# Patient Record
Sex: Male | Born: 1995 | Race: White | Hispanic: No | Marital: Single
Health system: Midwestern US, Community
[De-identification: ages and names within clinical notes are randomized; demographics above are authoritative.]

## PROBLEM LIST (undated history)

## (undated) ENCOUNTER — Emergency Department (HOSPITAL_COMMUNITY): Payer: Self-pay | Source: Home / Self Care

## (undated) DIAGNOSIS — Z87442 Personal history of urinary calculi: Secondary | ICD-10-CM

## (undated) DIAGNOSIS — F1290 Cannabis use, unspecified, uncomplicated: Secondary | ICD-10-CM

## (undated) DIAGNOSIS — M419 Scoliosis, unspecified: Secondary | ICD-10-CM

## (undated) DIAGNOSIS — N342 Other urethritis: Secondary | ICD-10-CM

## (undated) DIAGNOSIS — Q059 Spina bifida, unspecified: Secondary | ICD-10-CM

## (undated) HISTORY — PX: BLADDER SURGERY: SHX569

## (undated) HISTORY — PX: BACK SURGERY: SHX140

---
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
## 2013-10-15 ENCOUNTER — Emergency Department (HOSPITAL_COMMUNITY)
Admission: EM | Admit: 2013-10-15 | Discharge: 2013-10-15 | Disposition: A | Discharge status: Home / Self Care | Payer: 59 | Attending: Emergency Medicine | Admitting: Emergency Medicine

## 2013-10-15 ENCOUNTER — Encounter (HOSPITAL_COMMUNITY): Payer: Self-pay | Admitting: Emergency Medicine

## 2013-10-15 DIAGNOSIS — Z91040 Latex allergy status: Secondary | ICD-10-CM | POA: Insufficient documentation

## 2013-10-15 DIAGNOSIS — Z792 Long term (current) use of antibiotics: Secondary | ICD-10-CM | POA: Insufficient documentation

## 2013-10-15 DIAGNOSIS — Z8739 Personal history of other diseases of the musculoskeletal system and connective tissue: Secondary | ICD-10-CM | POA: Insufficient documentation

## 2013-10-15 DIAGNOSIS — M549 Dorsalgia, unspecified: Secondary | ICD-10-CM | POA: Insufficient documentation

## 2013-10-15 DIAGNOSIS — R1084 Generalized abdominal pain: Secondary | ICD-10-CM

## 2013-10-15 DIAGNOSIS — Q059 Spina bifida, unspecified: Secondary | ICD-10-CM | POA: Insufficient documentation

## 2013-10-15 HISTORY — DX: Scoliosis, unspecified: M41.9

## 2013-10-15 LAB — CBC WITH DIFFERENTIAL/PLATELET
Basophils Absolute: 0.1 10*3/uL (ref 0.0–0.1)
Basophils Relative: 1 % (ref 0–1)
EOS ABS: 0.2 10*3/uL (ref 0.0–0.7)
Eosinophils Relative: 3 % (ref 0–5)
HCT: 45.1 % (ref 39.0–52.0)
HEMOGLOBIN: 15.4 g/dL (ref 13.0–17.0)
LYMPHS ABS: 2.1 10*3/uL (ref 0.7–4.0)
LYMPHS PCT: 22 % (ref 12–46)
MCH: 31.4 pg (ref 26.0–34.0)
MCHC: 34.1 g/dL (ref 30.0–36.0)
MCV: 92 fL (ref 78.0–100.0)
MONOS PCT: 9 % (ref 3–12)
Monocytes Absolute: 0.8 10*3/uL (ref 0.1–1.0)
NEUTROS PCT: 65 % (ref 43–77)
Neutro Abs: 6.2 10*3/uL (ref 1.7–7.7)
Platelets: 217 10*3/uL (ref 150–400)
RBC: 4.9 MIL/uL (ref 4.22–5.81)
RDW: 13.1 % (ref 11.5–15.5)
WBC: 9.4 10*3/uL (ref 4.0–10.5)

## 2013-10-15 LAB — COMPREHENSIVE METABOLIC PANEL
ALT: 11 U/L (ref 0–53)
AST: 14 U/L (ref 0–37)
Albumin: 4.4 g/dL (ref 3.5–5.2)
Alkaline Phosphatase: 78 U/L (ref 39–117)
Anion gap: 12 (ref 5–15)
BUN: 13 mg/dL (ref 6–23)
CALCIUM: 9.7 mg/dL (ref 8.4–10.5)
CO2: 26 mEq/L (ref 19–32)
Chloride: 104 mEq/L (ref 96–112)
Creatinine, Ser: 1.16 mg/dL (ref 0.50–1.35)
GFR calc non Af Amer: >90 mL/min (ref >90)
Glucose, Bld: 91 mg/dL (ref 70–99)
Potassium: 4.6 mEq/L (ref 3.7–5.3)
SODIUM: 142 meq/L (ref 137–147)
TOTAL PROTEIN: 7.4 g/dL (ref 6.0–8.3)
Total Bilirubin: 0.9 mg/dL (ref 0.3–1.2)

## 2013-10-15 LAB — LIPASE, BLOOD: Lipase: 20 U/L (ref 11–59)

## 2013-10-15 NOTE — ED Provider Notes (Signed)
CSN: [id number]     Arrival date & time 10/15/13  1256 History   First MD Initiated Contact with Patient 10/15/13 1504     Chief Complaint  Patient presents with  . Abdominal Pain  . Back Pain     (Consider location/radiation/quality/duration/timing/severity/associated sxs/prior Treatment) HPI Comments: Patient here complaining of ongoing abdominal cramping is worse in the morning. Patient states that he has been eating and increased greasy meal. This may be the cause. No fever or chills. No vomiting or diarrhea. Symptoms have been for a week he associates this with his new job. Denies any urinary symptoms. Does not take any medication for this. Has no symptoms currently. Feels at his baseline  Patient is a 18 y.o. male presenting with abdominal pain and back pain. The history is provided by the patient.  Abdominal Pain Back Pain Associated symptoms: abdominal pain     Past Medical History  Diagnosis Date  . Spina bifida   . Scoliosis    History reviewed. No pertinent past surgical history. No family history on file. History  Substance Use Topics  . Smoking status: Never Smoker   . Smokeless tobacco: Not on file  . Alcohol Use: No    Review of Systems  Gastrointestinal: Positive for abdominal pain.  Musculoskeletal: Positive for back pain.  All other systems reviewed and are negative.     Allergies  Latex  Home Medications   Prior to Admission medications   Medication Sig Start Date End Date Taking? Authorizing Provider  amoxicillin-clavulanate (AUGMENTIN) 500-125 MG per tablet Take 1 tablet by mouth daily.    Historical Provider, MD   BP 117/71  Pulse 75  Temp(Src) 98 F (36.7 C) (Oral)  Resp 20  SpO2 98% Physical Exam  Nursing note and vitals reviewed. Constitutional: He is oriented to person, place, and time. He appears well-developed and well-nourished.  Non-toxic appearance. No distress.  HENT:  Head: Normocephalic and atraumatic.  Eyes: Conjunctivae,  EOM and lids are normal. Pupils are equal, round, and reactive to light.  Neck: Normal range of motion. Neck supple. No tracheal deviation present. No mass present.  Cardiovascular: Normal rate, regular rhythm and normal heart sounds.  Exam reveals no gallop.   No murmur heard. Pulmonary/Chest: Effort normal and breath sounds normal. No stridor. No respiratory distress. He has no decreased breath sounds. He has no wheezes. He has no rhonchi. He has no rales.  Abdominal: Soft. Normal appearance and bowel sounds are normal. He exhibits no distension. There is no tenderness. There is no rebound and no CVA tenderness.  Musculoskeletal: Normal range of motion. He exhibits no edema and no tenderness.  Neurological: He is alert and oriented to person, place, and time. He has normal strength. No cranial nerve deficit or sensory deficit. GCS eye subscore is 4. GCS verbal subscore is 5. GCS motor subscore is 6.  Skin: Skin is warm and dry. No abrasion and no rash noted.  Psychiatric: He has a normal mood and affect. His speech is normal and behavior is normal.    ED Course  Procedures (including critical care time) Labs Review Labs Reviewed  COMPREHENSIVE METABOLIC PANEL  CBC WITH DIFFERENTIAL  LIPASE, BLOOD  URINALYSIS, ROUTINE W REFLEX MICROSCOPIC    Imaging Review No results found.   EKG Interpretation None      MDM   Final diagnoses:  None    Patient with likely GERD in stable for discharge    Toy Baker, MD 10/15/13 1524

## 2013-10-15 NOTE — Discharge Instructions (Signed)

## 2013-10-15 NOTE — ED Notes (Signed)
Pt c/o back pain x 1 month and abd pain x 2 days. Pt c/o nausea but no vomiting.

## 2013-11-08 ENCOUNTER — Encounter (HOSPITAL_COMMUNITY): Payer: Self-pay | Admitting: Emergency Medicine

## 2013-11-08 ENCOUNTER — Telehealth (HOSPITAL_BASED_OUTPATIENT_CLINIC_OR_DEPARTMENT_OTHER): Payer: Self-pay | Admitting: Emergency Medicine

## 2013-11-08 ENCOUNTER — Emergency Department (HOSPITAL_COMMUNITY)
Admission: EM | Admit: 2013-11-08 | Discharge: 2013-11-08 | Disposition: A | Discharge status: Home / Self Care | Payer: 59 | Attending: Emergency Medicine | Admitting: Emergency Medicine

## 2013-11-08 DIAGNOSIS — N390 Urinary tract infection, site not specified: Secondary | ICD-10-CM | POA: Insufficient documentation

## 2013-11-08 DIAGNOSIS — Z91040 Latex allergy status: Secondary | ICD-10-CM | POA: Insufficient documentation

## 2013-11-08 DIAGNOSIS — Z8739 Personal history of other diseases of the musculoskeletal system and connective tissue: Secondary | ICD-10-CM | POA: Insufficient documentation

## 2013-11-08 DIAGNOSIS — R300 Dysuria: Secondary | ICD-10-CM | POA: Insufficient documentation

## 2013-11-08 DIAGNOSIS — Q059 Spina bifida, unspecified: Secondary | ICD-10-CM

## 2013-11-08 DIAGNOSIS — T83511A Infection and inflammatory reaction due to indwelling urethral catheter, initial encounter: Secondary | ICD-10-CM

## 2013-11-08 LAB — URINALYSIS, ROUTINE W REFLEX MICROSCOPIC
Bilirubin Urine: NEGATIVE
GLUCOSE, UA: NEGATIVE mg/dL
Hgb urine dipstick: NEGATIVE
KETONES UR: NEGATIVE mg/dL
Nitrite: POSITIVE — AB
PH: 6.5 (ref 5.0–8.0)
Protein, ur: NEGATIVE mg/dL
SPECIFIC GRAVITY, URINE: 1.023 (ref 1.005–1.030)
Urobilinogen, UA: 1 mg/dL (ref 0.0–1.0)

## 2013-11-08 LAB — URINE MICROSCOPIC-ADD ON

## 2013-11-08 MED ORDER — CEPHALEXIN 500 MG PO CAPS: 500.0000 mg | ORAL_CAPSULE | Freq: Three times a day (TID) | ORAL | Status: DC

## 2013-11-08 MED ORDER — CATHETERS KIT: PACK | Status: DC

## 2013-11-08 NOTE — ED Provider Notes (Signed)
Medical screening examination/treatment/procedure(s) were performed by non-physician practitioner and as supervising physician I was immediately available for consultation/collaboration.   EKG Interpretation None      Devoria Albe, MD, Armando Gang   Ward Givens, MD 11/08/13 2259

## 2013-11-08 NOTE — Discharge Instructions (Signed)
Catheter-Associated Urinary Tract Infection FAQs °WHAT IS "CATHETER-ASSOCIATED" URINARY TRACT INFECTION? °A urinary tract infection (also called "UTI") is an infection in the urinary system, which includes the bladder (which stores the urine) and the kidneys (which filter the blood to make urine). Germs (for example, bacteria or yeasts) do not normally live in these areas; but if germs are introduced, an infection can occur. If you have a urinary catheter, germs can travel along the catheter and cause an infection in your bladder or your kidney; in that case it is called a catheter-associated urinary tract infection (or "CA-UTI").  °WHAT IS A URINARY CATHETER? °A urinary catheter is a thin tube placed in the bladder to drain urine. Urine drains through the tube into a bag that collects the urine. A urinary  °catheter may be used: °· If you are not able to urinate on your own. °· To measure the amount of urine that you make, for example, during intensive care. °· During and after some types of surgery. °· During some tests of the kidneys and bladder . °People with urinary catheters have a much higher chance of getting a urinary tract infection than people who don't have a catheter. °HOW DO I GET A CATHETER-ASSOCIATED URINARY TRACT INFECTION (CA-UTI)? °If germs enter the urinary tract, they may cause an infection. Many of the germs that cause a catheter-associated urinary tract infection are common germs found in your intestines that do not usually cause an infection there. Germs can enter the urinary tract when the catheter is being put in or while the catheter remains in the bladder.  °WHAT ARE THE SYMPTOMS OF A URINARY TRACT INFECTION?  °Some of the common symptoms of a urinary tract infection are: °· Burning or pain in the lower abdomen (that is, below the stomach). °· Fever. °· Bloody urine may be a sign of infection, but is also caused by other problems . °· Burning during urination or an increase in the  frequency of urination after the catheter is removed. °Sometimes people with catheter-associated urinary tract infections do not have these symptoms of infection. °CAN CATHETER-ASSOCIATED URINARY TRACT INFECTIONS BE TREATED? °Yes, most catheter-associated urinary tract infections can be treated with antibiotics and removal or change of the catheter. Your doctor will determine which antibiotic is best for you.  °WHAT ARE SOME OF THE THINGS THAT HOSPITALS ARE DOING TO PREVENT CATHETER-ASSOCIATED URINARY TRACT INFECTIONS? °To prevent urinary tract infections, doctors and nurses take the following actions.  °Catheter insertion °· Catheters are put in only when necessary and they are removed as soon as possible. °· Only properly trained persons insert catheters using sterile ("clean") technique. °· The skin in the area where the catheter will be inserted is cleaned before inserting the catheter. °· Other methods to drain the urine are sometimes used, such as: °¨ External catheters in men (these look like condoms and are placed over the penis rather than into the penis) °¨ Putting a temporary catheter in to drain the urine and removing it right away. This is called intermittent urethral catheterization. °Catheter care °· Healthcare providers clean their hands by washing them with soap and water or using an alcohol-based hand rub before and after touching your catheter. °¨ If you do not see your providers clean their hands, please ask them to do so. °· Avoid disconnecting the catheter and drain tube. This helps to prevent germs from getting into the catheter tube. °· The catheter is secured to the leg to prevent pulling on the   catheter.  Avoid twisting or kinking the catheter.  Keep the bag lower than the bladder to prevent urine from backflowing to the bladder.  Empty the bag regularly. The drainage spout should not touch anything while emptying the bag. WHAT CAN I DO TO HELP PREVENT CATHETER-ASSOCIATED URINARY  TRACT INFECTIONS IF I HAVE A CATHETER?  Always clean your hands before and after doing catheter care.  Always keep your urine bag below the level of your bladder.  Do not tug or pull on the tubing.  Do not twist or kink the catheter tubing.  Ask your healthcare provider each day if you still need the catheter. WHAT DO I NEED TO DO WHEN I GO HOME FROM THE HOSPITAL?  If you will be going home with a catheter, your doctor or nurse should explain everything you need to know about taking care of the catheter. Make sure you understand how to care for it before you leave the hospital.  If you develop any of the symptoms of a urinary tract infection, such as burning or pain in the lower abdomen, fever, or an increase in the frequency of urination, contact your doctor or nurse immediately.  Before you go home, make sure you know who to contact if you have questions or problems after you get home. If you have questions, please ask your doctor or nurse. Developed and co-sponsored by [employer] for [employer] of [place] [phone]); Infectious Diseases Society of America (IDSA); The [facility] Association; Association for Professionals in Infection Control and Epidemiology (APIC); Center for Disease Control (CDC); and The Joint Commission Document Released: 10/28/2011 Document Reviewed: 10/28/2011 [facility] Patient Information 2015 [place], [place]. This information is not intended to replace advice given to you by your health care provider. Make sure you discuss any questions you have with your health care provider.   Emergency Department Resource Guide 1) Find a Doctor and Pay Out of Pocket Although you won't have to find out who is covered by your insurance plan, it is a good idea to ask around and get recommendations. You will then need to call the office and see if the doctor you have chosen will accept you as a new patient and what types of options they offer for patients who are  self-pay. Some doctors offer discounts or will set up payment plans for their patients who do not have insurance, but you will need to ask so you aren't surprised when you get to your appointment.  2) Contact Your Local Health Department Not all health departments have doctors that can see patients for sick visits, but many do, so it is worth a call to see if yours does. If you don't know where your local health department is, you can check in your phone book. The CDC also has a tool to help you locate your state's health department, and many state websites also have listings of all of their local health departments.  3) Find a Walk-in Clinic If your illness is not likely to be very severe or complicated, you may want to try a walk in clinic. These are popping up all over the country in pharmacies, drugstores, and shopping centers. They're usually staffed by nurse practitioners or physician assistants that have been trained to treat common illnesses and complaints. They're usually fairly quick and inexpensive. However, if you have serious medical issues or chronic medical problems, these are probably not your best option.  No Primary Care Doctor: - Call Health Connect at  [phone] -  they can help you locate a primary care doctor that  accepts your insurance, provides certain services, etc. - Physician Referral Service- [phone]  Chronic Pain Problems: Organization         Address  Phone   Notes  [facility]  [phone] Patients need to be referred by their primary care doctor.   Medication Assistance: Organization         Address  Phone   Notes  [facility] Medication [facility] [address] [place] [place], [place] [address] [phone] --Must be a resident of [facility] -- Must have NO insurance coverage whatsoever (no Medicaid/ Medicare, etc.) -- The pt. MUST have a primary care doctor that directs their care regularly and follows them in  the community   [facility]  [phone]   [employer]  [phone]    Agencies that provide inexpensive medical care: Organization         Address  Phone   Notes  [facility]  [phone]   [facility]    [phone]   [facility] [address] [place], [place] [address] [phone]   [facility] [address], [place] [phone]   [facility]anned Parenthood    [phone]   [facility]    [phone]   [facility] and [facility]  [address], [place] Phone:  [phone], Fax:  [phone] Hours of Operation:  9 am - 6 pm, M-F.  Also accepts Medicaid/Medicare and self-pay.  [facility]  [address], [place] Phone: [phone], Fax: [phone]. Hours of Operation:  8:30 am - 5:30 pm, M-F.  Also accepts Medicaid and self-pay.  [facility] [place] [address] [place] Phone: [phone]   [facility] [address] Natasha Bence [place], [place] [phone], Ext. [phone] Mondays & Thursdays: 7-9 AM.  First 15 patients are seen on a first come, first serve basis.    Medicaid-accepting [facility] Providers:  Organization         Address  Phone   Notes  [facility] [address], [place] [phone] Also accepts self-pay patients.  [facility] [address] Laurell Josephs [place], [place]  [phone]   [facility] [address], [place] [phone]   [facility] [address] [place] [phone]   Renaye Rakers [address], [place]   [phone] Only accepts [place] Access [place] patients after they have their name applied to their card.   Self-Pay (no insurance) in [facility]:  Organization         Address  Phone   Notes  Sickle Cell Patients,  [facility] [address] [place], [place] [phone]   [facility] [address] [place], [place] [phone]   [facility] [place]  [address], [place] [phone]   [facility]/Dr. Osei-Bonsu  [address], [place] or [address], [place] [phone] Phone number for both [place] and [place] locations is the same.  [facility] [address] [place] [phone]   [facility] [address] [place] or [place]  [facility] [address] [phone]   [facility] [address] [place], [place] [phone], phone; [phone], fax Sees patients 1st and 3rd Saturday of every month.  Must not qualify for public or private insurance (i.e. Medicaid, Medicare, [place] Health Choice, Veterans' Benefits)  Household income should be no more than 200% of the poverty level The clinic cannot treat you if you are pregnant or think you are pregnant  Sexually transmitted diseases are not treated at the clinic.    Dental Care: Organization         Address  Phone  Notes  [facility] [address] [place], [place] [phone] Accepts children up to age 43 who are enrolled in [place] or [employer] Health Choice; pregnant women with a Medicaid card; and children who have applied for Medicaid or [place] Health Choice, but were declined, whose parents can pay a reduced fee at time of service.  [facility]  [address], [place] [phone] Accepts children up to age 31 who are enrolled in [place] or [place] Health Choice; pregnant women with a Medicaid card; and children who have applied for Medicaid or  Health Choice, but were declined, whose parents can pay a reduced fee at time of service.  [facility]   [address] [place], [place] [phone] Patients are seen by appointment only. Walk-ins are not accepted. [facility] will see patients 40 years of age and older. Monday - Tuesday (8am-5pm) Most Wednesdays (8:30-5pm) $30 per visit, cash only  [facility] Adult Dental Access PRO[facility]  [address], [facility] [phone] Patients are seen by appointment only. Walk-ins are not accepted. [facility] will see patients 69 years of age and older. One Wednesday Evening (Monthly: Volunteer Based).  $30 per visit, cash only  [employer]  [phone] for adults; Children under age 76, call Graduate Pediatric Dentistry at [phone]. Children aged 49-14, please call [phone] to request a pediatric application.  Dental services are provided in all areas of dental care including fillings, crowns and bridges, complete and partial dentures, implants, gum treatment, root canals, and extractions. Preventive care is also provided. Treatment is provided to both adults and children. Patients are selected via a lottery and there is often a waiting list.   [facility] [address], [place]  [phone] [email or web address]   [facility] [address] [place], [place] [phone], Ext. [phone] Second and Fourth Thursday of each month, opens at 6:30 AM; Clinic ends at 9 AM.  Patients are seen on a first-come first-served basis, and a limited number are seen during each clinic.   [facility]  [address] Ether Griffins [place], [place] [phone]   Eligibility Requirements You must have lived in [place], [place], or [place] counties for at least the last three months.   You cannot be eligible for state or federal sponsored [place], including [employer], [place], or [employer].   You generally cannot be eligible for healthcare insurance through your employer.    How to apply: Eligibility screenings are  held every Tuesday and Wednesday afternoon from 1:00 pm until 4:00 pm. You do not need an appointment for the interview!  [facility] [address] [place], [place] [phone]   [facility] Health Department  [phone]   [facility]  Health Department  [phone]   [facility] Health Department  [phone]    Behavioral Health Resources in the Community: Intensive Outpatient Programs Organization         Address  Phone  Notes  [facility] Services [address], [place], [place] [phone]   [facility] Outpatient [address], [place], [place] [phone]   [facility] [address] [place], [place]  [phone]   [facility] Mental Health [address],  [place], [place] [phone] or [phone]   Substance Abuse Resources Organization         Address  Phone  Notes  [facility] Drug Services  [phone]   [facility]  [phone]   [facility]  [phone]   Floydene Flock  [phone]   Residential & Outpatient Substance Abuse Program  [phone]   Psychological Services Organization         Address  Phone  Notes  [facility] Behavioral Health  [phone]   [facility] Services  [phone]   [facility] Mental Health [address], [place] [phone] or [phone]    Mobile Crisis Teams Organization         Address  Phone  Notes  [facility], Mobile Crisis Care Unit  [phone]   [facility]  [address] [place], [place] [phone]   Doristine Locks [address] [place] [phone]    Self-Help/Support Groups Organization         Address  Phone             Notes  [facility] [place] - variety of support groups  [phone]- I7437963 Call for more information  Narcotics Anonymous (NA), [facility] [address], [employer] [place]  2 meetings at this location     Statistician         Address  Phone  Notes  [facility] [address],    [place]  [phone]   [facility]  [address], [place] [address], [place], [place] [phone]   [facility] Treatment Facility [address] [place], [place] [phone] Admissions: 8am-3pm M-F  [facility] [address]    [place], [place] [phone]   [facility] [address] [place], [place], [place] [phone]   [facility] [address],  [place], [place] [phone]   [facility] - Intensive Outpatient [address] Laurell Josephs [address], [place], [place] [phone]   [facility] ([facility]iction [facility] [address] [place].,  [place], [place] [phone] or [phone]   [facility] ([facility]) [address] [place], [place] [phone] Accepts Medicaid  [facility] [place] [address]  [place] [phone] Substance Abuse/Addiction Treatment   [facility] Organization         Address  Phone  Notes  [facility]  [phone]   Angie Fava, PhD [address] Ervin Knack [place], [place]   [phone] or [phone]   [facility] Behavioral   [address] [place], [place] [phone]   [facility] [address] [place], [place] [phone] Insurance/Medicaid/sponsorship through [employer] and Families [address]  [place], [place] [phone] [place], [place] [phone]    Dr. Adele Schilder  [phone]   [facility] Dept. 1) [address], [place] 2) [place] 3)  [place] [address], [place] [phone]  [phone]   [place] [phone] or [phone] (After  Hours)

## 2013-11-08 NOTE — ED Notes (Signed)
Pt states that he self caths and he lost his last catheter.  Needs size 12 F catheter.

## 2013-11-08 NOTE — ED Provider Notes (Signed)
CSN: [id number]     Arrival date & time 11/08/13  1001 History   First MD Initiated Contact with Patient 11/08/13 1021     Chief Complaint  Patient presents with  . Needs a Cath    . Dysuria   HPI  Patient is a 18 y.o. Male with Spina Bifida who presents to the ED with need for catheterization kit.  Per the patient he has been self cathing for as long as he can remember due to his spina bifida.  Patient states that he ran out of catheters and did not have any more and that is why he had to come here.  Patient states that he has been reusing catheters for the past couple weeks because he was out.  Patient thinks that he has a UTI because he has a very foul odor to his urine and he states that it is cloudy and much thicker than normal.  Patient states that he gets UTIs often.    Past Medical History  Diagnosis Date  . Spina bifida   . Scoliosis    No past surgical history on file. No family history on file. History  Substance Use Topics  . Smoking status: Never Smoker   . Smokeless tobacco: Not on file  . Alcohol Use: No    Review of Systems  Constitutional: Negative for fever, chills and fatigue.  Gastrointestinal: Negative for nausea, vomiting, abdominal pain and diarrhea.  Genitourinary: Negative for urgency, frequency, hematuria, flank pain and difficulty urinating.  Skin: Negative for rash and wound.  All other systems reviewed and are negative.   Allergies  Latex  Home Medications   Prior to Admission medications   Medication Sig Start Date End Date Taking? Authorizing Provider  Catheters KIT Provide 21 self catheterization kits for PRN catheterization 11/08/13   Myrick Mcnairy A Forcucci, PA-C  cephALEXin (KEFLEX) 500 MG capsule Take 1 capsule (500 mg total) by mouth 3 (three) times daily. 11/08/13   Kostantinos Tallman A Forcucci, PA-C   BP 110/54  Pulse 60  Temp(Src) 98 F (36.7 C) (Oral)  Resp 14  SpO2 98% Physical Exam  Nursing note and vitals reviewed. Constitutional: He is  oriented to person, place, and time. He appears well-developed and well-nourished. No distress.  HENT:  Head: Normocephalic and atraumatic.  Mouth/Throat: Oropharynx is clear and moist. No oropharyngeal exudate.  Eyes: Conjunctivae and EOM are normal. Pupils are equal, round, and reactive to light. No scleral icterus.  Neck: Normal range of motion. Neck supple. No JVD present. No thyromegaly present.  Cardiovascular: Normal rate, regular rhythm, normal heart sounds and intact distal pulses.  Exam reveals no gallop and no friction rub.   No murmur heard. Pulmonary/Chest: Effort normal and breath sounds normal. No respiratory distress. He has no wheezes. He has no rales. He exhibits no tenderness.  Abdominal: Soft. Bowel sounds are normal. He exhibits no distension and no mass. There is no tenderness. There is no rebound and no guarding.  Lymphadenopathy:    He has no cervical adenopathy.  Neurological: He is alert and oriented to person, place, and time.  Skin: Skin is warm and dry. He is not diaphoretic.  Psychiatric: He has a normal mood and affect. His behavior is normal. Judgment and thought content normal.    ED Course  Procedures (including critical care time) Labs Review Labs Reviewed  URINALYSIS, ROUTINE W REFLEX MICROSCOPIC - Abnormal; Notable for the following:    APPearance CLOUDY (*)    Nitrite POSITIVE (*)  Leukocytes, UA MODERATE (*)    All other components within normal limits  URINE MICROSCOPIC-ADD ON - Abnormal; Notable for the following:    Bacteria, UA MANY (*)    All other components within normal limits  URINE CULTURE    Imaging Review No results found.   EKG Interpretation None      MDM   Final diagnoses:  Spina bifida  Urinary tract infection associated with catheterization of urinary tract, initial encounter   Patient is a 18 y.o. Male who presents to the ED with need for catheterization kits.  Physical exam unremarkable.  UA shows UTI.  Have  sent urine for culture.  Will place patient on Keflex 500 TID x 7 days.  Patient was also given a prescription for self catheterization kits.  Patient [facility] be [facility] to [facility] community health and wellness.  Patient to return to the ED for Pyelonephritis symptoms.  Patient states understanding and agreement at this time. Patient is stable for discharge.        Cherylann Parr, PA-C 11/08/13 1811

## 2013-11-10 LAB — URINE CULTURE
Colony Count: >=100000
Special Requests: NORMAL

## 2013-11-12 ENCOUNTER — Telehealth (HOSPITAL_BASED_OUTPATIENT_CLINIC_OR_DEPARTMENT_OTHER): Payer: Self-pay

## 2013-11-12 NOTE — Telephone Encounter (Signed)
Post ED Visit - Positive Culture Follow-up  Culture report reviewed by antimicrobial stewardship pharmacist:  Wes Dulaney, Pharm.D., BCPS  Celedonio Miyamoto, Pharm.D., BCPS  Georgina Pillion, [address].D., BCPS  [place], [address].D., BCPS, AAHIVP  Estella Husk, Pharm.D., BCPS, AAHIVP  Carly Sabat, Pharm.D.  Enzo Bi, [address].D.  Positive Urine culture, >/= 100,000 colonies -> Klebsiella Pneumoniae Treated with Cephalexin, organism sensitive to the same and no further patient follow-up is required at this time.  Arvid Right 11/12/2013, 1:19 AM

## 2014-11-29 ENCOUNTER — Encounter (HOSPITAL_COMMUNITY): Payer: Self-pay | Admitting: Emergency Medicine

## 2014-11-29 ENCOUNTER — Emergency Department (HOSPITAL_COMMUNITY)
Admission: EM | Admit: 2014-11-29 | Discharge: 2014-11-29 | Disposition: A | Discharge status: Home / Self Care | Payer: 59 | Attending: Emergency Medicine | Admitting: Emergency Medicine

## 2014-11-29 DIAGNOSIS — M419 Scoliosis, unspecified: Secondary | ICD-10-CM | POA: Insufficient documentation

## 2014-11-29 DIAGNOSIS — Q059 Spina bifida, unspecified: Secondary | ICD-10-CM | POA: Insufficient documentation

## 2014-11-29 DIAGNOSIS — N10 Acute tubulo-interstitial nephritis: Secondary | ICD-10-CM | POA: Insufficient documentation

## 2014-11-29 DIAGNOSIS — Z91040 Latex allergy status: Secondary | ICD-10-CM | POA: Insufficient documentation

## 2014-11-29 DIAGNOSIS — Z880 Allergy status to penicillin: Secondary | ICD-10-CM | POA: Insufficient documentation

## 2014-11-29 DIAGNOSIS — N12 Tubulo-interstitial nephritis, not specified as acute or chronic: Secondary | ICD-10-CM

## 2014-11-29 DIAGNOSIS — Z792 Long term (current) use of antibiotics: Secondary | ICD-10-CM | POA: Insufficient documentation

## 2014-11-29 LAB — CBC WITH DIFFERENTIAL/PLATELET
BASOS ABS: 0.1 10*3/uL (ref 0.0–0.1)
BASOS PCT: 1 %
Eosinophils Absolute: 0.1 10*3/uL (ref 0.0–0.7)
Eosinophils Relative: 1 %
HEMATOCRIT: 45.6 % (ref 39.0–52.0)
HEMOGLOBIN: 15.7 g/dL (ref 13.0–17.0)
LYMPHS PCT: 22 %
Lymphs Abs: 2.2 10*3/uL (ref 0.7–4.0)
MCH: 31.5 pg (ref 26.0–34.0)
MCHC: 34.4 g/dL (ref 30.0–36.0)
MCV: 91.6 fL (ref 78.0–100.0)
Monocytes Absolute: 0.8 10*3/uL (ref 0.1–1.0)
Monocytes Relative: 8 %
NEUTROS ABS: 6.9 10*3/uL (ref 1.7–7.7)
Neutrophils Relative %: 68 %
Platelets: 229 10*3/uL (ref 150–400)
RBC: 4.98 MIL/uL (ref 4.22–5.81)
RDW: 13.2 % (ref 11.5–15.5)
WBC: 10.1 10*3/uL (ref 4.0–10.5)

## 2014-11-29 LAB — I-STAT CHEM 8, ED
BUN: 18 mg/dL (ref 6–20)
CHLORIDE: 101 mmol/L (ref 101–111)
CREATININE: 1.1 mg/dL (ref 0.61–1.24)
Calcium, Ion: 1.19 mmol/L (ref 1.12–1.23)
GLUCOSE: 74 mg/dL (ref 65–99)
HEMATOCRIT: 48 % (ref 39.0–52.0)
HEMOGLOBIN: 16.3 g/dL (ref 13.0–17.0)
POTASSIUM: 3.4 mmol/L — AB (ref 3.5–5.1)
Sodium: 143 mmol/L (ref 135–145)
TCO2: 28 mmol/L (ref 0–100)

## 2014-11-29 LAB — URINE MICROSCOPIC-ADD ON

## 2014-11-29 LAB — URINALYSIS, ROUTINE W REFLEX MICROSCOPIC
BILIRUBIN URINE: NEGATIVE
Glucose, UA: NEGATIVE mg/dL
Hgb urine dipstick: NEGATIVE
KETONES UR: NEGATIVE mg/dL
NITRITE: POSITIVE — AB
Protein, ur: NEGATIVE mg/dL
Specific Gravity, Urine: 1.029 (ref 1.005–1.030)
UROBILINOGEN UA: 1 mg/dL (ref 0.0–1.0)
pH: 6.5 (ref 5.0–8.0)

## 2014-11-29 MED ORDER — ONDANSETRON 8 MG PO TBDP: 8.0000 mg | ORAL_TABLET | Freq: Three times a day (TID) | ORAL | Status: DC | PRN

## 2014-11-29 MED ORDER — TRAMADOL HCL 50 MG PO TABS: 50.0000 mg | ORAL_TABLET | Freq: Four times a day (QID) | ORAL | Status: DC | PRN

## 2014-11-29 MED ORDER — STERILE WATER FOR INJECTION IJ SOLN
INTRAMUSCULAR | Status: AC
  Administered 2014-11-29: 1 mL
  Filled 2014-11-29: qty 10

## 2014-11-29 MED ORDER — CEFTRIAXONE SODIUM 1 G IJ SOLR
1.0000 g | Freq: Once | INTRAMUSCULAR | Status: AC
  Administered 2014-11-29: 1 g via INTRAMUSCULAR
  Filled 2014-11-29: qty 10

## 2014-11-29 MED ORDER — CEPHALEXIN 500 MG PO CAPS: 500.0000 mg | ORAL_CAPSULE | Freq: Four times a day (QID) | ORAL | Status: DC

## 2014-11-29 NOTE — ED Provider Notes (Signed)
CSN: [id number]     Arrival date & time 11/29/14  1828 History   First MD Initiated Contact with Patient 11/29/14 1920     Chief Complaint  Patient presents with  . Flank Pain     (Consider location/radiation/quality/duration/timing/severity/associated sxs/prior Treatment) HPI Riley Simmons is a 19 y.o. male with history of spina bifida, presents to emergency department complaining of dysuria, urinary frequency, left flank pain. Patient states symptoms started several days ago. 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 Reports he has not had a UTI in about a year. He states this feels similar to his usual urinary tract infection. He denies any fever or chills. Reports some nausea, denies any vomiting. Denies any abdominal pain. Did not try any medications prior to coming in.  Past Medical History  Diagnosis Date  . Spina bifida ([place])   . Scoliosis    History reviewed. No pertinent past surgical history. No family history on file. Social History  Substance Use Topics  . Smoking status: Never Smoker   . Smokeless tobacco: None  . Alcohol Use: No    Review of Systems  Constitutional: Negative for fever and chills.  Respiratory: Negative for cough, chest tightness and shortness of breath.   Cardiovascular: Negative for chest pain, palpitations and leg swelling.  Gastrointestinal: Positive for nausea. Negative for vomiting, abdominal pain, diarrhea and abdominal distention.  Genitourinary: Positive for dysuria, urgency and flank pain. Negative for hematuria, discharge and penile pain.  Musculoskeletal: Negative for myalgias, arthralgias, neck pain and neck stiffness.  Skin: Negative for rash.  Allergic/Immunologic: Negative for immunocompromised state.  Neurological: Negative for dizziness, weakness, light-headedness, numbness and headaches.  All other systems reviewed and are negative.     Allergies   Amoxicillin and Latex  Home Medications   Prior to Admission medications   Medication Sig Start Date End Date Taking? Authorizing Provider  Catheters KIT Provide 21 self catheterization kits for PRN catheterization 11/08/13  Yes Courtney Forcucci, PA-C  ibuprofen (ADVIL,MOTRIN) 200 MG tablet Take 400-600 mg by mouth daily as needed for headache or moderate pain.   Yes Historical Provider, MD  cephALEXin (KEFLEX) 500 MG capsule Take 1 capsule (500 mg total) by mouth 4 (four) times daily. 11/29/14   Latisia Hilaire, PA-C  ondansetron (ZOFRAN ODT) 8 MG disintegrating tablet Take 1 tablet (8 mg total) by mouth every 8 (eight) hours as needed for nausea or vomiting. 11/29/14   Wilmer Berryhill, PA-C  traMADol (ULTRAM) 50 MG tablet Take 1 tablet (50 mg total) by mouth every 6 (six) hours as needed. 11/29/14   Yvonne Petite, PA-C   BP 136/80 mmHg  Pulse 81  Temp(Src) 98.1 F (36.7 C) (Oral)  SpO2 100% Physical Exam  Constitutional: He appears well-developed and well-nourished. No distress.  HENT:  Head: Normocephalic and atraumatic.  Eyes: Conjunctivae are normal.  Neck: Neck supple.  Cardiovascular: Normal rate, regular rhythm and normal heart sounds.   Pulmonary/Chest: Effort normal. No respiratory distress. He has no wheezes. He has no rales.  Abdominal: Soft. Bowel sounds are normal. He exhibits no distension. There is no tenderness. There is no rebound.  Left CVA tenderness  Musculoskeletal: He exhibits no edema.  Neurological: He is alert.  Skin: Skin is warm and dry.  Nursing note and vitals reviewed.   ED Course  Procedures (including critical care time) Labs Review Labs Reviewed  URINALYSIS, ROUTINE W REFLEX MICROSCOPIC (NOT  AT [facility]) - Abnormal; Notable for the following:    APPearance CLOUDY (*)    Nitrite POSITIVE (*)    Leukocytes, UA SMALL (*)    All other components within normal limits  URINE MICROSCOPIC-ADD ON - Abnormal; Notable for the following:     Bacteria, UA MANY (*)    All other components within normal limits  I-STAT CHEM 8, ED - Abnormal; Notable for the following:    Potassium 3.4 (*)    All other components within normal limits  URINE CULTURE  CBC WITH DIFFERENTIAL/PLATELET    Imaging Review No results found. I have personally reviewed and evaluated these images and lab results as part of my medical decision-making.   EKG Interpretation None      MDM   Final diagnoses:  Pyelonephritis    Patient with left flank pain, dysuria, urinary urgency. He does in and out. Last infection a year ago. History spina bifida. Urine showing infection. Culture sent. Will discharge home with Keflex, Zofran, tramadol. Follow up with primary care doctor. Patient is afebrile, nontoxic appearing or otherwise. Labs unremarkable. Instructed to return if worsening symptoms.  Filed Vitals:   11/29/14 1835 11/29/14 2028  BP: 136/80 132/72  Pulse: 81 66  Temp: 98.1 F (36.7 C) 97.8 F (36.6 C)  TempSrc: Oral   Resp:  18  SpO2: 100% 96%       Jeannett Senior, PA-C 11/29/14 2033  Charlesetta Shanks, MD 11/29/14 2348

## 2014-11-29 NOTE — ED Notes (Signed)
Notified EDP,Pheiffer,MD., pt. i-stat Chem 8 results potassium 3.4 and RN, Harriett Sineerrance made aware.

## 2014-11-29 NOTE — Discharge Instructions (Signed)
Take Keflex as prescribed until all gone for infection. Tylenol for pain. Tramadol for severe pain. Zofran for nausea as needed. Follow up with primary care doctor or return if worsening.    Pyelonephritis, Adult Pyelonephritis is a kidney infection. The kidneys are the organs that filter a person's blood and move waste out of the bloodstream and into the urine. Urine passes from the kidneys, through the ureters, and into the bladder. There are two main types of pyelonephritis:  Infections that come on quickly without any warning (acute pyelonephritis).  Infections that last for a long period of time (chronic pyelonephritis). In most cases, the infection clears up with treatment and does not cause further problems. More severe infections or chronic infections can sometimes spread to the bloodstream or lead to other problems with the kidneys. CAUSES This condition is usually caused by:  Bacteria traveling from the bladder to the kidney through infected urine. The urine in the bladder can become infected with bacteria from:  Bladder infection (cystitis).  Inflammation of the prostate gland (prostatitis).  Sexual intercourse, in females.  Bacteria traveling from the bloodstream to the kidney. RISK FACTORS This condition is more likely to develop in:  Pregnant women.  Older people.  People who have diabetes.  People who have kidney stones or bladder stones.  People who have other abnormalities of the kidney or ureter.  People who have a catheter placed in the bladder.  People who have cancer.  People who are sexually active.  Women who use spermicides.  People who have had a prior urinary tract infection. SYMPTOMS Symptoms of this condition include:  Frequent urination.  Strong or persistent urge to urinate.  Burning or stinging when urinating.  Abdominal pain.  Back pain.  Pain in the side or flank area.  Fever.  Chills.  Blood in the urine, or dark  urine.  Nausea.  Vomiting. DIAGNOSIS This condition may be diagnosed based on:  Medical history and physical exam.  Urine tests.  Blood tests. You may also have imaging tests of the kidneys, such as an ultrasound or CT scan. TREATMENT Treatment for this condition may depend on the severity of the infection.  If the infection is mild and is found early, you may be treated with antibiotic medicines taken by mouth. You will need to drink fluids to remain hydrated.  If the infection is more severe, you may need to stay in the hospital and receive antibiotics given directly into a vein through an IV tube. You may also need to receive fluids through an IV tube if you are not able to remain hydrated. After your hospital stay, you may need to take oral antibiotics for a period of time. Other treatments may be required, depending on the cause of the infection. HOME CARE INSTRUCTIONS Medicines  Take over-the-counter and prescription medicines only as told by your health care provider.  If you were prescribed an antibiotic medicine, take it as told by your health care provider. Do not stop taking the antibiotic even if you start to feel better. General Instructions  Drink enough fluid to keep your urine clear or pale yellow.  Avoid caffeine, tea, and carbonated beverages. They tend to irritate the bladder.  Urinate often. Avoid holding in urine for long periods of time.  Urinate before and after sex.  After a bowel movement, women should cleanse from front to back. Use each tissue only once.  Keep all follow-up visits as told by your health care provider. This is  important. SEEK MEDICAL CARE IF:  Your symptoms do not get better after 2 days of treatment.  Your symptoms get worse.  You have a fever. SEEK IMMEDIATE MEDICAL CARE IF:  You are unable to take your antibiotics or fluids.  You have shaking chills.  You vomit.  You have severe flank or back pain.  You have  extreme weakness or fainting.   This information is not intended to replace advice given to you by your health care provider. Make sure you discuss any questions you have with your health care provider.   Document Released: 02/02/2005 Document Revised: 10/24/2014 Document Reviewed: 05/28/2014 Elsevier Interactive Patient Education [employer]

## 2014-11-29 NOTE — ED Notes (Signed)
Pt c/o left flank pain x1 day with urinary frequency, hesitation. Denies dysuria or hematuria. Rates pain 8/10.

## 2014-12-02 LAB — URINE CULTURE

## 2014-12-03 ENCOUNTER — Telehealth (HOSPITAL_BASED_OUTPATIENT_CLINIC_OR_DEPARTMENT_OTHER): Payer: Self-pay | Admitting: Emergency Medicine

## 2014-12-03 NOTE — Telephone Encounter (Signed)
Post ED Visit - Positive Culture Follow-up  Culture report reviewed by antimicrobial stewardship pharmacist:  []  Celedonio MiyamotoJeremy Frens, Pharm.D., BCPS []  Georgina PillionElizabeth Martin, [address] BCPS []  BentonMinh Pham, [address]Ph[address]., BCPS, AAHIVP []  Estella HuskMichelle Turner, Pharm.D., BCPS, AAHIVP []  Almaristy Reyes, [address] []  Tennis Mustassie Stewart, [place]. Lisette GrinderAlyson Leonard PharmD  Positive urine culture E. coli Treated with cephalexin, organism sensitive to the same and no further patient follow-up is required at this time.  Berle MullMiller, Zineb Glade 12/03/2014, 9:19 AM

## 2014-12-12 ENCOUNTER — Telehealth (HOSPITAL_BASED_OUTPATIENT_CLINIC_OR_DEPARTMENT_OTHER): Payer: Self-pay | Admitting: Emergency Medicine

## 2015-01-26 ENCOUNTER — Encounter (HOSPITAL_COMMUNITY): Payer: Self-pay | Admitting: *Deleted

## 2015-01-26 ENCOUNTER — Emergency Department (HOSPITAL_COMMUNITY)
Admission: EM | Admit: 2015-01-26 | Discharge: 2015-01-26 | Disposition: A | Discharge status: Home / Self Care | Payer: 59 | Attending: Emergency Medicine | Admitting: Emergency Medicine

## 2015-01-26 DIAGNOSIS — Q059 Spina bifida, unspecified: Secondary | ICD-10-CM | POA: Insufficient documentation

## 2015-01-26 DIAGNOSIS — N390 Urinary tract infection, site not specified: Secondary | ICD-10-CM | POA: Insufficient documentation

## 2015-01-26 DIAGNOSIS — Z91040 Latex allergy status: Secondary | ICD-10-CM | POA: Insufficient documentation

## 2015-01-26 DIAGNOSIS — Z880 Allergy status to penicillin: Secondary | ICD-10-CM | POA: Insufficient documentation

## 2015-01-26 DIAGNOSIS — Z792 Long term (current) use of antibiotics: Secondary | ICD-10-CM | POA: Insufficient documentation

## 2015-01-26 DIAGNOSIS — M419 Scoliosis, unspecified: Secondary | ICD-10-CM | POA: Insufficient documentation

## 2015-01-26 LAB — URINE MICROSCOPIC-ADD ON

## 2015-01-26 LAB — URINALYSIS, ROUTINE W REFLEX MICROSCOPIC
Bilirubin Urine: NEGATIVE
Glucose, UA: NEGATIVE mg/dL
Hgb urine dipstick: NEGATIVE
Ketones, ur: NEGATIVE mg/dL
NITRITE: NEGATIVE
Protein, ur: NEGATIVE mg/dL
SPECIFIC GRAVITY, URINE: 1.025 (ref 1.005–1.030)
pH: 8 (ref 5.0–8.0)

## 2015-01-26 MED ORDER — CEPHALEXIN 250 MG PO CAPS: 250.0000 mg | ORAL_CAPSULE | Freq: Four times a day (QID) | ORAL | Status: DC

## 2015-01-26 MED ORDER — OXYBUTYNIN CHLORIDE ER 10 MG PO TB24: 10.0000 mg | ORAL_TABLET | Freq: Every day | ORAL | Status: DC

## 2015-01-26 NOTE — Discharge Instructions (Signed)
Catheter-Associated Urinary Tract Infection FAQs  What is "catheter-associated urinary tract infection"?  A urinary tract infection (also called "UTI") is an infection in the urinary system, which includes the bladder (which stores the urine) and the kidneys (which filter the blood to make urine). Germs (for example, bacteria or yeasts) do not normally live in these areas; but if germs are introduced, an infection can occur.  If you have a urinary catheter, germs can travel along the catheter and cause an infection in your bladder or your kidney; in that case it is called a catheter-associated urinary tract infection (or "CA-UTI").   What is a urinary catheter?  A urinary catheter is a thin tube placed in the bladder to drain urine. Urine drains through the tube into a bag that collects the urine. A urinary catheter may be used:  · If you are not able to urinate on your own  · To measure the amount of urine that you make, for example, during intensive care  · During and after some types of surgery  · During some tests of the kidneys and bladder  People with urinary catheters have a much higher chance of getting a urinary tract infection than people who don't have a catheter.  How do I get a catheter-associated urinary tract infection (CA-UTI)?  If germs enter the urinary tract, they may cause an infection. Many of the germs that cause a catheter-associated urinary tract infection are common germs found in your intestines that do not usually cause an infection there. Germs can enter the urinary tract when the catheter is being put in or while the catheter remains in the bladder.   What are the symptoms of a urinary tract infection?  Some of the common symptoms of a urinary tract infection are:  · Burning or pain in the lower abdomen (that is, below the stomach)  · Fever  · Bloody urine may be a sign of infection, but is also caused by other problems  · Burning during urination or an increase in the frequency of  urination after the catheter is removed.  Sometimes people with catheter-associated urinary tract infections do not have these symptoms of infection.  Can catheter-associated urinary tract infections be treated?  Yes, most catheter-associated urinary tract infections can be treated with antibiotics and removal or change of the catheter. Your doctor will determine which antibiotic is best for you.   What are some of the things that hospitals are doing to prevent catheter-associated urinary tract infections?  To prevent urinary tract infections, doctors and nurses take the following actions.   Catheter insertion  · External catheters in men (these look like condoms and are placed over the penis rather than into the penis)  · Putting a temporary catheter in to drain the urine and removing it right away. This is called intermittent urethral catheterization.  Catheter care  What can I do to help prevent catheter-associated urinary tract infections if I have a catheter?  · Always clean your hands before and after doing catheter care.  · Always keep your urine bag below the level of your bladder.  · Do not tug or pull on the tubing.  · Do not twist or kink the catheter tubing.  · Ask your healthcare provider each day if you still need the catheter.  What do I need to do when I go home from the hospital?  · If you will be going home with a catheter, your doctor or nurse should explain everything   you need to know about taking care of the catheter. Make sure you understand how to care for it before you leave the hospital.  · If you develop any of the symptoms of a urinary tract infection, such as burning or pain in the lower abdomen, fever, or an increase in the frequency of urination, contact your doctor or nurse immediately.  · Before you go home, make sure you know who to contact if you have questions or problems after you get home.  If you have questions, please ask your doctor or nurse.  Developed and co-sponsored by The  Society for Healthcare Epidemiology of America (SHEA); Infectious Diseases Society of America (IDSA); American Hospital Association; Association for Professionals in Infection Control and Epidemiology (APIC); Centers for Disease Control and Prevention (CDC); and The Joint Commission.     This information is not intended to replace advice given to you by your health care provider. Make sure you discuss any questions you have with your health care provider.     Document Released: 10/28/2011 Document Revised: 06/19/2014 Document Reviewed: 04/18/2014  Elsevier Interactive Patient Education ©2016 Elsevier Inc.

## 2015-01-26 NOTE — ED Notes (Signed)
Pt c/o urinary frequency and urgency and lower back aching; pt states that he had a UTI approx a month ago and that this feels similiar

## 2015-01-26 NOTE — ED Provider Notes (Addendum)
CSN: [id number]     Arrival date & time 01/26/15  1905 History   First MD Initiated Contact with Patient 01/26/15 1932     Chief Complaint  Patient presents with  . Urinary Tract Infection     (Consider location/radiation/quality/duration/timing/severity/associated sxs/prior Treatment) HPI Comments: Asian is a 19 year old male who has to self cath due to spina bifida. He is reusing his catheters because he does not have insurance right now and they are too expensive. He had a UTI approximately 2 months ago and at that time was treated with Keflex which relieved his symptoms. Now having similar symptoms  Patient is a 19 y.o. male presenting with urinary tract infection. The history is provided by the patient.  Urinary Tract Infection This is a new problem. The current episode started 2 days ago. The problem occurs constantly. The problem has been gradually worsening. Associated symptoms comments: Cloudy urine.  Left sided back pain, nausea.  No fever, vomiting or abd pain.. Nothing aggravates the symptoms. Nothing relieves the symptoms. He has tried nothing for the symptoms. The treatment provided no relief.    Past Medical History  Diagnosis Date  . Spina bifida ([place])   . Scoliosis    History reviewed. No pertinent past surgical history. No family history on file. Social History  Substance Use Topics  . Smoking status: Never Smoker   . Smokeless tobacco: None  . Alcohol Use: No    Review of Systems  All other systems reviewed and are negative.     Allergies  Amoxicillin and Latex  Home Medications   Prior to Admission medications   Medication Sig Start Date End Date Taking? Authorizing Provider  Catheters KIT Provide 21 self catheterization kits for PRN catheterization 11/08/13   Courtney Forcucci, PA-C  cephALEXin (KEFLEX) 500 MG capsule Take 1 capsule (500 mg total) by mouth 4 (four) times daily. 11/29/14   Tatyana Kirichenko, PA-C  ibuprofen (ADVIL,MOTRIN) 200 MG  tablet Take 400-600 mg by mouth daily as needed for headache or moderate pain.    Historical Provider, MD  ondansetron (ZOFRAN ODT) 8 MG disintegrating tablet Take 1 tablet (8 mg total) by mouth every 8 (eight) hours as needed for nausea or vomiting. 11/29/14   Tatyana Kirichenko, PA-C  traMADol (ULTRAM) 50 MG tablet Take 1 tablet (50 mg total) by mouth every 6 (six) hours as needed. 11/29/14   Tatyana Kirichenko, PA-C   BP 133/86 mmHg  Pulse 93  Temp(Src) 98 F (36.7 C) (Oral)  Resp 19  SpO2 98% Physical Exam  Constitutional: He is oriented to person, place, and time. He appears well-developed and well-nourished. No distress.  HENT:  Head: Normocephalic and atraumatic.  Mouth/Throat: Oropharynx is clear and moist.  Eyes: Conjunctivae and EOM are normal. Pupils are equal, round, and reactive to light.  Neck: Normal range of motion. Neck supple.  Cardiovascular: Normal rate, regular rhythm and intact distal pulses.   No murmur heard. Pulmonary/Chest: Effort normal and breath sounds normal. No respiratory distress. He has no wheezes. He has no rales.  Abdominal: Soft. He exhibits no distension. There is no tenderness. There is CVA tenderness. There is no rebound and no guarding.  Minimal left CVA tenderness  Musculoskeletal: Normal range of motion. He exhibits no edema or tenderness.  Neurological: He is alert and oriented to person, place, and time.  Skin: Skin is warm and dry. No rash noted. No erythema.  Psychiatric: He has a normal mood and affect. His behavior is normal.  Nursing  note and vitals reviewed.   ED Course  Procedures (including critical care time) Labs Review Labs Reviewed  URINALYSIS, ROUTINE W REFLEX MICROSCOPIC (NOT AT [facility]) - Abnormal; Notable for the following:    Leukocytes, UA SMALL (*)    All other components within normal limits  URINE MICROSCOPIC-ADD ON - Abnormal; Notable for the following:    Squamous Epithelial / LPF 0-5 (*)    Bacteria, UA RARE (*)     All other components within normal limits  URINE CULTURE    Imaging Review No results found. I have personally reviewed and evaluated these images and lab results as part of my medical decision-making.   EKG Interpretation None      MDM   Final diagnoses:  UTI (lower urinary tract infection)    Patient is a 19 year old male with a history of spina bifida self caths and states for the last 2 days he started to notice cloudy urine, back pain, nausea and muscle cramps concerning for UTI. He was diagnosed with UTI in October of this year which was sensitive to Macrobid, Cipro and cephalosporins. Patient is most likely getting the infection because he is wheezing catheters because he does not have insurance and cannot afford them.  He is well appearing here with normal vital signs. Low suspicion for pyelonephritis at this time and no prior symptoms or history of kidney stone. Will treat patient with Keflex and repeat urine culture done. He was also given ditropan.    Blanchie Dessert, MD 01/26/15 2033  Blanchie Dessert, MD 01/26/15 2035

## 2015-01-29 LAB — URINE CULTURE

## 2015-02-03 ENCOUNTER — Encounter (HOSPITAL_COMMUNITY): Payer: Self-pay

## 2015-02-03 ENCOUNTER — Emergency Department (HOSPITAL_COMMUNITY)
Admission: EM | Admit: 2015-02-03 | Discharge: 2015-02-03 | Disposition: A | Discharge status: Home / Self Care | Payer: 59 | Attending: Emergency Medicine | Admitting: Emergency Medicine

## 2015-02-03 DIAGNOSIS — Z792 Long term (current) use of antibiotics: Secondary | ICD-10-CM | POA: Insufficient documentation

## 2015-02-03 DIAGNOSIS — Z79899 Other long term (current) drug therapy: Secondary | ICD-10-CM | POA: Insufficient documentation

## 2015-02-03 DIAGNOSIS — Q059 Spina bifida, unspecified: Secondary | ICD-10-CM | POA: Insufficient documentation

## 2015-02-03 DIAGNOSIS — R112 Nausea with vomiting, unspecified: Secondary | ICD-10-CM | POA: Insufficient documentation

## 2015-02-03 DIAGNOSIS — Z880 Allergy status to penicillin: Secondary | ICD-10-CM | POA: Insufficient documentation

## 2015-02-03 DIAGNOSIS — R197 Diarrhea, unspecified: Secondary | ICD-10-CM

## 2015-02-03 DIAGNOSIS — M419 Scoliosis, unspecified: Secondary | ICD-10-CM | POA: Insufficient documentation

## 2015-02-03 DIAGNOSIS — Z91040 Latex allergy status: Secondary | ICD-10-CM | POA: Insufficient documentation

## 2015-02-03 LAB — URINALYSIS, ROUTINE W REFLEX MICROSCOPIC
Bilirubin Urine: NEGATIVE
GLUCOSE, UA: NEGATIVE mg/dL
Hgb urine dipstick: NEGATIVE
Ketones, ur: NEGATIVE mg/dL
Nitrite: NEGATIVE
PROTEIN: NEGATIVE mg/dL
SPECIFIC GRAVITY, URINE: 1.026 (ref 1.005–1.030)
pH: 5.5 (ref 5.0–8.0)

## 2015-02-03 LAB — URINE MICROSCOPIC-ADD ON

## 2015-02-03 MED ORDER — ONDANSETRON 4 MG PO TBDP: 4.0000 mg | ORAL_TABLET | Freq: Three times a day (TID) | ORAL | Status: DC | PRN

## 2015-02-03 MED ORDER — DICYCLOMINE HCL 20 MG PO TABS: 20.0000 mg | ORAL_TABLET | Freq: Two times a day (BID) | ORAL | Status: DC

## 2015-02-03 MED ORDER — SODIUM CHLORIDE 0.9 % IV BOLUS (SEPSIS): 1000.0000 mL | Freq: Once | INTRAVENOUS | Status: DC

## 2015-02-03 MED ORDER — ONDANSETRON 4 MG PO TBDP: 4.0000 mg | ORAL_TABLET | Freq: Once | ORAL | Status: DC | PRN

## 2015-02-03 MED ORDER — DICYCLOMINE HCL 10 MG PO CAPS: 20.0000 mg | ORAL_CAPSULE | Freq: Once | ORAL | Status: DC

## 2015-02-03 NOTE — ED Provider Notes (Signed)
CSN: [id number]     Arrival date & time 02/03/15  2159 History   First MD Initiated Contact with Patient 02/03/15 2252     Chief Complaint  Patient presents with  . Nausea  . Emesis     (Consider location/radiation/quality/duration/timing/severity/associated sxs/prior Treatment) HPI   Riley Simmons is a 19 y.o. male, with a history of spina bifida, presenting to the ED with nausea, vomiting, and diarrhea since 9 am this morning. Pt can not give a number of episodes of vomiting or diarrhea. Pt denies fever/chills, abdominal pain, chest pain, shortness of breath, urinary complaints, dizziness, or any other complaints.   Past Medical History  Diagnosis Date  . Spina bifida ([place])   . Scoliosis    History reviewed. No pertinent past surgical history. History reviewed. No pertinent family history. Social History  Substance Use Topics  . Smoking status: Never Smoker   . Smokeless tobacco: None  . Alcohol Use: No    Review of Systems  Constitutional: Negative for fever, chills, diaphoresis and unexpected weight change.  Respiratory: Negative for cough, chest tightness and shortness of breath.   Cardiovascular: Negative for chest pain.  Gastrointestinal: Positive for nausea, vomiting and diarrhea. Negative for abdominal pain, constipation and blood in stool.  Genitourinary: Negative for dysuria and flank pain.  Musculoskeletal: Negative for back pain.  Skin: Negative for color change and pallor.  Neurological: Negative for dizziness, syncope, weakness and light-headedness.  All other systems reviewed and are negative.     Allergies  Amoxicillin and Latex  Home Medications   Prior to Admission medications   Medication Sig Start Date End Date Taking? Authorizing Provider  Catheters KIT Provide 21 self catheterization kits for PRN catheterization 11/08/13   Riley Forcucci, PA-C  cephALEXin (KEFLEX) 250 MG capsule Take 1 capsule (250 mg total) by mouth 4 (four) times  daily. 01/26/15   Riley Dessert, MD  dicyclomine (BENTYL) 20 MG tablet Take 1 tablet (20 mg total) by mouth 2 (two) times daily. 02/03/15   Riley C Joy, PA-C  ibuprofen (ADVIL,MOTRIN) 200 MG tablet Take 400-600 mg by mouth daily as needed for headache or moderate pain.    Historical Provider, MD  ondansetron (ZOFRAN ODT) 4 MG disintegrating tablet Take 1 tablet (4 mg total) by mouth every 8 (eight) hours as needed for nausea or vomiting. 02/03/15   Riley C Joy, PA-C  ondansetron (ZOFRAN ODT) 8 MG disintegrating tablet Take 1 tablet (8 mg total) by mouth every 8 (eight) hours as needed for nausea or vomiting. 11/29/14   Riley Kirichenko, PA-C  oxybutynin (DITROPAN XL) 10 MG 24 hr tablet Take 1 tablet (10 mg total) by mouth at bedtime. 01/26/15   Riley Dessert, MD  traMADol (ULTRAM) 50 MG tablet Take 1 tablet (50 mg total) by mouth every 6 (six) hours as needed. 11/29/14   Riley Kirichenko, PA-C   BP 138/88 mmHg  Pulse 73  Temp(Src) 98 F (36.7 C) (Oral)  Resp 18  SpO2 100% Physical Exam  Constitutional: He appears well-developed and well-nourished. No distress.  HENT:  Head: Normocephalic and atraumatic.  Eyes: Conjunctivae are normal. Pupils are equal, round, and reactive to light.  Cardiovascular: Normal rate, regular rhythm and normal heart sounds.   Pulmonary/Chest: Effort normal and breath sounds normal. No respiratory distress.  Abdominal: Soft. Normal appearance and bowel sounds are normal. There is no tenderness. There is no CVA tenderness.  Musculoskeletal: He exhibits no edema or tenderness.  Neurological: He is alert.  Skin:  Skin is warm and dry. He is not diaphoretic.  Nursing note and vitals reviewed.   ED Course  Procedures (including critical care time) Labs Review Labs Reviewed  LIPASE, BLOOD  COMPREHENSIVE METABOLIC PANEL  CBC  URINALYSIS, ROUTINE W REFLEX MICROSCOPIC (NOT AT [facility])    Imaging Review No results found. I have personally reviewed and  evaluated these images and lab results as part of my medical decision-making.   EKG Interpretation None      MDM   Final diagnoses:  Diarrhea, unspecified type  Non-intractable vomiting with nausea, vomiting of unspecified type    Riley Simmons presents with complaint of nausea, vomiting, and diarrhea since this morning.  Patient is nontoxic appearing, afebrile, not tachycardic, normotensive, has a benign abdominal exam, and is maintaining a SpO2 of 100%. Patient's presentation is consistent with a viral illness. Pt was offered antinausea medication, but pt declined stating, "I am primarily here for treatment of my diarrhea." Patient was told that he would receive an IV to help with rehydration, but he declined this as well stating, "I'm fine just drinking water." Patient was advised that there is not much else that we can do for him. Which she replied, "I didn't want to come here. My boss made me and said that I would be fired if I didn't comment to the ED." Patient be discharged. Patient given instructions for home care and return precautions. Patient voiced understanding of these instructions, accepts the plan, and is comfortable with discharge.  Filed Vitals:   02/03/15 2207 02/03/15 2244 02/03/15 2245  BP: 125/89 138/88   Pulse: 94  73  Temp: 98 F (36.7 C)    TempSrc: Oral    Resp: 18    SpO2: 100%  100%     Riley Bender, PA-C 02/03/15 2317  Riley Greek, MD 02/04/15 [phone]

## 2015-02-03 NOTE — ED Notes (Signed)
Bed: AV40WA12 Expected date:  Expected time:  Means of arrival:  Comments: EKG

## 2015-02-03 NOTE — ED Notes (Signed)
Pt states he has been having vomiting and diarrhea all day, pt is unable to report how many times of either. Pt states he does not want any medicine for the nausea, he refused zofran. Pt just wants medicine for diarrhea. Pt's vital signs are stable at this time

## 2015-02-03 NOTE — ED Notes (Signed)
Pt refusing all medication. Pt states he wants a doctors note to get him out of work. Pt was given a note to excuse him until tomorrow

## 2015-02-03 NOTE — ED Notes (Signed)
Pt made aware of the need for urine.

## 2015-02-03 NOTE — Discharge Instructions (Signed)
You have been seen today for nausea, vomiting, diarrhea. Your lab tests showed no abnormalities. Follow up with PCP as needed. Return to ED should symptoms worsen. Plenty of fluids and get plenty of rest. Use Tylenol for pain or fever. Bentyl for abdominal discomfort. Zofran for nausea. Imodium may offer some relief from diarrhea.   Emergency Department Resource Guide 1) Find a Doctor and Pay Out of Pocket Although you won't have to find out who is covered by your insurance plan, it is a good idea to ask around and get recommendations. You will then need to call the office and see if the doctor you have chosen will accept you as a new patient and what types of options they offer for patients who are self-pay. Some doctors offer discounts or will set up payment plans for their patients who do not have insurance, but you will need to ask so you aren't surprised when you get to your appointment.  2) Contact Your Local Health Department Not all health departments have doctors that can see patients for sick visits, but many do, so it is worth a call to see if yours does. If you don't know where your local health department is, you can check in your phone book. The CDC also has a tool to help you locate your state's health department, and many state websites also have listings of all of their local health departments.  3) Find a Walk-in Clinic If your illness is not likely to be very severe or complicated, you may want to try a walk in clinic. These are popping up all over the country in pharmacies, drugstores, and shopping centers. They're usually staffed by nurse practitioners or physician assistants that have been trained to treat common illnesses and complaints. They're usually fairly quick and inexpensive. However, if you have serious medical issues or chronic medical problems, these are probably not your best option.  No Primary Care Doctor: - Call Health Connect at  [phone] - they can help you locate  a primary care doctor that  accepts your insurance, provides certain services, etc. - Physician Referral Service- [phone]  Chronic Pain Problems: Organization         Address  Phone   Notes  [facility]  [phone]) [phone] Patients need to be referred by their primary care doctor.   Medication Assistance: Organization         Address  Phone   Notes  [facility] Medication [facility] [address] [place], [place] [address] [phone] --Must be a resident of [facility] [place] -- Must have NO insurance coverage whatsoever (no Medicaid/ Medicare, etc.) -- The pt. MUST have a primary care doctor that directs their care regularly and follows them in the community   MedAssist  [phone]) [phone]   [employer]  [phone]    Agencies that provide inexpensive medical care: Organization         Address  Phone   Notes  [facility]  [phone]   [facility]    [phone]   [facility] [address] [place], [place] [address] [phone]   [facility] [address], [place] [phone]   Planned Parenthood    [phone]   [facility]    [phone]) [phone]   [facility]  [address], [place] Phone:  [phone], Fax:  [phone]  Hours of Operation:  9 am - 6 pm, M-F.  Also accepts Medicaid/Medicare and self-pay.  [facility] for [place], [address], [place] Phone: [phone], Fax: [phone]. Hours of Operation:  8:30 am - 5:30 pm, M-F.  Also accepts Medicaid and self-pay.  [facility] [place] [address] [place] Phone: [phone]   [place], [place], [place] [phone], Ext. [phone] Mondays & Thursdays: 7-9 AM.  First 15 patients are seen on a first come, first serve basis.    [place]  Providers:  Organization         Address  Phone   Notes  [facility] [address], [place] [phone] Also accepts self-pay patients.  [facility] [address], [place]  [phone]   [place], [address], [place] [phone]   [facility] [address] [place] [phone]   Lucianne Lei [address], [place]   [phone] Only accepts [place] Access [place] patients after they have their name applied to their card.   Self-Pay (no insurance) in [facility]:  Organization         Address  Phone   Notes  Sickle Cell Patients, [facility] [place] [phone]   [facility] [place] [phone]   [facility] [place]  [place], [place], [place] [phone]   [facility]/Dr. Osei-Bonsu  [address] [place] or [address], [place] [phone] Phone number for both [place] and [place] locations is the same.  [facility] [address] [place] [phone]   [facility] [address] [place] or [address] [phone]   [facility] [address], [place] [phone], phone; [phone], fax Sees patients 1st and 3rd Saturday of every month.  Must not qualify for public or private insurance (i.e. Medicaid, Medicare, [place] Health Choice, Veterans' Benefits)  Household income should be no more than 200% of the poverty level The clinic cannot treat you if you are pregnant or think you are pregnant  Sexually transmitted diseases are not treated at the clinic.    Dental Care: Organization         Address  Phone  Notes  [facility] [place] [phone] Accepts children up to age 74 who are enrolled in [place] or [place]; pregnant women with a Medicaid card; and children who have applied for Medicaid or [place] Health Choice, but were declined, whose parents can pay a reduced fee at time of service.  [facility]  [address], [place] [phone] Accepts children up to age 32 who are enrolled in [place] or [place]; pregnant women with a Medicaid card; and children who have applied for Medicaid or [place] Health Choice, but were declined, whose parents can pay a reduced fee at time of service.  [facility] Access PRO[facility]  [place] [phone] Patients are seen by appointment only. Walk-ins are not accepted. [place] will  see patients 45 years of age and older. Monday - Tuesday (8am-5pm) Most Wednesdays (8:30-5pm) $30 per visit, cash only  [facility] Adult Dental Access PROGRAM  [address], [facility] [phone] Patients are seen by appointment only. Walk-ins are not accepted. [place] will see patients 100 years of age and older. One Wednesday Evening (Monthly: Volunteer Based).  $30 per visit, cash only  [place]  [phone] for adults; Children under age 39, call Graduate Pediatric Dentistry at [phone]. Children aged 31-14, please call [phone] to request a pediatric application.  Dental services are provided in all areas of dental care including fillings, crowns and bridges, complete and partial dentures, implants, gum treatment, root canals, and extractions. Preventive care is also provided. Treatment is provided to both adults and children. Patients are selected via a lottery and there is often a waiting list.   [facility] [address] [place]  [phone] [email or web address]   [facility]  [address] [place], [place] [phone], Ext. [phone] Second and Fourth Thursday of each month, opens at 6:30 AM; Clinic ends at 9 AM.  Patients are seen on a first-come first-served basis, and a limited number are seen during each clinic.   [facility]  [address] Hillard Danker [place], [place] [phone]   Eligibility Requirements You must have lived in [place], [place], or [place] counties for at least the last three months.   You cannot be eligible for state or federal sponsored [employer], including [employer], [place], or [employer].   You generally cannot be eligible for healthcare insurance through your employer.    How to apply: Eligibility screenings are held every Tuesday and Wednesday afternoon from 1:00 pm until 4:00 pm. You do not need an appointment for the interview!  [facility] [address] [place], [place]   [place]  [place] Department  [place]  [phone]    Behavioral Health Resources in the Community: Intensive Outpatient Programs Organization         Address  Phone  Notes  [place]. [address] [place], [place] [phone]   [facility] [address], [place], [place]   ADS: Alcohol & Drug S[facility]cs [address], [place], [place]   [place] [address]  [place], [place] or [phone]   Substance Abuse Resources Organization         Address  Phone  Notes  Alcohol and Drug Services  [phone]   [place]  [phone]   [place]   Chinita Pester  [phone]   Residential & Outpatient Substance Abuse Program  [phone]   Psychological Services Organization         Address  Phone  Notes  [facility] [place]  [place]  [phone]   [place] [address] [place] or [phone]    Mobile Crisis Teams Organization         Address  Phone  Notes  [facility], Mobile Crisis Care Unit  [phone]   [facility] Psychotherapeutic Services  [address] [place], [place]   [facility] [address] [place] [phone]    Self-Help/Support Groups Organization  Address  Phone             Notes  [place]. of [place] - variety of support groups  [place] Call for more information  Narcotics Anonymous (NA), [facility]aring [facility] [address], [employer] [place]  2 meetings at this location   Special educational needs teacher         Address  Phone  Notes  ASAP Residential Treatment [place],    [place]  [phone]   [facility]  [address], [place] [address]9924, [place], [place]   [place], [place] [phone] Admissions: 8am-3pm M-F  Incentives Substance [place] [address],    [place], [place] [phone]   [facility] [address] [place], [place], [place]   [facility] [address],  [place], [place]   [facility]ight Programs - Intensive Outpatient [address] Kristeen [address], [place], [place]   [facility] ([place]  [place], [place] [phone] or [phone]   [facility]idential Treatment Services (RTS) [address] [place], [place] Accepts Medicaid  [facility] [place] [address]  [place] [phone] Substance Abuse/Addiction Treatment   [facility] Organization         Address  Phone  Notes  [facility]  [phone]   Domenic Schwab, PhD [address] Arlis Porta [place], [place]   [address] or [phone]    [place], [place] [phone]   [facility] [address], [place], [place] [phone] Insurance/Medicaid/sponsorship through [facility] and Families [address], [address]                                    [place], [place] [phone] [place] [address], [place] [phone]    Dr. Adele Schilder  [phone]   [facility] Dept. 1) [address], [place] 2) [place] 3)  [place] [address], [place] [phone]  [phone]   [place] [phone] or [phone] (After Hours)

## 2015-02-03 NOTE — ED Notes (Signed)
PT complaining of N/V/D and generalized body aches beginning this morning. Also complaining of abdominal pain. Denies fever.

## 2015-02-13 ENCOUNTER — Encounter (HOSPITAL_COMMUNITY): Payer: Self-pay | Admitting: Emergency Medicine

## 2015-02-13 ENCOUNTER — Emergency Department (HOSPITAL_COMMUNITY)
Admission: EM | Admit: 2015-02-13 | Discharge: 2015-02-14 | Disposition: A | Discharge status: Home / Self Care | Payer: 59 | Attending: Emergency Medicine | Admitting: Emergency Medicine

## 2015-02-13 DIAGNOSIS — Z760 Encounter for issue of repeat prescription: Secondary | ICD-10-CM | POA: Insufficient documentation

## 2015-02-13 DIAGNOSIS — M419 Scoliosis, unspecified: Secondary | ICD-10-CM | POA: Insufficient documentation

## 2015-02-13 DIAGNOSIS — Z880 Allergy status to penicillin: Secondary | ICD-10-CM | POA: Insufficient documentation

## 2015-02-13 DIAGNOSIS — Z91040 Latex allergy status: Secondary | ICD-10-CM | POA: Insufficient documentation

## 2015-02-13 DIAGNOSIS — N342 Other urethritis: Secondary | ICD-10-CM | POA: Insufficient documentation

## 2015-02-13 DIAGNOSIS — Z79899 Other long term (current) drug therapy: Secondary | ICD-10-CM | POA: Insufficient documentation

## 2015-02-13 DIAGNOSIS — Z792 Long term (current) use of antibiotics: Secondary | ICD-10-CM | POA: Insufficient documentation

## 2015-02-13 NOTE — ED Notes (Signed)
Pt states he was seen on 12-18 with signs of UTI and was given a prescription for antibiotics  Pt states he lost the prescription and needs a new one  Pt states his symptoms returned earlier today  Pt is c/o urinary frequency

## 2015-02-14 LAB — URINALYSIS, ROUTINE W REFLEX MICROSCOPIC
Glucose, UA: NEGATIVE mg/dL
Hgb urine dipstick: NEGATIVE
KETONES UR: 40 mg/dL — AB
NITRITE: POSITIVE — AB
PH: 6 (ref 5.0–8.0)
PROTEIN: 30 mg/dL — AB
Specific Gravity, Urine: 1.03 (ref 1.005–1.030)

## 2015-02-14 LAB — URINE MICROSCOPIC-ADD ON
RBC / HPF: NONE SEEN RBC/hpf (ref 0–5)
SQUAMOUS EPITHELIAL / LPF: NONE SEEN

## 2015-02-14 MED ORDER — CIPROFLOXACIN HCL 500 MG PO TABS: 500.0000 mg | ORAL_TABLET | Freq: Two times a day (BID) | ORAL | Status: DC

## 2015-02-14 MED ORDER — CEFTRIAXONE SODIUM 1 G IJ SOLR
1.0000 g | Freq: Once | INTRAMUSCULAR | Status: DC
  Filled 2015-02-14: qty 10

## 2015-02-14 MED ORDER — STERILE WATER FOR INJECTION IJ SOLN
INTRAMUSCULAR | Status: AC
  Filled 2015-02-14: qty 10

## 2015-02-14 NOTE — ED Notes (Signed)
Patient requested to urinate. 

## 2015-02-14 NOTE — ED Provider Notes (Signed)
CSN: [id number]     Arrival date & time 02/13/15  2243 History   First MD Initiated Contact with Patient 02/14/15 0028     Chief Complaint  Patient presents with  . Medication Refill     (Consider location/radiation/quality/duration/timing/severity/associated sxs/prior Treatment) HPI   Blood pressure 136/85, pulse 93, temperature 98.3 F (36.8 C), temperature source Oral, resp. rate 16, SpO2 97 %.  Riley Simmons is a 19 y.o. male complaining of urinary frequency, dysuria onset several days ago. Patient has history of spina bifida and self caths. States that he has been reusing his catheterization device because he is uninsured. Patient states that he took Keflex, approximately 3 days of the course  Past Medical History  Diagnosis Date  . Spina bifida ([place])   . Scoliosis    History reviewed. No pertinent past surgical history. History reviewed. No pertinent family history. Social History  Substance Use Topics  . Smoking status: Never Smoker   . Smokeless tobacco: None  . Alcohol Use: No    Review of Systems  10 systems reviewed and found to be negative, except as noted in the HPI.   Allergies  Amoxicillin and Latex  Home Medications   Prior to Admission medications   Medication Sig Start Date End Date Taking? Authorizing Provider  Catheters KIT Provide 21 self catheterization kits for PRN catheterization 11/08/13   Courtney Forcucci, PA-C  cephALEXin (KEFLEX) 250 MG capsule Take 1 capsule (250 mg total) by mouth 4 (four) times daily. 01/26/15   Blanchie Dessert, MD  dicyclomine (BENTYL) 20 MG tablet Take 1 tablet (20 mg total) by mouth 2 (two) times daily. 02/03/15   Shawn C Joy, PA-C  ibuprofen (ADVIL,MOTRIN) 200 MG tablet Take 400-600 mg by mouth daily as needed for headache or moderate pain.    Historical Provider, MD  ondansetron (ZOFRAN ODT) 4 MG disintegrating tablet Take 1 tablet (4 mg total) by mouth every 8 (eight) hours as needed for nausea or vomiting.  02/03/15   Shawn C Joy, PA-C  ondansetron (ZOFRAN ODT) 8 MG disintegrating tablet Take 1 tablet (8 mg total) by mouth every 8 (eight) hours as needed for nausea or vomiting. 11/29/14   Tatyana Kirichenko, PA-C  oxybutynin (DITROPAN XL) 10 MG 24 hr tablet Take 1 tablet (10 mg total) by mouth at bedtime. 01/26/15   Blanchie Dessert, MD  traMADol (ULTRAM) 50 MG tablet Take 1 tablet (50 mg total) by mouth every 6 (six) hours as needed. 11/29/14   Tatyana Kirichenko, PA-C   BP 136/85 mmHg  Pulse 93  Temp(Src) 98.3 F (36.8 C) (Oral)  Resp 16  SpO2 97% Physical Exam  Constitutional: He is oriented to person, place, and time. He appears well-developed and well-nourished. No distress.  HENT:  Head: Normocephalic.  Eyes: Conjunctivae and EOM are normal.  Cardiovascular: Normal rate.   Pulmonary/Chest: Effort normal and breath sounds normal. No stridor. No respiratory distress. He has no wheezes. He has no rales. He exhibits no tenderness.  Abdominal: Soft. Bowel sounds are normal. He exhibits no distension and no mass. There is no tenderness. There is no rebound and no guarding.  Genitourinary:  No CVA TTP  Musculoskeletal: Normal range of motion. He exhibits no edema.  Neurological: He is alert and oriented to person, place, and time.  Psychiatric: He has a normal mood and affect.  Nursing note and vitals reviewed.   ED Course  Procedures (including critical care time) Labs Review Labs Reviewed  URINE CULTURE  URINALYSIS,  ROUTINE W REFLEX MICROSCOPIC (NOT AT [facility])  GC/CHLAMYDIA PROBE AMP ([place]) NOT AT [facility]    Imaging Review No results found. I have personally reviewed and evaluated these images and lab results as part of my medical decision-making.   EKG Interpretation None      MDM   Final diagnoses:  Urethritis    Filed Vitals:   02/13/15 2255  BP: 136/85  Pulse: 93  Temp: 98.3 F (36.8 C)  TempSrc: Oral  Resp: 16  SpO2: 97%    Medications  cefTRIAXone  (ROCEPHIN) injection 1 g (not administered)    Riley Simmons is 19 y.o. male presenting with urinary frequency and dysuria, patient self catheterizes. He incompletely took a course of Keflex for UTI several weeks ago. Chart review shows prior culture and sensitivities with Escherichia coli sensitive to Rocephin and gentamicin. Patient will be given Rocephin in the ED and fosfomycin. Culture pending.  Pt given 2x 12 french caths. Pt declines these saying they are "funny" Pt declines Rocephin. States that he cannot stay in the ED for result of UA.  GC/chlamydia added to UA.  Evaluation does not show pathology that would require ongoing emergent intervention or inpatient treatment. Pt is hemodynamically stable and mentating appropriately. Discussed findings and plan with patient/guardian, who agrees with care plan. All questions answered. Return precautions discussed and outpatient follow up given.   New Prescriptions   CIPROFLOXACIN (CIPRO) 500 MG TABLET    Take 1 tablet (500 mg total) by mouth every 12 (twelve) hours.         Nicole Pisciotta, PA-C 02/14/15 0222  April Palumbo, MD 02/14/15 0222 

## 2015-02-14 NOTE — ED Notes (Signed)
Primary RN ask charge RN (Sheilla Maris) to speak with patient. Pt upset he left work [employer] for UTI s/s he was seen for recently but lost his prescriptions. Pt upset provider will not give him a work note excusing him for leaving work, pt did refuse medication for s/s. Spoke to patient and family at length and also advised him time and date is present on d/c instructions and can be showed to employer as proof he was seen in the ED. Pt did confirm he did not feel this was an emergency tonight, he states he has had this condition for his whole life and just needed medication.

## 2015-02-14 NOTE — ED Notes (Signed)
Dr. Truitt MerleNotified patient wanted a work note.

## 2015-02-14 NOTE — Discharge Instructions (Signed)
Do not hesitate to return to the emergency room for any new, worsening or concerning symptoms.  Please obtain primary care using resource guide below. Let them know that you were seen in the emergency room and that they will need to obtain records for further outpatient management.    Urethritis, Adult Urethritis is an inflammation of the tube through which urine exits your bladder (urethra).  CAUSES Urethritis is often caused by an infection in your urethra. The infection can be viral, like herpes. The infection can also be bacterial, like gonorrhea. RISK FACTORS Risk factors of urethritis include:  Having sex without using a condom.  Having multiple sexual partners.  Having poor hygiene. SIGNS AND SYMPTOMS Symptoms of urethritis are less noticeable in women than in men. These symptoms include:  Burning feeling when you urinate (dysuria).  Discharge from your urethra.  Blood in your urine (hematuria).  Urinating more than usual. DIAGNOSIS  To confirm a diagnosis of urethritis, your health care provider will do the following:  Ask about your sexual history.  Perform a physical exam.  Have you provide a sample of your urine for lab testing.  Use a cotton swab to gently collect a sample from your urethra for lab testing. TREATMENT  It is important to treat urethritis. Depending on the cause, untreated urethritis may lead to serious genital infections and possibly infertility. Urethritis caused by a bacterial infection is treated with antibiotic medicine. All sexual partners must be treated.  HOME CARE INSTRUCTIONS  Do not have sex until the test results are known and treatment is completed, even if your symptoms go away before you finish treatment.  If you were prescribed an antibiotic, finish it all even if you start to feel better. SEEK MEDICAL CARE IF:   Your symptoms are not improved in 3 days.  Your symptoms are getting worse.  You develop abdominal pain or  pelvic pain (in women).  You develop joint pain.  You have a fever. SEEK IMMEDIATE MEDICAL CARE IF:   You have severe pain in the belly, back, or side.  You have repeated vomiting. MAKE SURE YOU:  Understand these instructions.  Will watch your condition.  Will get help right away if you are not doing well or get worse.   This information is not intended to replace advice given to you by your health care provider. Make sure you discuss any questions you have with your health care provider.   Document Released: 07/29/2000 Document Revised: 06/19/2014 Document Reviewed: 10/03/2012 Elsevier Interactive Patient Education 2016 [employer]  Emergency Department Resource Guide 1) Find a Doctor and Pay Out of Pocket Although you won't have to find out who is covered by your insurance plan, it is a good idea to ask around and get recommendations. You will then need to call the office and see if the doctor you have chosen will accept you as a new patient and what types of options they offer for patients who are self-pay. Some doctors offer discounts or will set up payment plans for their patients who do not have insurance, but you will need to ask so you aren't surprised when you get to your appointment.  2) Contact Your Local Health Department Not all health departments have doctors that can see patients for sick visits, but many do, so it is worth a call to see if yours does. If you don't know where your local health department is, you can check in your phone book. The CDC also has  a tool to help you locate your state's health department, and many state websites also have listings of all of their local health departments.  3) Find a Walk-in Clinic If your illness is not likely to be very severe or complicated, you may want to try a walk in clinic. These are popping up all over the country in pharmacies, drugstores, and shopping centers. They're usually staffed by nurse practitioners or  physician assistants that have been trained to treat common illnesses and complaints. They're usually fairly quick and inexpensive. However, if you have serious medical issues or chronic medical problems, these are probably not your best option.  No Primary Care Doctor: - Call Health Connect at  [phone] - they can help you locate a primary care doctor that  accepts your insurance, provides certain services, etc. - Physician Referral Service- [phone]  Chronic Pain Problems: Organization         Address  Phone   Notes  [facility]  [phone] Patients need to be referred by their primary care doctor.   Medication Assistance: Organization         Address  Phone   Notes  [facility] Medication [facility] [address] [place], [place] [address] [phone] --Must be a resident of [facility] -- Must have NO insurance coverage whatsoever (no Medicaid/ Medicare, etc.) -- The pt. MUST have a primary care doctor that directs their care regularly and follows them in the community   MedAssist  [phone]   [employer]  [phone]    Agencies that provide inexpensive medical care: Organization         Address  Phone   Notes  [facility]  [phone]   [facility]    [phone]   [facility] [address] [place], [place] [address] [phone]   [facility] [address] [place], [place] [phone]   Planned Parenthood    [phone]   [facility]    [phone]   [facility]  [address], [place] Phone:  [phone], Fax:  [phone] Hours of Operation:  9 am - 6 pm, M-F.  Also accepts Medicaid/Medicare and self-pay.  [facility]  [address], [place] Phone: [phone], Fax: [phone]. Hours of Operation:  8:30 am - 5:30 pm, M-F.   Also accepts Medicaid and self-pay.  [facility] [place] [address], [place] Phone: [phone]   [facility] [address] Natasha Bence [place], [place] [phone], Ext. [phone] Mondays & Thursdays: 7-9 AM.  First 15 patients are seen on a first come, first serve basis.    Medicaid-accepting [facility] Providers:  Organization         Address  Phone   Notes  [facility] [address], [place] [phone] Also accepts self-pay patients.  [facility] [address] Laurell Josephs [place], [place]  [phone]   [facility] [address], [place] [phone]   [facility] [address], [place] [phone]   Renaye Rakers [address], [place]   [phone] Only accepts [place] Access [place] patients after they have their name applied to their card.   Self-Pay (no insurance) in [place]  County:  Organization         Address  Phone   Notes  Sickle Cell Patients, [facility] [address] [place], [place] [phone]   [facility] [address] [place], [place] [phone]   [facility] [place]  [address],  [phone]   [facility]/Dr. Osei-Bonsu  [address] [place] or [address], [place] [phone] Phone number for both [place] and [place] locations is the same.  Urgent Medical and [facility] [address] [place] [phone]   [facility] [address], [place] or [address] [phone]   [facility] [address] [place] [phone], phone; [phone], fax Sees patients 1st and 3rd Saturday of every month.  Must not qualify for public or private insurance (i.e. Medicaid, Medicare, [place] Health Choice, Veterans'  Benefits)  Household income should be no more than 200% of the poverty level The clinic cannot treat you if you are pregnant or think you are pregnant  Sexually transmitted diseases are not treated at the clinic.    Dental Care: Organization         Address  Phone  Notes  [facility] of [facility] [address] [place], [place] [phone] Accepts children up to age 20 who are enrolled in [place] or [place] Health Choice; pregnant women with a Medicaid card; and children who have applied for Medicaid or [place] Health Choice, but were declined, whose parents can pay a reduced fee at time of service.  [facility] Department of [facility]  [address], [place] [phone] Accepts children up to age 45 who are enrolled in [place] or [place] Health Choice; pregnant women with a Medicaid card; and children who have applied for Medicaid or [place] Health Choice, but were declined, whose parents can pay a reduced fee at time of service.  [facility]  [address] [place], [place] [phone] Patients are seen by appointment only. Walk-ins are not accepted. [facility] will see patients 6 years of age and older. Monday - Tuesday (8am-5pm) Most Wednesdays (8:30-5pm) $30 per visit, cash only  [facility] Adult [facility] Access PRO[facility]  [address], [facility] [phone] Patients are seen by appointment only. Walk-ins are not accepted. [facility] will see patients 19 years of age and older. One Wednesday Evening (Monthly: Volunteer Based).  $30 per visit, cash only  [employer]  [phone] for adults; Children under age 25, call Graduate Pediatric Dentistry at [phone]. Children aged 31-14, please call [phone] to request a pediatric application.  Dental services are provided in all areas of dental care including fillings, crowns and bridges, complete and partial  dentures, implants, gum treatment, root canals, and extractions. Preventive care is also provided. Treatment is provided to both adults and children. Patients are selected via a lottery and there is often a waiting list.   [facility] [address], [place]  [phone] [email or web address]   [facility] [address] [place], [place] [phone], Ext. [phone] Second and Fourth Thursday of each month, opens at 6:30 AM; Clinic ends at 9 AM.  Patients are seen on a first-come first-served basis, and a limited number are seen during each clinic.  [facility]  [address] Hillard Danker [place], [place] [phone]   Eligibility Requirements You must have lived in [place], [place], or [place] counties for at least the last three months.   You cannot be eligible for state or federal sponsored [employer], including [employer], [place], or [employer].   You generally cannot be eligible for healthcare insurance through your employer.    How to apply: Eligibility screenings are held every Tuesday and Wednesday afternoon from 1:00 pm until 4:00 pm. You do not need an appointment for the interview!  [facility] [address] [place], [place]   [place]  [place] Department  [place]  [phone]    Behavioral Health Resources in the Community: Intensive Outpatient Programs Organization         Address  Phone  Notes  [place] [address], [place], [place] [phone]   [facility]patient [address] [place], [place]   ADS: [facility] Drug [facility] [address], [place], [place]   [place] [address]  [place], [place] or [phone]   Substance Abuse Resources Organization          Address  Phone  Notes  Al[facility] and Drug Services  [phone]   [place]  [phone]   [facility]   Chinita Pester  [phone]   Residential & Outpatient Substance Abuse Program  [phone]   Psychological Services Organization         Address  Phone  Notes  [facility] [place]  [place]  [phone]   [place] [address] [place] or [phone]    Mobile Crisis Teams Organization         Address  Phone  Notes  [facility], Mobile Crisis Care Unit  [phone]   [facility]  [address] [place], [place]   [place] [address], [place] [phone]    Self-Help/Support Groups Organization         Address  Phone             Notes  [place]. of [place] - variety of support groups  [place] Call for more information  Narcotics Anonymous (NA), [facility]aring [facility] [address], [employer] [place]  2 meetings at this location   Special educational needs teacher         Address  Phone  Notes  [facility] Residential Treatment [place],    [place]  [phone])[phone]   [facility]  [address], [place] [address], [place], [place]   [place], [place] [phone] Admissions: 8am-3pm M-F  Incentives Substance [place] [address]    [place], [place] [phone]   [facility] [address] Jadene Pierini [place], [place]   [facility] [address],  [place], [place]   [facility] Programs - Intensive Outpatient [address] Kristeen [address], [place], [place]   [facility] ([place].,  [place], [place] or [phone]   [facility] (RTS) [address] [place], [place] Accepts Medicaid  [facility] [place] [address]  [place] [phone])[phone] Substance Abuse/Addiction  [place] Resources Organization         Address  Phone  Notes  [facility]  [phone]   Domenic Schwab, PhD [address] Arlis Porta [place], [place]   [phone] or [phone]   [place], [place] [phone]   [address], [place], [place] [phone] Insurance/Medicaid/sponsorship through [facility] and Families [address], [address] [place]                                    [place], [place] [phone] [place] [address] [place], [place] [phone]    Dr. Adele Schilder  [phone]   [facility] Dept. 1) [address], [place] 2) [place] 3)  [place] [address], [place] [phone]  [phone]   [facility] [phone] or [phone] (After Hours)

## 2015-02-16 LAB — URINE CULTURE

## 2015-02-18 ENCOUNTER — Telehealth (HOSPITAL_COMMUNITY): Payer: Self-pay

## 2015-02-18 ENCOUNTER — Emergency Department (HOSPITAL_COMMUNITY)
Admission: EM | Admit: 2015-02-18 | Discharge: 2015-02-18 | Disposition: A | Discharge status: Home / Self Care | Payer: 59 | Attending: Emergency Medicine | Admitting: Emergency Medicine

## 2015-02-18 ENCOUNTER — Encounter (HOSPITAL_COMMUNITY): Payer: Self-pay | Admitting: Emergency Medicine

## 2015-02-18 DIAGNOSIS — Z79899 Other long term (current) drug therapy: Secondary | ICD-10-CM | POA: Insufficient documentation

## 2015-02-18 DIAGNOSIS — M419 Scoliosis, unspecified: Secondary | ICD-10-CM | POA: Insufficient documentation

## 2015-02-18 DIAGNOSIS — Z880 Allergy status to penicillin: Secondary | ICD-10-CM | POA: Insufficient documentation

## 2015-02-18 DIAGNOSIS — Z91040 Latex allergy status: Secondary | ICD-10-CM | POA: Insufficient documentation

## 2015-02-18 DIAGNOSIS — N451 Epididymitis: Secondary | ICD-10-CM | POA: Insufficient documentation

## 2015-02-18 DIAGNOSIS — N50812 Left testicular pain: Secondary | ICD-10-CM | POA: Insufficient documentation

## 2015-02-18 HISTORY — DX: Other urethritis: N34.2

## 2015-02-18 HISTORY — DX: Cannabis use, unspecified, uncomplicated: F12.90

## 2015-02-18 LAB — URINALYSIS, ROUTINE W REFLEX MICROSCOPIC
Bilirubin Urine: NEGATIVE
GLUCOSE, UA: NEGATIVE mg/dL
Hgb urine dipstick: NEGATIVE
KETONES UR: NEGATIVE mg/dL
NITRITE: NEGATIVE
PROTEIN: NEGATIVE mg/dL
Specific Gravity, Urine: 1.018 (ref 1.005–1.030)
pH: 7.5 (ref 5.0–8.0)

## 2015-02-18 LAB — CBC WITH DIFFERENTIAL/PLATELET
BASOS ABS: 0.1 10*3/uL (ref 0.0–0.1)
Basophils Relative: 0 %
EOS ABS: 0.3 10*3/uL (ref 0.0–0.7)
Eosinophils Relative: 2 %
HEMATOCRIT: 41.9 % (ref 39.0–52.0)
Hemoglobin: 14 g/dL (ref 13.0–17.0)
Lymphocytes Relative: 21 %
Lymphs Abs: 2.5 10*3/uL (ref 0.7–4.0)
MCH: 30.2 pg (ref 26.0–34.0)
MCHC: 33.4 g/dL (ref 30.0–36.0)
MCV: 90.3 fL (ref 78.0–100.0)
MONO ABS: 1.2 10*3/uL — AB (ref 0.1–1.0)
Monocytes Relative: 10 %
NEUTROS ABS: 8.1 10*3/uL — AB (ref 1.7–7.7)
NEUTROS PCT: 67 %
PLATELETS: 201 10*3/uL (ref 150–400)
RBC: 4.64 MIL/uL (ref 4.22–5.81)
RDW: 12.8 % (ref 11.5–15.5)
WBC: 12.1 10*3/uL — ABNORMAL HIGH (ref 4.0–10.5)

## 2015-02-18 LAB — COMPREHENSIVE METABOLIC PANEL
ALK PHOS: 70 U/L (ref 38–126)
ALT: 16 U/L — AB (ref 17–63)
ANION GAP: 9 (ref 5–15)
AST: 21 U/L (ref 15–41)
Albumin: 4 g/dL (ref 3.5–5.0)
BUN: 9 mg/dL (ref 6–20)
CALCIUM: 9 mg/dL (ref 8.9–10.3)
CHLORIDE: 102 mmol/L (ref 101–111)
CO2: 27 mmol/L (ref 22–32)
Creatinine, Ser: 1.14 mg/dL (ref 0.61–1.24)
Glucose, Bld: 84 mg/dL (ref 65–99)
Potassium: 4.2 mmol/L (ref 3.5–5.1)
Sodium: 138 mmol/L (ref 135–145)
TOTAL PROTEIN: 7.2 g/dL (ref 6.5–8.1)
Total Bilirubin: 0.7 mg/dL (ref 0.3–1.2)

## 2015-02-18 LAB — URINE MICROSCOPIC-ADD ON

## 2015-02-18 MED ORDER — OXYBUTYNIN CHLORIDE ER 10 MG PO TB24: 10.0000 mg | ORAL_TABLET | Freq: Every day | ORAL | Status: DC

## 2015-02-18 MED ORDER — HYDROCODONE-ACETAMINOPHEN 5-325 MG PO TABS
2.0000 | ORAL_TABLET | Freq: Once | ORAL | Status: AC
Start: 2015-02-18 — End: 2015-02-18
  Administered 2015-02-18: 2 via ORAL
  Filled 2015-02-18: qty 2

## 2015-02-18 MED ORDER — CIPROFLOXACIN HCL 500 MG PO TABS: 500.0000 mg | ORAL_TABLET | Freq: Two times a day (BID) | ORAL | Status: AC

## 2015-02-18 MED ORDER — HYDROCODONE-ACETAMINOPHEN 5-325 MG PO TABS: 1.0000 | ORAL_TABLET | Freq: Four times a day (QID) | ORAL | Status: DC | PRN

## 2015-02-18 NOTE — ED Notes (Signed)
Pt A&OX4 ambulatory at d/c with steady gait, NAD and he reports he has all of his belongings with him at d/c

## 2015-02-18 NOTE — Telephone Encounter (Signed)
Post ED Visit - Positive Culture Follow-up  Culture report reviewed by antimicrobial stewardship pharmacist:  []  Riley Simmons, Pharm.D. []  Riley Simmons, Pharm.D., BCPS []  Riley Simmons, Pharm.D. []  Riley Simmons, Pharm.D., BCPS []  Riley Simmons, [address] BoulevardPharm.D., BCPS, AAHIVP []  Riley Simmons, Pharm.D., BCPS, AAHIVP [x]  Riley Simmons, [address] BoulevardPharm.D. []  Riley Simmons, [address] BoulevardPharm.D.  Positive urine culture, 100,000 colonies -> E Coli Treated with Ciprofloxacin, organism sensitive to the same and no further patient follow-up is required at this time.  Riley Simmons, Riley Simmons 02/18/2015, 5:57 AM

## 2015-02-18 NOTE — Care Management Note (Signed)
Case Management Note  Patient Details  Name: Serafina Mitchellickolas L Lormand MRN: [id number] Date of Birth: [birth year]  Subjective/Objective:     [facility] with testicle pain with hematuria onset last night , Patient has had 5 ED visits in the past 6 months with related complaints. Patient is uninsured and reports not getting his abx because he cannot afford it. Discussed MATCH program medication assistance,and guidelines including the $3 co-pay per prescription. Patient is agreeable with medication assistance. Patient also mentioned he self caths, and reports reusing due to cost. CM explained that MATCH would not cover his catheters.  CM will follow up with patient regarding information on low cost urinary catheters. MATCH letter printed and given to patient with instructions on how to redeem. Patient [facility].  Patient verbalized understanding and appreciation.        Action/Plan:   Expected Discharge Date:    02/18/2015              Expected Discharge Plan:  Home/Self Care  In-House Referral:     Discharge planning Services  [facility], [facility]  Post Acute Care Choice:    Choice offered to:  Patient  DME Arranged:  N/A DME Agency:  NA  HH Arranged:  NA HH Agency:  NA  Status of Service:  Completed, signed off  Medicare Important Message Given:    Date Medicare IM Given:    Medicare IM give by:    Date Additional Medicare IM Given:    Additional Medicare Important Message give by:     If discussed at Long Length of Stay Meetings, dates discussed:    Additional CommentsMichel Bickers:  Aven Cegielski, RN 02/18/2015, 10:14 PM

## 2015-02-18 NOTE — Care Management Note (Signed)
Case Management Note  Patient Details  Name: Riley Simmons MRN: [id number] Date of Birth: 02-Oct-1995  Subjective/Objective:                    Action/Plan:   Expected Discharge Date:    02/18/2015              Expected Discharge Plan:  Home/Self Care  In-House Referral:     Discharge planning Services  CM Consult, [facility] LPMATCH Program  Post Acute Care Choice:    Choice offered to:  Patient  DME Arranged:  N/A DME Agency:  NA  HH Arranged:  NA HH Agency:  NA  Status of Service:  Completed, signed off  Medicare Important Message Given:    Date Medicare IM Given:    Medicare IM give by:    Date Additional Medicare IM Given:    Additional Medicare Important Message give by:     If discussed at Long Length of Stay Meetings, dates discussed:    Additional CommentsMichel Bickers:  Lealand Elting, RN 02/18/2015, 10:48 PM

## 2015-02-18 NOTE — ED Notes (Signed)
Pt. reports right testicle pain with hematuria onset last night , denies dysuria or fever , currently taking oral Cipro antibiotic , [facility] last 12/28 diagnosed with urethritis .

## 2015-02-19 NOTE — ED Provider Notes (Signed)
CSN: [id number]     Arrival date & time 02/18/15  2030 History   First MD Initiated Contact with Patient 02/18/15 2222     Chief Complaint  Patient presents with  . Testicle Pain     (Consider location/radiation/quality/duration/timing/severity/associated sxs/prior Treatment) Patient is a 20 y.o. male presenting with testicular pain.  Testicle Pain This is a new problem. The current episode started 6 to 12 hours ago. The problem occurs constantly. Pertinent negatives include no chest pain and no shortness of breath. Nothing aggravates the symptoms. Nothing relieves the symptoms. He has tried nothing for the symptoms. The treatment provided no relief.    Past Medical History  Diagnosis Date  . Spina bifida ([place])   . Scoliosis   . Urethritis   . Marijuana smoker, continuous ([place])    History reviewed. No pertinent past surgical history. No family history on file. Social History  Substance Use Topics  . Smoking status: Never Smoker   . Smokeless tobacco: None  . Alcohol Use: No    Review of Systems  Respiratory: Negative for shortness of breath.   Cardiovascular: Negative for chest pain.  Genitourinary: Positive for testicular pain. Negative for dysuria, urgency, hematuria, discharge and enuresis.  All other systems reviewed and are negative.     Allergies  Levofloxacin; Amoxicillin; and Latex  Home Medications   Prior to Admission medications   Medication Sig Start Date End Date Taking? Authorizing Provider  Catheters KIT Provide 21 self catheterization kits for PRN catheterization 11/08/13   Courtney Forcucci, PA-C  ciprofloxacin (CIPRO) 500 MG tablet Take 1 tablet (500 mg total) by mouth every 12 (twelve) hours. 02/18/15 03/04/15  Merrily Pew, MD  HYDROcodone-acetaminophen (NORCO/VICODIN) 5-325 MG tablet Take 1-2 tablets by mouth every 6 (six) hours as needed for severe pain. 02/18/15   Merrily Pew, MD  oxybutynin (DITROPAN XL) 10 MG 24 hr tablet Take 1 tablet (10 mg  total) by mouth at bedtime. 02/18/15   Merrily Pew, MD   BP 114/73 mmHg  Pulse 83  Temp(Src) 98.5 F (36.9 C) (Oral)  Resp 18  SpO2 98% Physical Exam  Constitutional: He appears well-developed and well-nourished.  HENT:  Head: Normocephalic and atraumatic.  Neck: Normal range of motion.  Cardiovascular: Normal rate.   Pulmonary/Chest: Effort normal. No respiratory distress.  Abdominal: He exhibits no distension.  Genitourinary: Penis normal. Cremasteric reflex is present. Right testis shows tenderness. Right testis shows no swelling. Cremasteric reflex is not absent on the right side. Left testis shows tenderness. Left testis shows no swelling. Cremasteric reflex is not absent on the left side.  Musculoskeletal: Normal range of motion.  Lymphadenopathy:       Right: No inguinal adenopathy present.       Left: No inguinal adenopathy present.  Neurological: He is alert.  Nursing note and vitals reviewed.   ED Course  Procedures (including critical care time) Labs Review Labs Reviewed  URINALYSIS, ROUTINE W REFLEX MICROSCOPIC (NOT AT [facility]) - Abnormal; Notable for the following:    Leukocytes, UA SMALL (*)    All other components within normal limits  CBC WITH DIFFERENTIAL/PLATELET - Abnormal; Notable for the following:    WBC 12.1 (*)    Neutro Abs 8.1 (*)    Monocytes Absolute 1.2 (*)    All other components within normal limits  COMPREHENSIVE METABOLIC PANEL - Abnormal; Notable for the following:    ALT 16 (*)    All other components within normal limits  URINE MICROSCOPIC-ADD ON -  Abnormal; Notable for the following:    Squamous Epithelial / LPF 0-5 (*)    Bacteria, UA FEW (*)    All other components within normal limits    Imaging Review No results found. I have personally reviewed and evaluated these images and lab results as part of my medical decision-making.   EKG Interpretation None      MDM   Final diagnoses:  Epididymitis    20 year old male here  with likely epididymitis. Has a urethritis unlikely for prostatitis of the last few days. On examination he has tenderness to the posterior testicle normal cremasteric no abnormal lie. I doubt that he has torsion. Urinary on Cipro extending the length of his Cipro and given a short course of pain medication and refill his other medications. Pharmacy discussed the patient's case with social work and patient was enrolled in a program to allow him to get his medications cheaper abdomen within 7 days of discharge. Will return here for any new or worsening symptoms.    Merrily Pew, MD 02/19/15 [phone]

## 2015-06-05 ENCOUNTER — Emergency Department (HOSPITAL_COMMUNITY)
Admission: EM | Admit: 2015-06-05 | Discharge: 2015-06-05 | Disposition: A | Discharge status: Home / Self Care | Payer: Self-pay | Attending: Emergency Medicine | Admitting: Emergency Medicine

## 2015-06-05 ENCOUNTER — Encounter (HOSPITAL_COMMUNITY): Payer: Self-pay | Admitting: Emergency Medicine

## 2015-06-05 ENCOUNTER — Emergency Department (HOSPITAL_COMMUNITY): Payer: Self-pay

## 2015-06-05 DIAGNOSIS — Z91040 Latex allergy status: Secondary | ICD-10-CM | POA: Insufficient documentation

## 2015-06-05 DIAGNOSIS — N12 Tubulo-interstitial nephritis, not specified as acute or chronic: Secondary | ICD-10-CM

## 2015-06-05 DIAGNOSIS — Z789 Other specified health status: Secondary | ICD-10-CM

## 2015-06-05 DIAGNOSIS — R109 Unspecified abdominal pain: Secondary | ICD-10-CM

## 2015-06-05 DIAGNOSIS — Z880 Allergy status to penicillin: Secondary | ICD-10-CM | POA: Insufficient documentation

## 2015-06-05 DIAGNOSIS — R10A0 Flank pain, unspecified side: Secondary | ICD-10-CM

## 2015-06-05 DIAGNOSIS — Q059 Spina bifida, unspecified: Secondary | ICD-10-CM | POA: Insufficient documentation

## 2015-06-05 DIAGNOSIS — Z792 Long term (current) use of antibiotics: Secondary | ICD-10-CM | POA: Insufficient documentation

## 2015-06-05 DIAGNOSIS — M419 Scoliosis, unspecified: Secondary | ICD-10-CM | POA: Insufficient documentation

## 2015-06-05 LAB — URINALYSIS, ROUTINE W REFLEX MICROSCOPIC
BILIRUBIN URINE: NEGATIVE
Glucose, UA: NEGATIVE mg/dL
KETONES UR: NEGATIVE mg/dL
Leukocytes, UA: NEGATIVE
NITRITE: NEGATIVE
PROTEIN: NEGATIVE mg/dL
Specific Gravity, Urine: 1.022 (ref 1.005–1.030)
pH: 6.5 (ref 5.0–8.0)

## 2015-06-05 LAB — COMPREHENSIVE METABOLIC PANEL
ALBUMIN: 4.2 g/dL (ref 3.5–5.0)
ALK PHOS: 66 U/L (ref 38–126)
ALT: 11 U/L — ABNORMAL LOW (ref 17–63)
ANION GAP: 9 (ref 5–15)
AST: 19 U/L (ref 15–41)
BILIRUBIN TOTAL: 0.5 mg/dL (ref 0.3–1.2)
BUN: 10 mg/dL (ref 6–20)
CALCIUM: 8.9 mg/dL (ref 8.9–10.3)
CO2: 28 mmol/L (ref 22–32)
CREATININE: 1.05 mg/dL (ref 0.61–1.24)
Chloride: 106 mmol/L (ref 101–111)
GFR calc Af Amer: >60 mL/min (ref >60)
GFR calc non Af Amer: >60 mL/min (ref >60)
GLUCOSE: 82 mg/dL (ref 65–99)
Potassium: 4.1 mmol/L (ref 3.5–5.1)
Sodium: 143 mmol/L (ref 135–145)
TOTAL PROTEIN: 6.5 g/dL (ref 6.5–8.1)

## 2015-06-05 LAB — URINE MICROSCOPIC-ADD ON

## 2015-06-05 LAB — CBC
HCT: 44.5 % (ref 39.0–52.0)
HEMOGLOBIN: 15 g/dL (ref 13.0–17.0)
MCH: 30.3 pg (ref 26.0–34.0)
MCHC: 33.7 g/dL (ref 30.0–36.0)
MCV: 89.9 fL (ref 78.0–100.0)
PLATELETS: 197 10*3/uL (ref 150–400)
RBC: 4.95 MIL/uL (ref 4.22–5.81)
RDW: 13.3 % (ref 11.5–15.5)
WBC: 7.2 10*3/uL (ref 4.0–10.5)

## 2015-06-05 LAB — I-STAT CG4 LACTIC ACID, ED: LACTIC ACID, VENOUS: 1.12 mmol/L (ref 0.5–2.0)

## 2015-06-05 MED ORDER — CLEAR ADVANTAGE SILICONE CATH MISC: 1.0000 | Status: DC | PRN

## 2015-06-05 MED ORDER — CATHETER RED RUBBER COATED MISC: 1.0000 | Status: DC | PRN

## 2015-06-05 MED ORDER — ONDANSETRON HCL 4 MG/2ML IJ SOLN
4.0000 mg | Freq: Once | INTRAMUSCULAR | Status: AC
  Administered 2015-06-05: 4 mg via INTRAVENOUS
  Filled 2015-06-05: qty 2

## 2015-06-05 MED ORDER — OXYCODONE HCL 5 MG PO TABS
10.0000 mg | ORAL_TABLET | Freq: Once | ORAL | Status: AC
  Administered 2015-06-05: 10 mg via ORAL
  Filled 2015-06-05: qty 2

## 2015-06-05 MED ORDER — OXYBUTYNIN CHLORIDE ER 10 MG PO TB24: 10.0000 mg | ORAL_TABLET | Freq: Every day | ORAL | Status: DC

## 2015-06-05 MED ORDER — CEPHALEXIN 500 MG PO CAPS: 500.0000 mg | ORAL_CAPSULE | Freq: Four times a day (QID) | ORAL | Status: DC

## 2015-06-05 MED ORDER — DEXTROSE 5 % IV SOLN
1.0000 g | Freq: Once | INTRAVENOUS | Status: AC
  Administered 2015-06-05: 1 g via INTRAVENOUS
  Filled 2015-06-05: qty 10

## 2015-06-05 NOTE — Discharge Instructions (Signed)
1. Medications: keflex, usual home medications 2. Treatment: rest, drink plenty of fluids, tylenol or ibuprofen for pain control 3. Follow Up: Please followup with the [facility] at your days for discussion of your diagnoses (including iron studies) and further evaluation after today's visit; Please return to the ER for fevers, vomiting, abd pain, or other concerns

## 2015-06-05 NOTE — Care Management Note (Signed)
Case Management Note  Patient Details  Name: Serafina Mitchellickolas L Dyar MRN: [id number] Date of Birth: 03-31-1995  Subjective/Objective:       UTI spina bifada with neurogenic bladder self cath.             Action/Plan: CM consulted to assist patient with medications and urinary catheters.  Patient is uninsured and does not have a PCP, CM spoke with patient to discuss follow up patient has agreed [facility] scheduled for 5/1 at 2p. Provided patient with resources on how to obtain sample urinary catheters. Patient verbalized understanding and appreciation for the assistance. No further questions or concerns verbalized. No further CM needs identified  Expected Discharge Date:    06/05/15            Expected Discharge Plan:  Home/Self Care  In-House Referral:     Discharge planning Services  CM Consult, Follow-up appt scheduled, [facility]  Post Acute Care Choice:    Choice offered to:  Patient  DME Arranged:    DME Agency:     HH Arranged:    HH Agency:     Status of Service:  Completed, signed off  Medicare Important Message Given:    Date Medicare IM Given:    Medicare IM give by:    Date Additional Medicare IM Given:    Additional Medicare Important Message give by:     If discussed at Long Length of Stay Meetings, dates discussed:    Additional CommentsMichel Bickers:  Kayan Blissett, RN 06/05/2015, 11:22 PM

## 2015-06-05 NOTE — ED Notes (Signed)
Pt sts hx of recent UTI and sts normally self caths and having trouble getting any urine when cathing self but sts then can urinate on his own right after and has lower back pain

## 2015-06-05 NOTE — ED Provider Notes (Signed)
CSN: [id number]     Arrival date & time 06/05/15  1533 History   First MD Initiated Contact with Patient 06/05/15 1852     Chief Complaint  Patient presents with  . Urinary Tract Infection     (Consider location/radiation/quality/duration/timing/severity/associated sxs/prior Treatment) The history is provided by the patient, medical records and a parent. No language interpreter was used.     Riley Simmons is a 20 y.o. male  with a hx of spina bifida with subsequent neurogenic bladder requiring self cath for his entire life presents to the Emergency Department complaining of gradual, persistent, progressively worsening right-sided flank pain onset approximately 1-2 days ago worsening today. He reports that approximately one week ago he began having UTI symptoms including urinary urgency and frequency. He reports that he has also had bloody urine. He states this is not normal during his self cath. Patient reports that he had some leftover Cipro which he began 3 days prior to a visit to the ED and he then on Saturday (4 days ago).  She reports they gave him several more days of ciprofloxacin which he has continued taking. Patient reports that his symptoms are not improving. Patient reports that he has previously taken Cipro with improvement of his symptoms. He reports that he was last hospitalized for pyelonephritis and 2014. He reports intermittent chills at night and feeling hot but no measured fevers. He also endorses associated nausea without vomiting. No painful bowel movements, testicular pain, penile pain or penile discharge.   Patient denies headache or neck pain, chest pain, shortness of breath, abdominal pain, weakness, dizziness, syncope.  Pt denies history of kidney stone.   Past Medical History  Diagnosis Date  . Spina bifida ([place])   . Scoliosis   . Urethritis   . Marijuana smoker, continuous ([place])    History reviewed. No pertinent past surgical history. History reviewed. No  pertinent family history. Social History  Substance Use Topics  . Smoking status: Never Smoker   . Smokeless tobacco: None  . Alcohol Use: No    Review of Systems  Constitutional: Negative for fever, diaphoresis, appetite change, fatigue and unexpected weight change.  HENT: Negative for mouth sores.   Eyes: Negative for visual disturbance.  Respiratory: Negative for cough, chest tightness, shortness of breath and wheezing.   Cardiovascular: Negative for chest pain.  Gastrointestinal: Positive for nausea. Negative for vomiting, abdominal pain, diarrhea and constipation.  Endocrine: Negative for polydipsia, polyphagia and polyuria.  Genitourinary: Positive for urgency, frequency, hematuria and flank pain. Negative for dysuria.  Musculoskeletal: Positive for back pain. Negative for neck stiffness.  Skin: Negative for rash.  Allergic/Immunologic: Negative for immunocompromised state.  Neurological: Negative for syncope, light-headedness and headaches.  Hematological: Does not bruise/bleed easily.  Psychiatric/Behavioral: Negative for sleep disturbance. The patient is not nervous/anxious.       Allergies  Levofloxacin; Amoxicillin; and Latex  Home Medications   Prior to Admission medications   Medication Sig Start Date End Date Taking? Authorizing Provider  ciprofloxacin (CIPRO) 500 MG tablet Take 500 mg by mouth 2 (two) times daily.   Yes Historical Provider, MD  Catheters (CLEAR ADVANTAGE SILICONE CATH) MISC 1 Device by Does not apply route as needed. 06/05/15   Abigail Butts, PA-C  Catheters KIT Provide 21 self catheterization kits for PRN catheterization 11/08/13   Courtney Forcucci, PA-C  cephALEXin (KEFLEX) 500 MG capsule Take 1 capsule (500 mg total) by mouth 4 (four) times daily. 06/05/15   Jarrett Soho Camden Knotek, PA-C  HYDROcodone-acetaminophen (  NORCO/VICODIN) 5-325 MG tablet Take 1-2 tablets by mouth every 6 (six) hours as needed for severe pain. 02/18/15   Merrily Pew, MD    oxybutynin (DITROPAN XL) 10 MG 24 hr tablet Take 1 tablet (10 mg total) by mouth at bedtime. 06/05/15   Caydan Mctavish, PA-C   BP 150/90 mmHg  Pulse 86  Temp(Src) 97.5 F (36.4 C) (Oral)  Resp 20  Ht '5\' 8"'  (1.727 m)  Wt 96.616 kg  BMI 32.39 kg/m2  SpO2 98% Physical Exam  Constitutional: He appears well-developed and well-nourished. No distress.  HENT:  Head: Normocephalic and atraumatic.  Mouth/Throat: Oropharynx is clear and moist. No oropharyngeal exudate.  Cardiovascular: Normal rate, regular rhythm, normal heart sounds and intact distal pulses.   Pulmonary/Chest: Effort normal and breath sounds normal. No respiratory distress. He has no wheezes.  Abdominal: Soft. Bowel sounds are normal. He exhibits no distension and no mass. There is tenderness ( right sided). There is CVA tenderness (right). There is no rebound and no guarding. Hernia confirmed negative in the right inguinal area and confirmed negative in the left inguinal area.    Genitourinary: Testes normal and penis normal. Right testis shows no mass, no swelling and no tenderness. Right testis is descended. Cremasteric reflex is not absent on the right side. Left testis shows no mass, no swelling and no tenderness. Left testis is descended. Cremasteric reflex is not absent on the left side. No phimosis, paraphimosis, hypospadias, penile erythema or penile tenderness. No discharge found.  Musculoskeletal: Normal range of motion. He exhibits no edema.  Well healed surgical incision over the L-spine without midline tenderness  Lymphadenopathy:       Right: No inguinal adenopathy present.       Left: No inguinal adenopathy present.  Neurological: He is alert.  Skin: Skin is warm and dry. No rash noted. He is not diaphoretic.  Psychiatric: He has a normal mood and affect.  Nursing note and vitals reviewed.   ED Course  Procedures (including critical care time) Labs Review Labs Reviewed  COMPREHENSIVE METABOLIC PANEL -  Abnormal; Notable for the following:    ALT 11 (*)    All other components within normal limits  URINALYSIS, ROUTINE W REFLEX MICROSCOPIC (NOT AT [facility]) - Abnormal; Notable for the following:    Hgb urine dipstick MODERATE (*)    All other components within normal limits  URINE MICROSCOPIC-ADD ON - Abnormal; Notable for the following:    Squamous Epithelial / LPF 0-5 (*)    Bacteria, UA FEW (*)    All other components within normal limits  URINE CULTURE  CBC  I-STAT CG4 LACTIC ACID, ED  I-STAT CG4 LACTIC ACID, ED    Imaging Review Ct Renal Stone Study  06/05/2015  CLINICAL DATA:  Right flank pain and urinary urgency. Patient self catheterize is but having trouble getting urine. History of urinary tract infections and spina bifida. EXAM: CT ABDOMEN AND PELVIS WITHOUT CONTRAST TECHNIQUE: Multidetector CT imaging of the abdomen and pelvis was performed following the standard protocol without IV contrast. COMPARISON:  None. FINDINGS: The lung bases are clear. Left kidney is somewhat atrophic compared to the right. No hydronephrosis or hydroureter. No stranding around the kidneys. No renal, ureteral, or bladder stones identified. The bladder wall is mildly thickened, possibly indicating cystitis. There is a bladder diverticulum off the right anterior and left posterior aspects. Tiny amount of gas in the bladder is likely related to catheterization. Mildly increased density of the liver could indicate  iron overload. Clinical correlation suggested. Unenhanced appearance of the gallbladder, spleen, pancreas, adrenal glands, abdominal aorta, inferior vena cava, and retroperitoneal lymph nodes is unremarkable. Stomach, small bowel, and colon are mostly decompressed with stool filling the colon. No free air or free fluid in the abdomen. Pelvis: The appendix is not identified. Prostate gland is mildly enlarged at 4 cm diameter. Seminal vesicles are mildly prominent. No free or loculated pelvic fluid  collections. No pelvic lymphadenopathy. No destructive bone lesions. IMPRESSION: Mild bladder wall thickening may indicate cystitis. Small bladder diverticulum. No renal or ureteral stone or obstruction. Mildly increased density in the liver may indicate iron overload. The correlation recommended. Mildly enlarged prostate gland. Electronically Signed   By: Lucienne Capers M.D.   On: 06/05/2015 21:52   I have personally reviewed and evaluated these images and lab results as part of my medical decision-making.    MDM   Final diagnoses:  Flank pain  Pyelonephritis  Self-catheterizes urinary bladder   Rahkeem Aura Camps presents with flank pain and UTI ssx after outpatient tx with cipro.  Pt is without fever, vomiting or tachycardia to suggest SIRS or sepsis.  Normal lactic acid and no leukocytosis.  Urinalysis is without overt infection however patient is partially treated. Patient given Rocephin here in the emergency department and outpatient medications switched to Keflex as I suspect the reason for his outpatient failure is antibiotic resistance.  CT scan shows mild bladder wall thickening but no renal or ureteral stone.  He does have mild increased density in the liver which is suggested to be potential iron overload. He is to follow with community [facility] and wellness for further evaluation of this. His abdomen is soft and nontender without rebound or guarding.  She does well appearing and wishes for discharge home. Discussed findings and plan. Patient states understanding and is agreeable. Home medications refilled including prescription for catheters.  BP 150/90 mmHg  Pulse 86  Temp(Src) 97.5 F (36.4 C) (Oral)  Resp 20  Ht '5\' 8"'  (1.727 m)  Wt 96.616 kg  BMI 32.39 kg/m2  SpO2 98%   Abigail Butts, PA-C 06/05/15 2336  Noemi Chapel, MD 06/06/15 1005

## 2015-06-07 LAB — URINE CULTURE

## 2015-06-18 ENCOUNTER — Inpatient Hospital Stay: Payer: Self-pay | Admitting: Family Medicine

## 2015-10-01 ENCOUNTER — Emergency Department (HOSPITAL_COMMUNITY)
Admission: EM | Admit: 2015-10-01 | Discharge: 2015-10-01 | Disposition: A | Discharge status: Home / Self Care | Payer: Self-pay | Attending: Emergency Medicine | Admitting: Emergency Medicine

## 2015-10-01 ENCOUNTER — Encounter (HOSPITAL_COMMUNITY): Payer: Self-pay | Admitting: *Deleted

## 2015-10-01 DIAGNOSIS — K529 Noninfective gastroenteritis and colitis, unspecified: Secondary | ICD-10-CM | POA: Insufficient documentation

## 2015-10-01 DIAGNOSIS — R112 Nausea with vomiting, unspecified: Secondary | ICD-10-CM | POA: Insufficient documentation

## 2015-10-01 DIAGNOSIS — Z91040 Latex allergy status: Secondary | ICD-10-CM | POA: Insufficient documentation

## 2015-10-01 DIAGNOSIS — F17200 Nicotine dependence, unspecified, uncomplicated: Secondary | ICD-10-CM | POA: Insufficient documentation

## 2015-10-01 LAB — CBC
HCT: 48.4 % (ref 39.0–52.0)
Hemoglobin: 16.2 g/dL (ref 13.0–17.0)
MCH: 30.5 pg (ref 26.0–34.0)
MCHC: 33.5 g/dL (ref 30.0–36.0)
MCV: 91.1 fL (ref 78.0–100.0)
PLATELETS: 212 10*3/uL (ref 150–400)
RBC: 5.31 MIL/uL (ref 4.22–5.81)
RDW: 12.8 % (ref 11.5–15.5)
WBC: 7.7 10*3/uL (ref 4.0–10.5)

## 2015-10-01 LAB — COMPREHENSIVE METABOLIC PANEL
ALBUMIN: 4.5 g/dL (ref 3.5–5.0)
ALK PHOS: 56 U/L (ref 38–126)
ALT: 12 U/L — AB (ref 17–63)
AST: 16 U/L (ref 15–41)
Anion gap: 7 (ref 5–15)
BILIRUBIN TOTAL: 1.1 mg/dL (ref 0.3–1.2)
BUN: 13 mg/dL (ref 6–20)
CALCIUM: 9.2 mg/dL (ref 8.9–10.3)
CO2: 25 mmol/L (ref 22–32)
CREATININE: 1.06 mg/dL (ref 0.61–1.24)
Chloride: 105 mmol/L (ref 101–111)
Glucose, Bld: 97 mg/dL (ref 65–99)
Potassium: 4.1 mmol/L (ref 3.5–5.1)
SODIUM: 137 mmol/L (ref 135–145)
Total Protein: 6.9 g/dL (ref 6.5–8.1)

## 2015-10-01 LAB — URINALYSIS, ROUTINE W REFLEX MICROSCOPIC
Bilirubin Urine: NEGATIVE
Glucose, UA: NEGATIVE mg/dL
HGB URINE DIPSTICK: NEGATIVE
KETONES UR: NEGATIVE mg/dL
LEUKOCYTES UA: NEGATIVE
Nitrite: NEGATIVE
PROTEIN: NEGATIVE mg/dL
Specific Gravity, Urine: 1.026 (ref 1.005–1.030)
pH: 7 (ref 5.0–8.0)

## 2015-10-01 LAB — LIPASE, BLOOD: Lipase: 19 U/L (ref 11–51)

## 2015-10-01 MED ORDER — ONDANSETRON 4 MG PO TBDP
4.0000 mg | ORAL_TABLET | Freq: Once | ORAL | Status: AC | PRN
  Administered 2015-10-01: 4 mg via ORAL

## 2015-10-01 MED ORDER — FAMOTIDINE 20 MG PO TABS: 20.0000 mg | ORAL_TABLET | Freq: Two times a day (BID) | ORAL | 0 refills | Status: DC

## 2015-10-01 MED ORDER — ONDANSETRON HCL 4 MG PO TABS
4.0000 mg | ORAL_TABLET | Freq: Four times a day (QID) | ORAL | 0 refills | Status: DC
Start: 2015-10-01 — End: 2016-11-09

## 2015-10-01 MED ORDER — ONDANSETRON 4 MG PO TBDP
ORAL_TABLET | ORAL | Status: AC
  Filled 2015-10-01: qty 1

## 2015-10-01 NOTE — ED Provider Notes (Signed)
[facility] DEPT Provider Note   CSN: [id number] Arrival date & time: 10/01/15  1531     History   Chief Complaint Chief Complaint  Patient presents with  . Nausea  . Emesis  . Diarrhea    HPI Riley BOBIER is a 20 y.o. male.  HPI Patient has several episodes of vomiting day before yesterday. He also had some diarrhea at that time. He was better yesterday. He reports today he again had 1-2 episodes of vomiting. He reports he had to leave work early around lunch time due to his symptoms. He reports they made him come to the hospital for evaluation since he was leaving work. He reports he has some generalized abdominal cramping and discomfort.Reports he's had some chills but no fever documented. Past Medical History:  Diagnosis Date  . Marijuana smoker, continuous ([place])   . Scoliosis   . Spina bifida ([place])   . Urethritis     There are no active problems to display for this patient.   History reviewed. No pertinent surgical history.     Home Medications    Prior to Admission medications   Medication Sig Start Date End Date Taking? Authorizing Provider  Catheters (CLEAR ADVANTAGE SILICONE CATH) MISC 1 Device by Does not apply route as needed. 06/05/15   Abigail Butts, PA-C  Catheters KIT Provide 21 self catheterization kits for PRN catheterization 11/08/13   Courtney Forcucci, PA-C  cephALEXin (KEFLEX) 500 MG capsule Take 1 capsule (500 mg total) by mouth 4 (four) times daily. 06/05/15   Hannah Muthersbaugh, PA-C  ciprofloxacin (CIPRO) 500 MG tablet Take 500 mg by mouth 2 (two) times daily.    Historical Provider, MD  famotidine (PEPCID) 20 MG tablet Take 1 tablet (20 mg total) by mouth 2 (two) times daily. 10/01/15   Charlesetta Shanks, MD  HYDROcodone-acetaminophen (NORCO/VICODIN) 5-325 MG tablet Take 1-2 tablets by mouth every 6 (six) hours as needed for severe pain. 02/18/15   Merrily Pew, MD  ondansetron (ZOFRAN) 4 MG tablet Take 1 tablet (4 mg total) by mouth  every 6 (six) hours. 10/01/15   Charlesetta Shanks, MD  oxybutynin (DITROPAN XL) 10 MG 24 hr tablet Take 1 tablet (10 mg total) by mouth at bedtime. 06/05/15   Jarrett Soho Muthersbaugh, PA-C    Family History History reviewed. No pertinent family history.  Social History Social History  Substance Use Topics  . Smoking status: Current Some Day Smoker  . Smokeless tobacco: Never Used  . Alcohol use Yes     Allergies   Levofloxacin; Amoxicillin; and Latex   Review of Systems Review of Systems 10 Systems reviewed and are negative for acute change except as noted in the HPI.   Physical Exam Updated Vital Signs BP 125/78 (BP Location: Right Arm)   Pulse 64   Temp 98.4 F (36.9 C) (Oral)   Resp 18   Ht _0  (1.651 m)   Wt 130 lb (59 kg)   SpO2 98%   BMI 21.63 kg/m   Physical Exam  Constitutional: He is oriented to person, place, and time. He appears well-developed and well-nourished. No distress.  HENT:  Head: Normocephalic and atraumatic.  Eyes: Conjunctivae and EOM are normal.  Cardiovascular: Normal rate, regular rhythm, normal heart sounds and intact distal pulses.   Pulmonary/Chest: Effort normal and breath sounds normal.  Abdominal: Soft. Bowel sounds are normal.  Mild diffuse tenderness. No guarding or rebound. No localizing pain.  Musculoskeletal: Normal range of motion. He exhibits no edema or  tenderness.  The patient has some deformity of the back and spine. He however does not have severe findings.  Neurological: He is alert and oriented to person, place, and time. He exhibits normal muscle tone. Coordination normal.  Skin: Skin is warm and dry.  Psychiatric: He has a normal mood and affect.     ED Treatments / Results  Labs (all labs ordered are listed, but only abnormal results are displayed) Labs Reviewed  COMPREHENSIVE METABOLIC PANEL - Abnormal; Notable for the following:       Result Value   ALT 12 (*)    All other components within normal limits    LIPASE, BLOOD  CBC  URINALYSIS, ROUTINE W REFLEX MICROSCOPIC (NOT AT [facility])    EKG  EKG Interpretation None       Radiology No results found.  Procedures Procedures (including critical care time)  Medications Ordered in ED Medications  ondansetron (ZOFRAN-ODT) 4 MG disintegrating tablet (not administered)  ondansetron (ZOFRAN-ODT) disintegrating tablet 4 mg (4 mg Oral Given 10/01/15 1549)     Initial Impression / Assessment and Plan / ED Course  I have reviewed the triage vital signs and the nursing notes.  Pertinent labs & imaging results that were available during my care of the patient were reviewed by me and considered in my medical decision making (see chart for details).  Clinical Course     Final Clinical Impressions(s) / ED Diagnoses   Final diagnoses:  Gastroenteritis   Patient is well appearance. Vital signs are normal. Diagnostic evaluation is negative. At this time findings are most suggestive of a viral gastroenteritis. Patient will be provided with  Zofran for nausea and Pepcid for gastritis symptoms. New Prescriptions New Prescriptions   FAMOTIDINE (PEPCID) 20 MG TABLET    Take 1 tablet (20 mg total) by mouth 2 (two) times daily.   ONDANSETRON (ZOFRAN) 4 MG TABLET    Take 1 tablet (4 mg total) by mouth every 6 (six) hours.     Charlesetta Shanks, MD 10/01/15 [id number]

## 2015-10-01 NOTE — ED Triage Notes (Signed)
Pt c/o n/v/d and chills for two days. Pt states boss made him come to ED to be evaluated.

## 2015-11-09 ENCOUNTER — Encounter (HOSPITAL_COMMUNITY): Payer: Self-pay | Admitting: Emergency Medicine

## 2015-11-09 ENCOUNTER — Emergency Department (HOSPITAL_COMMUNITY): Payer: Self-pay

## 2015-11-09 ENCOUNTER — Emergency Department (HOSPITAL_COMMUNITY)
Admission: EM | Admit: 2015-11-09 | Discharge: 2015-11-09 | Disposition: A | Discharge status: Home / Self Care | Payer: Self-pay | Attending: Emergency Medicine | Admitting: Emergency Medicine

## 2015-11-09 DIAGNOSIS — Y999 Unspecified external cause status: Secondary | ICD-10-CM | POA: Insufficient documentation

## 2015-11-09 DIAGNOSIS — Y92410 Unspecified street and highway as the place of occurrence of the external cause: Secondary | ICD-10-CM | POA: Insufficient documentation

## 2015-11-09 DIAGNOSIS — Y939 Activity, unspecified: Secondary | ICD-10-CM | POA: Insufficient documentation

## 2015-11-09 DIAGNOSIS — S32020A Wedge compression fracture of second lumbar vertebra, initial encounter for closed fracture: Secondary | ICD-10-CM

## 2015-11-09 DIAGNOSIS — S93401A Sprain of unspecified ligament of right ankle, initial encounter: Secondary | ICD-10-CM | POA: Insufficient documentation

## 2015-11-09 DIAGNOSIS — F17200 Nicotine dependence, unspecified, uncomplicated: Secondary | ICD-10-CM | POA: Insufficient documentation

## 2015-11-09 DIAGNOSIS — Z91040 Latex allergy status: Secondary | ICD-10-CM | POA: Insufficient documentation

## 2015-11-09 DIAGNOSIS — S93409A Sprain of unspecified ligament of unspecified ankle, initial encounter: Secondary | ICD-10-CM

## 2015-11-09 DIAGNOSIS — S22028A Other fracture of second thoracic vertebra, initial encounter for closed fracture: Secondary | ICD-10-CM | POA: Insufficient documentation

## 2015-11-09 LAB — ETHANOL

## 2015-11-09 MED ORDER — CYCLOBENZAPRINE HCL 10 MG PO TABS
5.0000 mg | ORAL_TABLET | Freq: Once | ORAL | Status: AC
  Administered 2015-11-09: 5 mg via ORAL
  Filled 2015-11-09: qty 1

## 2015-11-09 MED ORDER — IBUPROFEN 800 MG PO TABS
800.0000 mg | ORAL_TABLET | Freq: Once | ORAL | Status: AC
  Administered 2015-11-09: 800 mg via ORAL
  Filled 2015-11-09: qty 1

## 2015-11-09 MED ORDER — OXYCODONE-ACETAMINOPHEN 5-325 MG PO TABS: 1.0000 | ORAL_TABLET | Freq: Four times a day (QID) | ORAL | 0 refills | Status: DC | PRN

## 2015-11-09 MED ORDER — MORPHINE SULFATE (PF) 4 MG/ML IV SOLN
4.0000 mg | Freq: Once | INTRAVENOUS | Status: AC
  Administered 2015-11-09: 4 mg via INTRAMUSCULAR
  Filled 2015-11-09: qty 1

## 2015-11-09 MED ORDER — OXYCODONE-ACETAMINOPHEN 5-325 MG PO TABS
1.0000 | ORAL_TABLET | Freq: Once | ORAL | Status: AC
  Administered 2015-11-09: 1 via ORAL
  Filled 2015-11-09: qty 1
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
## 2015-11-09 NOTE — ED Notes (Addendum)
Ortho paged to apply lumber corsett - per Chrislyn, RN has been ordered already from [employer].

## 2015-11-09 NOTE — ED Notes (Signed)
Pt is in stable condition upon d/c and is escorted from ED via wheelchair. 

## 2015-11-09 NOTE — ED Notes (Signed)
Spoke with [facility] for lumbar corsett. Per ortho-tech "day shift to call [employer] and have it delivered."

## 2015-11-09 NOTE — ED Notes (Signed)
[facility] estimated to arrive around 1030. Patient and family updated.
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
## 2015-11-09 NOTE — ED Notes (Signed)
ortho tech called again to say secretary to call [employer] for lumbar corsett.
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
## 2015-11-09 NOTE — Progress Notes (Signed)
[facility] Progress Note Patient Details:  Riley Simmons [birth year] [id number]  Patient ID: Riley Simmons, male   DOB: [birth year], 20 y.o.   MRN: [id number]   Saul FordyceJennifer C Raeley Gilmore 11/09/2015, 8:07 AMCalled [employer] for Lumbar Corset brace.

## 2015-12-20 ENCOUNTER — Emergency Department (HOSPITAL_COMMUNITY)
Admission: EM | Admit: 2015-12-20 | Discharge: 2015-12-20 | Disposition: A | Discharge status: Home / Self Care | Payer: Self-pay | Attending: Emergency Medicine | Admitting: Emergency Medicine

## 2015-12-20 ENCOUNTER — Encounter (HOSPITAL_COMMUNITY): Payer: Self-pay | Admitting: Emergency Medicine

## 2015-12-20 ENCOUNTER — Emergency Department (HOSPITAL_COMMUNITY): Payer: Self-pay

## 2015-12-20 DIAGNOSIS — N342 Other urethritis: Secondary | ICD-10-CM | POA: Insufficient documentation

## 2015-12-20 DIAGNOSIS — Z91040 Latex allergy status: Secondary | ICD-10-CM | POA: Insufficient documentation

## 2015-12-20 DIAGNOSIS — F17200 Nicotine dependence, unspecified, uncomplicated: Secondary | ICD-10-CM | POA: Insufficient documentation

## 2015-12-20 DIAGNOSIS — M25571 Pain in right ankle and joints of right foot: Secondary | ICD-10-CM | POA: Insufficient documentation

## 2015-12-20 DIAGNOSIS — M549 Dorsalgia, unspecified: Secondary | ICD-10-CM | POA: Insufficient documentation

## 2015-12-20 LAB — URINALYSIS, ROUTINE W REFLEX MICROSCOPIC
Bilirubin Urine: NEGATIVE
GLUCOSE, UA: NEGATIVE mg/dL
HGB URINE DIPSTICK: NEGATIVE
Ketones, ur: NEGATIVE mg/dL
Leukocytes, UA: NEGATIVE
Nitrite: NEGATIVE
Protein, ur: NEGATIVE mg/dL
SPECIFIC GRAVITY, URINE: 1.017 (ref 1.005–1.030)
pH: 6 (ref 5.0–8.0)

## 2015-12-20 MED ORDER — PHENAZOPYRIDINE HCL 100 MG PO TABS
95.0000 mg | ORAL_TABLET | Freq: Once | ORAL | Status: AC
  Administered 2015-12-20: 100 mg via ORAL
  Filled 2015-12-20: qty 1

## 2015-12-20 MED ORDER — KETOROLAC TROMETHAMINE 60 MG/2ML IM SOLN
60.0000 mg | Freq: Once | INTRAMUSCULAR | Status: DC
  Filled 2015-12-20: qty 2

## 2015-12-20 MED ORDER — HYDROCODONE-ACETAMINOPHEN 5-325 MG PO TABS: 1.0000 | ORAL_TABLET | ORAL | 0 refills | Status: DC | PRN
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
## 2015-12-20 NOTE — Discharge Instructions (Signed)
Your x-rays today appear improved from prior. Your urine did now show any signs of infection. Take pain medication as prescribed.  Do not drive while taking this. Follow-up with your regular doctor. Return here for new concerns.

## 2016-02-18 ENCOUNTER — Encounter (HOSPITAL_COMMUNITY): Payer: Self-pay | Admitting: *Deleted

## 2016-02-18 DIAGNOSIS — R300 Dysuria: Secondary | ICD-10-CM | POA: Insufficient documentation

## 2016-02-18 DIAGNOSIS — Z79899 Other long term (current) drug therapy: Secondary | ICD-10-CM | POA: Insufficient documentation

## 2016-02-18 DIAGNOSIS — Z91040 Latex allergy status: Secondary | ICD-10-CM | POA: Insufficient documentation

## 2016-02-18 DIAGNOSIS — R350 Frequency of micturition: Secondary | ICD-10-CM | POA: Insufficient documentation

## 2016-02-18 DIAGNOSIS — F17200 Nicotine dependence, unspecified, uncomplicated: Secondary | ICD-10-CM | POA: Insufficient documentation

## 2016-02-18 DIAGNOSIS — Z960 Presence of urogenital implants: Secondary | ICD-10-CM | POA: Insufficient documentation

## 2016-02-18 LAB — URINALYSIS, ROUTINE W REFLEX MICROSCOPIC
BILIRUBIN URINE: NEGATIVE
Glucose, UA: NEGATIVE mg/dL
KETONES UR: NEGATIVE mg/dL
LEUKOCYTES UA: NEGATIVE
Nitrite: NEGATIVE
Protein, ur: 30 mg/dL — AB
SPECIFIC GRAVITY, URINE: 1.016 (ref 1.005–1.030)
pH: 6 (ref 5.0–8.0)

## 2016-02-18 LAB — COMPREHENSIVE METABOLIC PANEL
ALT: 11 U/L — ABNORMAL LOW (ref 17–63)
ANION GAP: 6 (ref 5–15)
AST: 16 U/L (ref 15–41)
Albumin: 4.3 g/dL (ref 3.5–5.0)
Alkaline Phosphatase: 61 U/L (ref 38–126)
BUN: 11 mg/dL (ref 6–20)
CHLORIDE: 103 mmol/L (ref 101–111)
CO2: 28 mmol/L (ref 22–32)
Calcium: 9.2 mg/dL (ref 8.9–10.3)
Creatinine, Ser: 1.33 mg/dL — ABNORMAL HIGH (ref 0.61–1.24)
GFR calc non Af Amer: >60 mL/min (ref >60)
Glucose, Bld: 91 mg/dL (ref 65–99)
Potassium: 4 mmol/L (ref 3.5–5.1)
SODIUM: 137 mmol/L (ref 135–145)
Total Bilirubin: 0.9 mg/dL (ref 0.3–1.2)
Total Protein: 6.8 g/dL (ref 6.5–8.1)

## 2016-02-18 LAB — CBC WITH DIFFERENTIAL/PLATELET
BASOS PCT: 0 %
Basophils Absolute: 0 10*3/uL (ref 0.0–0.1)
EOS ABS: 0.3 10*3/uL (ref 0.0–0.7)
EOS PCT: 3 %
HCT: 45 % (ref 39.0–52.0)
Hemoglobin: 15.7 g/dL (ref 13.0–17.0)
LYMPHS ABS: 2.5 10*3/uL (ref 0.7–4.0)
Lymphocytes Relative: 25 %
MCH: 31.7 pg (ref 26.0–34.0)
MCHC: 34.9 g/dL (ref 30.0–36.0)
MCV: 90.7 fL (ref 78.0–100.0)
Monocytes Absolute: 0.6 10*3/uL (ref 0.1–1.0)
Monocytes Relative: 7 %
Neutro Abs: 6.4 10*3/uL (ref 1.7–7.7)
Neutrophils Relative %: 65 %
PLATELETS: 233 10*3/uL (ref 150–400)
RBC: 4.96 MIL/uL (ref 4.22–5.81)
RDW: 12.8 % (ref 11.5–15.5)
WBC: 9.9 10*3/uL (ref 4.0–10.5)

## 2016-02-18 NOTE — ED Triage Notes (Signed)
Pt has had 2-3 days of lower back pain and urinary frequency.  Pt has to self cath and due to financial constraints he has been re using cath.  He states that he thinks he has a UTI.

## 2016-02-19 ENCOUNTER — Emergency Department (HOSPITAL_COMMUNITY)
Admission: EM | Admit: 2016-02-19 | Discharge: 2016-02-19 | Disposition: A | Discharge status: Home / Self Care | Payer: Self-pay | Attending: Emergency Medicine | Admitting: Emergency Medicine

## 2016-02-19 DIAGNOSIS — R300 Dysuria: Secondary | ICD-10-CM

## 2016-02-19 DIAGNOSIS — Z789 Other specified health status: Secondary | ICD-10-CM

## 2016-02-19 DIAGNOSIS — R350 Frequency of micturition: Secondary | ICD-10-CM

## 2016-02-19 MED ORDER — PHENAZOPYRIDINE HCL 200 MG PO TABS: 200.0000 mg | ORAL_TABLET | Freq: Three times a day (TID) | ORAL | 0 refills | Status: DC

## 2016-02-19 MED ORDER — PHENAZOPYRIDINE HCL 100 MG PO TABS
95.0000 mg | ORAL_TABLET | Freq: Once | ORAL | Status: AC
  Administered 2016-02-19: 100 mg via ORAL
  Filled 2016-02-19: qty 1

## 2016-02-19 MED ORDER — SULFAMETHOXAZOLE-TRIMETHOPRIM 800-160 MG PO TABS: 1.0000 | ORAL_TABLET | Freq: Two times a day (BID) | ORAL | 0 refills | Status: AC

## 2016-02-19 MED ORDER — SULFAMETHOXAZOLE-TRIMETHOPRIM 800-160 MG PO TABS
1.0000 | ORAL_TABLET | Freq: Once | ORAL | Status: AC
  Administered 2016-02-19: 1 via ORAL
  Filled 2016-02-19: qty 1

## 2016-02-19 NOTE — ED Provider Notes (Signed)
[facility] DEPT Provider Note   CSN: [id number] Arrival date & time: 02/18/16  1850   History   Chief Complaint Chief Complaint  Patient presents with  . Recurrent UTI  . Back Pain    HPI Riley Simmons is a 21 y.o. male.  HPI   Patient has a past medical history of spina bifida with neurogenic bladder requiring self-catheterization since the age of 36. He comes to the ER complaining of urinary frequency, having to use the restroom every 2 hours for the last couple of days, As well as dysuria and a smell to his urine. He states that he cannot afford fresh catheters and therefore washes and reuses catheter that he has. He declines being given catheters here in the ER. He denies having any abdominal pain, back pain, nausea, vomiting, diarrhea, fevers, weakness.  Past Medical History:  Diagnosis Date  . Marijuana smoker, continuous ([place])   . Scoliosis   . Spina bifida ([place])   . Urethritis     There are no active problems to display for this patient.   History reviewed. No pertinent surgical history.     Home Medications    Prior to Admission medications   Medication Sig Start Date End Date Taking? Authorizing Provider  Catheters (CLEAR ADVANTAGE SILICONE CATH) MISC 1 Device by Does not apply route as needed. 06/05/15   Abigail Butts, PA-C  Catheters KIT Provide 21 self catheterization kits for PRN catheterization 11/08/13   Courtney Forcucci, PA-C  cephALEXin (KEFLEX) 500 MG capsule Take 1 capsule (500 mg total) by mouth 4 (four) times daily. Patient not taking: Reported on 11/09/2015 06/05/15   Jarrett Soho Muthersbaugh, PA-C  famotidine (PEPCID) 20 MG tablet Take 1 tablet (20 mg total) by mouth 2 (two) times daily. Patient not taking: Reported on 11/09/2015 10/01/15   Charlesetta Shanks, MD  HYDROcodone-acetaminophen (NORCO/VICODIN) 5-325 MG tablet Take 1 tablet by mouth every 4 (four) hours as needed. 12/20/15   Larene Pickett, PA-C  ondansetron (ZOFRAN) 4 MG tablet Take 1  tablet (4 mg total) by mouth every 6 (six) hours. Patient not taking: Reported on 11/09/2015 10/01/15   Charlesetta Shanks, MD  oxybutynin (DITROPAN XL) 10 MG 24 hr tablet Take 1 tablet (10 mg total) by mouth at bedtime. Patient not taking: Reported on 11/09/2015 06/05/15   Jarrett Soho Muthersbaugh, PA-C  oxyCODONE-acetaminophen (PERCOCET/ROXICET) 5-325 MG tablet Take 1-2 tablets by mouth every 6 (six) hours as needed for severe pain. 11/09/15   Merryl Hacker, MD  phenazopyridine (PYRIDIUM) 200 MG tablet Take 1 tablet (200 mg total) by mouth 3 (three) times daily. 02/19/16   Garvey Westcott Carlota Raspberry, PA-C  sulfamethoxazole-trimethoprim (BACTRIM DS,SEPTRA DS) 800-160 MG tablet Take 1 tablet by mouth 2 (two) times daily. 02/19/16 02/26/16  Delos Haring, PA-C    Family History No family history on file.  Social History Social History  Substance Use Topics  . Smoking status: Current Some Day Smoker  . Smokeless tobacco: Never Used  . Alcohol use Yes     Allergies   Levofloxacin; Amoxicillin; and Latex   Review of Systems Review of Systems Review of Systems All other systems negative except as documented in the HPI. All pertinent positives and negatives as reviewed in the HPI.   Physical Exam Updated Vital Signs BP 126/82   Pulse 72   Temp 97.9 F (36.6 C) (Oral)   Resp 18   Ht '5\' 5"'  (1.651 m)   Wt 61.2 kg   SpO2 97%   BMI 22.47  kg/m   Physical Exam  Constitutional: He appears well-developed and well-nourished. No distress.  HENT:  Head: Normocephalic and atraumatic.  Eyes: Pupils are equal, round, and reactive to light.  Neck: Normal range of motion. Neck supple.  Cardiovascular: Normal rate and regular rhythm.   Pulmonary/Chest: Effort normal.  Abdominal: Soft. Bowel sounds are normal. There is no tenderness. There is no rigidity, no rebound, no guarding and no CVA tenderness.  Neurological: He is alert.  Skin: Skin is warm and dry.  Nursing note and vitals reviewed.    ED Treatments  / Results  Labs (all labs ordered are listed, but only abnormal results are displayed) Labs Reviewed  URINALYSIS, ROUTINE W REFLEX MICROSCOPIC - Abnormal; Notable for the following:       Result Value   Hgb urine dipstick LARGE (*)    Protein, ur 30 (*)    Bacteria, UA RARE (*)    Squamous Epithelial / LPF 0-5 (*)    Crystals PRESENT (*)    All other components within normal limits  COMPREHENSIVE METABOLIC PANEL - Abnormal; Notable for the following:    Creatinine, Ser 1.33 (*)    ALT 11 (*)    All other components within normal limits  URINE CULTURE  CBC WITH DIFFERENTIAL/PLATELET    EKG  EKG Interpretation None       Radiology No results found.  Procedures Procedures (including critical care time)  Medications Ordered in ED Medications  phenazopyridine (PYRIDIUM) tablet 100 mg (not administered)  sulfamethoxazole-trimethoprim (BACTRIM DS,SEPTRA DS) 800-160 MG per tablet 1 tablet (not administered)     Initial Impression / Assessment and Plan / ED Course  I have reviewed the triage vital signs and the nursing notes.  Pertinent labs & imaging results that were available during my care of the patient were reviewed by me and considered in my medical decision making (see chart for details).  Clinical Course     Urine culture sent out, rx Bactrim and Pyridium. Pt is very symptomatic but urine is not showing infection. Will send out culture and treat.  Medications  phenazopyridine (PYRIDIUM) tablet 100 mg (not administered)  sulfamethoxazole-trimethoprim (BACTRIM DS,SEPTRA DS) 800-160 MG per tablet 1 tablet (not administered)    I discussed results, diagnoses and plan with [name]. They voice there understanding and questions were answered. We discussed follow-up recommendations and return precautions.   Final Clinical Impressions(s) / ED Diagnoses   Final diagnoses:  Urinary frequency  Dysuria    New Prescriptions New Prescriptions    PHENAZOPYRIDINE (PYRIDIUM) 200 MG TABLET    Take 1 tablet (200 mg total) by mouth 3 (three) times daily.   SULFAMETHOXAZOLE-TRIMETHOPRIM (BACTRIM DS,SEPTRA DS) 800-160 MG TABLET    Take 1 tablet by mouth 2 (two) times daily.     Delos Haring, PA-C 02/19/16 0230    Varney Biles, MD 02/19/16 2221

## 2016-02-20 LAB — URINE CULTURE: Culture: NO GROWTH

## 2016-07-22 ENCOUNTER — Emergency Department (HOSPITAL_COMMUNITY): Payer: Self-pay

## 2016-07-22 ENCOUNTER — Emergency Department (HOSPITAL_COMMUNITY)
Admission: EM | Admit: 2016-07-22 | Discharge: 2016-07-22 | Disposition: A | Discharge status: Home / Self Care | Payer: Self-pay | Attending: Emergency Medicine | Admitting: Emergency Medicine

## 2016-07-22 ENCOUNTER — Encounter (HOSPITAL_COMMUNITY): Payer: Self-pay | Admitting: Emergency Medicine

## 2016-07-22 DIAGNOSIS — B9789 Other viral agents as the cause of diseases classified elsewhere: Secondary | ICD-10-CM

## 2016-07-22 DIAGNOSIS — Q059 Spina bifida, unspecified: Secondary | ICD-10-CM | POA: Insufficient documentation

## 2016-07-22 DIAGNOSIS — F17200 Nicotine dependence, unspecified, uncomplicated: Secondary | ICD-10-CM | POA: Insufficient documentation

## 2016-07-22 DIAGNOSIS — Z91040 Latex allergy status: Secondary | ICD-10-CM | POA: Insufficient documentation

## 2016-07-22 DIAGNOSIS — J069 Acute upper respiratory infection, unspecified: Secondary | ICD-10-CM | POA: Insufficient documentation

## 2016-07-22 DIAGNOSIS — R300 Dysuria: Secondary | ICD-10-CM | POA: Insufficient documentation

## 2016-07-22 LAB — URINALYSIS, ROUTINE W REFLEX MICROSCOPIC
Bilirubin Urine: NEGATIVE
Glucose, UA: NEGATIVE mg/dL
HGB URINE DIPSTICK: NEGATIVE
Ketones, ur: NEGATIVE mg/dL
Nitrite: NEGATIVE
PROTEIN: NEGATIVE mg/dL
Specific Gravity, Urine: 1.02 (ref 1.005–1.030)
pH: 5 (ref 5.0–8.0)

## 2016-07-22 MED ORDER — PHENAZOPYRIDINE HCL 200 MG PO TABS: 200.0000 mg | ORAL_TABLET | Freq: Three times a day (TID) | ORAL | 0 refills | Status: DC

## 2016-07-22 NOTE — ED Triage Notes (Signed)
Pt states "ive had a really bad cough, im also having bladder issues, I have spina bifida, I feel like I have to pee, and I wont go a lot, and within ten minutes I feel like I have to go again.".

## 2016-07-22 NOTE — Discharge Instructions (Signed)
Please read attached information. If you experience any new or worsening signs or symptoms please return to the emergency room for evaluation. Please follow-up with your primary care provider or specialist as discussed. Please use medication prescribed only as directed and discontinue taking if you have any concerning signs or symptoms.   °

## 2016-07-22 NOTE — ED Provider Notes (Signed)
[facility] DEPT Provider Note   CSN: [id number] Arrival date & time: 07/22/16  1208  By signing my name below, I, Riley Simmons, attest that this documentation has been prepared under the direction and in the presence of [employer], PA-C. Electronically Signed: Dora Simmons, Scribe. 07/22/2016. 12:48 PM.  History   Chief Complaint Chief Complaint  Patient presents with  . Cough  . Recurrent UTI   The history is provided by the patient. No language interpreter was used.   HPI Comments:  Riley Simmons is a 21 y.o. male with PMHx including spina bifida, scoliosis, and neurogenic bladder requiring self-catheterization since age 1 who presents to the Emergency Department complaining of persistent urinary frequency and urgency for two days. He reports some associated difficulty urinating, 7/10 sharp bilateral lower back pain, malodorous and dark urine, subjective fevers with episodes of sweats, and nausea without vomiting. He states these symptoms are consistent with prior UTI's and notes that he has had 6-7 UTI's within the last year. Patient reports that he is currently uninsured and has been reusing his foley catheters as he cannot afford new ones. Patient also notes that he regularly rinses his catheters with hot water prior to insertion. He denies dysuria, abdominal pain, chills, or any other associated symptoms.  He is also complaining of a persistent cough productive of yellow-tinted sputum for a few days. He reports associated dyspnea secondary to coughing, nasal congestion, and some chest tightness but no chest pain. He notes that his cough and dyspnea are worse with lying flat. No alleviating factors noted and no medications tried for his cough. Patient's roommate currently has some similar respiratory symptoms but he has no other known sick contacts. He denies rashes, eye discharge, headaches or any other associated symptoms.  Patient reports that he called out of work to come to  the hospital today and needs a physician's note.    Past Medical History:  Diagnosis Date  . Marijuana smoker, continuous   . Scoliosis   . Spina bifida ([place])   . Urethritis     There are no active problems to display for this patient.   History reviewed. No pertinent surgical history.     Home Medications    Prior to Admission medications   Medication Sig Start Date End Date Taking? Authorizing Provider  Catheters (CLEAR ADVANTAGE SILICONE CATH) MISC 1 Device by Does not apply route as needed. 06/05/15   Muthersbaugh, Jarrett Soho, PA-C  Catheters KIT Provide 21 self catheterization kits for PRN catheterization 11/08/13   Forcucci, Courtney, PA-C  cephALEXin (KEFLEX) 500 MG capsule Take 1 capsule (500 mg total) by mouth 4 (four) times daily. Patient not taking: Reported on 11/09/2015 06/05/15   Muthersbaugh, Jarrett Soho, PA-C  famotidine (PEPCID) 20 MG tablet Take 1 tablet (20 mg total) by mouth 2 (two) times daily. Patient not taking: Reported on 11/09/2015 10/01/15   Charlesetta Shanks, MD  HYDROcodone-acetaminophen (NORCO/VICODIN) 5-325 MG tablet Take 1 tablet by mouth every 4 (four) hours as needed. 12/20/15   Larene Pickett, PA-C  ondansetron (ZOFRAN) 4 MG tablet Take 1 tablet (4 mg total) by mouth every 6 (six) hours. Patient not taking: Reported on 11/09/2015 10/01/15   Charlesetta Shanks, MD  oxybutynin (DITROPAN XL) 10 MG 24 hr tablet Take 1 tablet (10 mg total) by mouth at bedtime. Patient not taking: Reported on 11/09/2015 06/05/15   Muthersbaugh, Jarrett Soho, PA-C  oxyCODONE-acetaminophen (PERCOCET/ROXICET) 5-325 MG tablet Take 1-2 tablets by mouth every 6 (six) hours as needed for  severe pain. 11/09/15   Horton, Barbette Hair, MD  phenazopyridine (PYRIDIUM) 200 MG tablet Take 1 tablet (200 mg total) by mouth 3 (three) times daily. 07/22/16   Okey Regal, PA-C    Family History No family history on file.  Social History Social History  Substance Use Topics  . Smoking status: Current Some Day  Smoker  . Smokeless tobacco: Never Used  . Alcohol use Yes     Allergies   Levofloxacin; Amoxicillin; and Latex   Review of Systems Review of Systems  Constitutional: Positive for fever. Negative for chills.  Eyes: Negative for discharge.  Respiratory: Positive for cough, chest tightness and shortness of breath (secondary to cough).   Cardiovascular: Negative for chest pain.  Gastrointestinal: Positive for nausea. Negative for abdominal pain and vomiting.  Genitourinary: Positive for difficulty urinating, frequency and urgency. Negative for dysuria.  Musculoskeletal: Positive for back pain.  Skin: Negative for rash.  Neurological: Negative for headaches.  All other systems reviewed and are negative.   Physical Exam Updated Vital Signs BP (!) 146/112   Pulse 90   Temp 98.2 F (36.8 C) (Oral)   Resp 16   Ht _0  (1.651 m)   Wt 59 kg (130 lb)   SpO2 96%   BMI 21.63 kg/m   Physical Exam  Constitutional: He is oriented to person, place, and time. He appears well-developed and well-nourished. No distress.  Non-toxic appearing.  HENT:  Head: Normocephalic and atraumatic.  Right Ear: Tympanic membrane normal.  Left Ear: Tympanic membrane normal.  Mouth/Throat: Oropharynx is clear and moist. No oropharyngeal exudate or posterior oropharyngeal erythema.  Eyes: Conjunctivae and EOM are normal. Pupils are equal, round, and reactive to light.  Neck: Neck supple. No tracheal deviation present.  Swollen right anterior cervical lymph node.  Cardiovascular: Normal rate, regular rhythm, normal heart sounds and intact distal pulses.   Pulmonary/Chest: Effort normal. No respiratory distress. He has wheezes.  Anterior inspiratory wheezes bilaterally at the apex. No crackles or rales. Posterior lungs are clear bilaterally.   Abdominal: Soft. Bowel sounds are normal. There is tenderness. There is CVA tenderness.  Left CVA tenderness. Mild TTP in RLQ.   Musculoskeletal: Normal range of  motion.  Neurological: He is alert and oriented to person, place, and time.  Skin: Skin is warm and dry.  No skin changes noted other than surgical scars on his back.   Psychiatric: He has a normal mood and affect. His behavior is normal.  Nursing note and vitals reviewed.  ED Treatments / Results  Labs (all labs ordered are listed, but only abnormal results are displayed) Labs Reviewed  URINALYSIS, ROUTINE W REFLEX MICROSCOPIC - Abnormal; Notable for the following:       Result Value   Leukocytes, UA TRACE (*)    Bacteria, UA RARE (*)    Squamous Epithelial / LPF 0-5 (*)    All other components within normal limits  URINE CULTURE    EKG  EKG Interpretation None       Radiology Dg Chest 2 View  Result Date: 07/22/2016 CLINICAL DATA:  Three days of cough, known scoliosis and spina bifida EXAM: CHEST  2 VIEW COMPARISON:  Chest x-ray of December 14, 2007 FINDINGS: Chronic bilateral rib deformities and thoracic dextroscoliosis are stable. The lungs are well-expanded. There is no focal infiltrate. There is no pleural effusion. The heart and pulmonary vascularity are normal. IMPRESSION: There is no pneumonia nor other acute cardiopulmonary abnormality. Electronically Signed   By: Shanon Brow  [place] M.D.   On: 07/22/2016 12:58    Procedures Procedures (including critical care time)  DIAGNOSTIC STUDIES: Oxygen Saturation is 96% on RA, adequate by my interpretation.    COORDINATION OF CARE: 12:45 PM Discussed treatment plan with pt at bedside and pt agreed to plan.  Medications Ordered in ED Medications - No data to display   Initial Impression / Assessment and Plan / ED Course  I have reviewed the triage vital signs and the nursing notes.  Pertinent labs & imaging results that were available during my care of the patient were reviewed by me and considered in my medical decision making (see chart for details).       Final Clinical Impressions(s) / ED Diagnoses   Final  diagnoses:  Dysuria  Viral URI with cough    Labs: Urine culture, urinalysis  Imaging: DG Chest 2 View  Consults:  Therapeutics:  Discharge Meds:   Assessment/Plan: 21 year old male presents today with numerous complaints.  Patient has a cough over the last several days, likely viral in nature.  Afebrile nontoxic clear lung sounds with no acute bacterial infection on plain films.  Symptomatic care instructions given.  Patient also having dysuria over the last several days.  Again no fever.  Patient has a history of frequent visits for urinary symptoms.  Patient has had urinary tract infections in the past, most recently he had symptoms with no growth on his urine culture.  Patient on his presentation I do not feel it is necessary to initiate antibiotics at this time, urine culture will be sent for sensitivity.  Patient given clean caths here for home.  Patient given strict return precautions, he verbalized understanding and agreement to today's plan and had no further questions or concerns.   New Prescriptions Discharge Medication List as of 07/22/2016  1:28 PM          Okey Regal, PA-C 07/22/16 1949    Leonette Monarch Grayce Sessions, MD 07/25/16 [phone]

## 2016-07-24 LAB — URINE CULTURE
Culture: >=100000 — AB
Special Requests: NORMAL

## 2016-07-25 ENCOUNTER — Telehealth: Payer: Self-pay

## 2016-07-25 NOTE — Progress Notes (Signed)
ED Antimicrobial Stewardship Positive Culture Follow Up   Riley Simmons is an 21 y.o. male who [facility] with a chief complaint of  Chief Complaint  Patient presents with  . Cough  . Recurrent UTI    Recent Results (from the past 720 hour(s))  Urine culture     Status: Abnormal   Collection Time: 07/22/16 12:27 PM  Result Value Ref Range Status   Specimen Description URINE, CATHETERIZED  Final   Special Requests Normal  Final   Culture >=100,000 COLONIES/mL ENTEROBACTER CLOACAE (A)  Final   Report Status 07/24/2016 FINAL  Final   Organism ID, Bacteria ENTEROBACTER CLOACAE (A)  Final      Susceptibility   Enterobacter cloacae - MIC*    CEFAZOLIN >=64 RESISTANT Resistant     CEFTRIAXONE <=1 SENSITIVE Sensitive     CIPROFLOXACIN <=0.25 SENSITIVE Sensitive     GENTAMICIN <=1 SENSITIVE Sensitive     IMIPENEM <=0.25 SENSITIVE Sensitive     NITROFURANTOIN 128 RESISTANT Resistant     TRIMETH/SULFA <=20 SENSITIVE Sensitive     PIP/TAZO <=4 SENSITIVE Sensitive     * >=100,000 COLONIES/mL ENTEROBACTER CLOACAE    [x]  Patient discharged originally without antimicrobial agent and treatment is now indicated  New antibiotic prescription: Bactrim 1 DS tablet PO BID x 7 days  ED Provider: Terance HartKelly Gekas, PA   Riley Simmons, Riley Simmons 07/25/2016, 10:17 AM Clinical Pharmacist Phone# [phone]

## 2016-07-25 NOTE — Telephone Encounter (Signed)
Post ED Visit - Positive Culture Follow-up: Successful Patient Follow-Up  Culture assessed and recommendations reviewed by: []  Enzo BiNathan Batchelder, Pharm.D. []  Celedonio MiyamotoJeremy Frens, Pharm.D., BCPS AQ-ID []  Garvin FilaMike Maccia, Pharm.D., BCPS []  Georgina PillionElizabeth Martin, [address]Ph[address]D., BCPS []  SusanvilleMinh Pham, [address]Pharm[address]D., BCPS, AAHIVP []  Estella HuskMichelle Turner, Pharm.D., BCPS, AAHIVP []  Lysle Pearlachel Rumbarger, PharmD, BCPS []  Casilda Carlsaylor Stone, PharmD, BCPS []  Pollyann SamplesAndy Johnston, PharmD, BCPS Babs BertinHaley Baird Pharm D Positive urine culture  [x]  Patient discharged without antimicrobial prescription and treatment is now indicated []  Organism is resistant to prescribed ED discharge antimicrobial []  Patient with positive blood cultures  Changes discussed with ED provider: Terance HartKelly Gekas Pa-c New antibiotic prescription Bactrim DS 1 po BID x 7 days [phone]  Contacted patient, date 07/25/16, time 1131   Riley Simmons 07/25/2016, 11:30 AM

## 2016-07-31 ENCOUNTER — Encounter (HOSPITAL_COMMUNITY): Payer: Self-pay

## 2016-07-31 ENCOUNTER — Emergency Department (HOSPITAL_COMMUNITY)
Admission: EM | Admit: 2016-07-31 | Discharge: 2016-07-31 | Disposition: A | Discharge status: Home / Self Care | Payer: Self-pay | Attending: Emergency Medicine | Admitting: Emergency Medicine

## 2016-07-31 DIAGNOSIS — Z760 Encounter for issue of repeat prescription: Secondary | ICD-10-CM

## 2016-07-31 DIAGNOSIS — F17200 Nicotine dependence, unspecified, uncomplicated: Secondary | ICD-10-CM | POA: Insufficient documentation

## 2016-07-31 DIAGNOSIS — Z79899 Other long term (current) drug therapy: Secondary | ICD-10-CM | POA: Insufficient documentation

## 2016-07-31 DIAGNOSIS — Q059 Spina bifida, unspecified: Secondary | ICD-10-CM | POA: Insufficient documentation

## 2016-07-31 MED ORDER — CATHETERS KIT: PACK | 0 refills | Status: DC

## 2016-07-31 NOTE — ED Provider Notes (Signed)
Riley Simmons Simmons DEPT Provider Note   CSN: [id number] Arrival date & time: 07/31/16  1103  By signing my name below, I, Riley Simmons Simmons, attest that this documentation has been prepared under the direction and in the presence of Riley [employer], PA-C.  Electronically Signed: Reola Simmons, ED Scribe. 07/31/16. 11:57 AM.  History   Chief Complaint Chief Complaint  Patient presents with  . Medication Refill   The history is provided by the patient. No language interpreter was used.    HPI Comments: Riley Simmons Simmons is a 21 y.o. male with a PMHx of Spina Bifida, Urethritis, and Scoliosis, who presents to the Emergency Department requesting a prescription for his self-catheter kits. Pt has a h/o Spina bifida and reports that he uses Coloplast 12-French self-catheters to self-cath daily. He reports that he recently ran out of these and is requesting a refill. Pt notes that he typically receives his refill from [facility]. No fevers, dysuria. No other associated symptoms or complaints at this time.   Past Medical History:  Diagnosis Date  . Marijuana smoker, continuous   . Scoliosis   . Spina bifida (Riley Simmons Simmons)   . Urethritis    There are no active problems to display for this patient.  History reviewed. No pertinent surgical history.  Home Medications    Prior to Admission medications   Medication Sig Start Date End Date Taking? Authorizing Provider  Catheters KIT Coloplast 12 french Provide 30 self catheterization kits for PRN catheterization 07/31/16   Riley Simmons Simmons, Riley Simmons Ricks, PA-C  cephALEXin (KEFLEX) 500 MG capsule Take 1 capsule (500 mg total) by mouth 4 (four) times daily. Patient not taking: Reported on 11/09/2015 06/05/15   Riley Simmons Simmons, Riley Simmons Soho, PA-C  famotidine (PEPCID) 20 MG tablet Take 1 tablet (20 mg total) by mouth 2 (two) times daily. Patient not taking: Reported on 11/09/2015 10/01/15   Riley Simmons Shanks, Riley Simmons  HYDROcodone-acetaminophen (NORCO/VICODIN) 5-325 MG  tablet Take 1 tablet by mouth every 4 (four) hours as needed. 12/20/15   Riley Simmons Pickett, PA-C  ondansetron (ZOFRAN) 4 MG tablet Take 1 tablet (4 mg total) by mouth every 6 (six) hours. Patient not taking: Reported on 11/09/2015 10/01/15   Riley Simmons Shanks, Riley Simmons  oxybutynin (DITROPAN XL) 10 MG 24 hr tablet Take 1 tablet (10 mg total) by mouth at bedtime. Patient not taking: Reported on 11/09/2015 06/05/15   Riley Simmons Simmons, Riley Simmons Soho, PA-C  oxyCODONE-acetaminophen (PERCOCET/ROXICET) 5-325 MG tablet Take 1-2 tablets by mouth every 6 (six) hours as needed for severe pain. 11/09/15   Riley Simmons Simmons, Riley Simmons Hair, Riley Simmons  phenazopyridine (PYRIDIUM) 200 MG tablet Take 1 tablet (200 mg total) by mouth 3 (three) times daily. 07/22/16   Riley Regal, PA-C   Family History No family history on file.  Social History Social History  Substance Use Topics  . Smoking status: Current Some Day Smoker  . Smokeless tobacco: Never Used  . Alcohol use Yes   Allergies   Levofloxacin; Amoxicillin; and Latex  Review of Systems Review of Systems   A complete review of systems was obtained and all systems are negative except as noted in the HPI and PMH.   Physical Exam Updated Vital Signs BP 131/83 (BP Location: Right Arm)   Pulse 65   Temp 99 F (37.2 C) (Oral)   Resp 18   SpO2 98%   Physical Exam  Constitutional: He appears well-developed and well-nourished. No distress.  HENT:  Head: Normocephalic and atraumatic.  Eyes: Conjunctivae are normal.  Neck: Normal range of motion.  Cardiovascular:  Normal rate.   Pulmonary/Chest: Effort normal.  Abdominal: He exhibits no distension.  Musculoskeletal: Normal range of motion.  Neurological: He is alert.  Skin: No pallor.  Psychiatric: He has a normal mood and affect. His behavior is normal.  Nursing note and vitals reviewed.  ED Treatments / Results  DIAGNOSTIC STUDIES: Oxygen Saturation is 97% on RA, normal by my interpretation.   COORDINATION OF CARE: 11:53  AM-Discussed next steps with pt. Pt verbalized understanding and is agreeable with the plan.   Labs (all labs ordered are listed, but only abnormal results are displayed) Labs Reviewed - No data to display  EKG  EKG Interpretation None      Radiology No results found.  Procedures Procedures   Medications Ordered in ED Medications - No data to display  Initial Impression / Assessment and Plan / ED Course  I have reviewed the triage vital signs and the nursing notes.  Pertinent labs & imaging results that were available during my care of the patient were reviewed by me and considered in my medical decision making (see chart for details).    Vitals:   07/31/16 1114 07/31/16 1216  BP: (!) 142/97 131/83  Pulse: 87 65  Resp: 16 18  Temp: 99 F (37.2 C)   TempSrc: Oral   SpO2: 97% 98%    Medications - No data to display  Riley Simmons Simmons is 21 y.o. male requesting refill on self caths, no other symptoms. I offered to give him a referral to primary care but he said he wouldn't go.  Evaluation does not show pathology that would require ongoing emergent intervention or inpatient treatment. Pt is hemodynamically stable and mentating appropriately. Discussed findings and plan with patient/guardian, who agrees with care plan. All questions answered. Return precautions discussed and outpatient follow up given.      Final Clinical Impressions(s) / ED Diagnoses   Final diagnoses:  Medication refill   New Prescriptions Discharge Medication List as of 07/31/2016 12:15 PM     I personally performed the services described in this documentation, which was scribed in my presence. The recorded information has been reviewed and is accurate.     Riley Simmons Simmons 07/31/16 1252    Riley Simmons Sorrow, Riley Simmons 08/12/16 [phone]

## 2016-07-31 NOTE — Discharge Instructions (Signed)
Do not hesitate to return to the Emergency Department for any new, worsening or concerning symptoms.  ° °If you do not have a primary care doctor you can establish one at the  ° °[facility]: °[address] °[place] [address] °[phone] ° °After you establish care. Let them know you were seen in the emergency room. They must obtain records for further management.  ° ° °

## 2016-07-31 NOTE — ED Triage Notes (Signed)
Pt reports he has Spina Bifida and is requesting a prescription for self catheter kits.

## 2016-11-09 ENCOUNTER — Emergency Department (HOSPITAL_COMMUNITY)
Admission: EM | Admit: 2016-11-09 | Discharge: 2016-11-09 | Disposition: A | Discharge status: Home / Self Care | Payer: Self-pay | Attending: Emergency Medicine | Admitting: Emergency Medicine

## 2016-11-09 ENCOUNTER — Encounter (HOSPITAL_COMMUNITY): Payer: Self-pay | Admitting: Emergency Medicine

## 2016-11-09 DIAGNOSIS — F17210 Nicotine dependence, cigarettes, uncomplicated: Secondary | ICD-10-CM | POA: Insufficient documentation

## 2016-11-09 DIAGNOSIS — Z87798 Personal history of other (corrected) congenital malformations: Secondary | ICD-10-CM | POA: Insufficient documentation

## 2016-11-09 DIAGNOSIS — R1030 Lower abdominal pain, unspecified: Secondary | ICD-10-CM | POA: Insufficient documentation

## 2016-11-09 DIAGNOSIS — Z91040 Latex allergy status: Secondary | ICD-10-CM | POA: Insufficient documentation

## 2016-11-09 DIAGNOSIS — N3000 Acute cystitis without hematuria: Secondary | ICD-10-CM | POA: Insufficient documentation

## 2016-11-09 DIAGNOSIS — Z79899 Other long term (current) drug therapy: Secondary | ICD-10-CM | POA: Insufficient documentation

## 2016-11-09 LAB — COMPREHENSIVE METABOLIC PANEL
ALT: 12 U/L — ABNORMAL LOW (ref 17–63)
AST: 16 U/L (ref 15–41)
Albumin: 4.1 g/dL (ref 3.5–5.0)
Alkaline Phosphatase: 69 U/L (ref 38–126)
Anion gap: 7 (ref 5–15)
BUN: 14 mg/dL (ref 6–20)
CO2: 26 mmol/L (ref 22–32)
Calcium: 8.5 mg/dL — ABNORMAL LOW (ref 8.9–10.3)
Chloride: 105 mmol/L (ref 101–111)
Creatinine, Ser: 1.14 mg/dL (ref 0.61–1.24)
GFR calc Af Amer: >60 mL/min (ref >60)
GFR calc non Af Amer: >60 mL/min (ref >60)
Glucose, Bld: 108 mg/dL — ABNORMAL HIGH (ref 65–99)
Potassium: 3.8 mmol/L (ref 3.5–5.1)
Sodium: 138 mmol/L (ref 135–145)
Total Bilirubin: 0.6 mg/dL (ref 0.3–1.2)
Total Protein: 6.6 g/dL (ref 6.5–8.1)

## 2016-11-09 LAB — CBC WITH DIFFERENTIAL/PLATELET
Basophils Absolute: 0.1 10*3/uL (ref 0.0–0.1)
Basophils Relative: 1 %
Eosinophils Absolute: 0.3 10*3/uL (ref 0.0–0.7)
Eosinophils Relative: 3 %
HCT: 46.2 % (ref 39.0–52.0)
Hemoglobin: 15.5 g/dL (ref 13.0–17.0)
Lymphocytes Relative: 31 %
Lymphs Abs: 3.2 10*3/uL (ref 0.7–4.0)
MCH: 30.5 pg (ref 26.0–34.0)
MCHC: 33.5 g/dL (ref 30.0–36.0)
MCV: 90.9 fL (ref 78.0–100.0)
Monocytes Absolute: 1 10*3/uL (ref 0.1–1.0)
Monocytes Relative: 10 %
Neutro Abs: 5.7 10*3/uL (ref 1.7–7.7)
Neutrophils Relative %: 55 %
Platelets: 235 10*3/uL (ref 150–400)
RBC: 5.08 MIL/uL (ref 4.22–5.81)
RDW: 12.9 % (ref 11.5–15.5)
WBC: 10.2 10*3/uL (ref 4.0–10.5)

## 2016-11-09 LAB — URINALYSIS, ROUTINE W REFLEX MICROSCOPIC
Bilirubin Urine: NEGATIVE
Glucose, UA: NEGATIVE mg/dL
Ketones, ur: NEGATIVE mg/dL
Nitrite: NEGATIVE
Protein, ur: 100 mg/dL — AB
Specific Gravity, Urine: 1.02 (ref 1.005–1.030)
pH: 5 (ref 5.0–8.0)

## 2016-11-09 MED ORDER — SULFAMETHOXAZOLE-TRIMETHOPRIM 800-160 MG PO TABS: 1.0000 | ORAL_TABLET | Freq: Two times a day (BID) | ORAL | 0 refills | Status: AC

## 2016-11-09 MED ORDER — SULFAMETHOXAZOLE-TRIMETHOPRIM 800-160 MG PO TABS
1.0000 | ORAL_TABLET | Freq: Once | ORAL | Status: AC
  Administered 2016-11-09: 1 via ORAL
  Filled 2016-11-09: qty 1

## 2016-11-09 MED ORDER — PHENAZOPYRIDINE HCL 100 MG PO TABS
100.0000 mg | ORAL_TABLET | Freq: Once | ORAL | Status: AC
  Administered 2016-11-09: 100 mg via ORAL
  Filled 2016-11-09: qty 1

## 2016-11-09 MED ORDER — PHENAZOPYRIDINE HCL 95 MG PO TABS: 95.0000 mg | ORAL_TABLET | Freq: Three times a day (TID) | ORAL | 0 refills | Status: DC | PRN

## 2016-11-09 NOTE — ED Triage Notes (Signed)
Patient has spina bifida, self caths with non sterile caths and has recurrent UTIs.  He is having the feeling of having to cath himself every hour and only getting very little out when he caths.  He has had fevers on and off.

## 2016-11-09 NOTE — ED Notes (Signed)
Notified lab for add on urine culture

## 2016-11-09 NOTE — ED Provider Notes (Signed)
[facility] DEPT Provider Note   CSN: [id number] Arrival date & time: 11/09/16  2263     History   Chief Complaint Chief Complaint  Patient presents with  . Recurrent UTI    HPI Riley Simmons is a 21 y.o. male.  HPI   21 year old male with urinary urgency and mild suprapubic discomfort. He has a history of spina bifida. He straight caths himself regularly. Because of financial constraints he reduces his catheters. Began having symptoms in the past 2 days. Worsen last night. Subjective fever. No nausea or vomiting. No other acute complaints.  Past Medical History:  Diagnosis Date  . Marijuana smoker, continuous   . Scoliosis   . Spina bifida ([place])   . Urethritis     There are no active problems to display for this patient.   History reviewed. No pertinent surgical history.     Home Medications    Prior to Admission medications   Medication Sig Start Date End Date Taking? Authorizing Provider  Catheters KIT Coloplast 12 french Provide 30 self catheterization kits for PRN catheterization 07/31/16   Pisciotta, Elmyra Ricks, PA-C  HYDROcodone-acetaminophen (NORCO/VICODIN) 5-325 MG tablet Take 1 tablet by mouth every 4 (four) hours as needed. 12/20/15   Larene Pickett, PA-C  oxybutynin (DITROPAN XL) 10 MG 24 hr tablet Take 1 tablet (10 mg total) by mouth at bedtime. Patient not taking: Reported on 11/09/2015 06/05/15   Muthersbaugh, Jarrett Soho, PA-C  oxyCODONE-acetaminophen (PERCOCET/ROXICET) 5-325 MG tablet Take 1-2 tablets by mouth every 6 (six) hours as needed for severe pain. 11/09/15   Horton, Barbette Hair, MD  phenazopyridine (PYRIDIUM) 200 MG tablet Take 1 tablet (200 mg total) by mouth 3 (three) times daily. 07/22/16   Hedges, Dellis Filbert, PA-C  phenazopyridine (PYRIDIUM) 95 MG tablet Take 1 tablet (95 mg total) by mouth 3 (three) times daily as needed for pain. 11/09/16   Virgel Manifold, MD  sulfamethoxazole-trimethoprim (BACTRIM DS,SEPTRA DS) 800-160 MG tablet Take 1 tablet by  mouth 2 (two) times daily. 11/09/16 11/16/16  Virgel Manifold, MD    Family History No family history on file.  Social History Social History  Substance Use Topics  . Smoking status: Current Some Day Smoker  . Smokeless tobacco: Never Used  . Alcohol use Yes     Allergies   Levofloxacin; Amoxicillin; and Latex   Review of Systems Review of Systems   Physical Exam Updated Vital Signs BP 113/70 (BP Location: Left Arm)   Pulse 66   Temp (!) 97.5 F (36.4 C) (Oral)   Resp 18   SpO2 99%   Physical Exam  Constitutional: He appears well-developed and well-nourished. No distress.  HENT:  Head: Normocephalic and atraumatic.  Eyes: Conjunctivae are normal. Right eye exhibits no discharge. Left eye exhibits no discharge.  Neck: Neck supple.  Cardiovascular: Normal rate, regular rhythm and normal heart sounds.  Exam reveals no gallop and no friction rub.   No murmur heard. Pulmonary/Chest: Effort normal and breath sounds normal. No respiratory distress.  Abdominal: Soft. He exhibits no distension. There is tenderness.  Very mild suprapubic tenderness without rebound or guarding. No distention.  Musculoskeletal: He exhibits no edema or tenderness.  Neurological: He is alert.  Skin: Skin is warm and dry.  Psychiatric: He has a normal mood and affect. His behavior is normal. Thought content normal.  Nursing note and vitals reviewed.    ED Treatments / Results  Labs (all labs ordered are listed, but only abnormal results are displayed) Labs Reviewed  URINE CULTURE - Abnormal; Notable for the following:       Result Value   Culture >=100,000 COLONIES/mL GRAM NEGATIVE RODS (*)    All other components within normal limits  URINALYSIS, ROUTINE W REFLEX MICROSCOPIC - Abnormal; Notable for the following:    APPearance HAZY (*)    Hgb urine dipstick MODERATE (*)    Protein, ur 100 (*)    Leukocytes, UA TRACE (*)    Bacteria, UA FEW (*)    Squamous Epithelial / LPF 0-5 (*)     All other components within normal limits  COMPREHENSIVE METABOLIC PANEL - Abnormal; Notable for the following:    Glucose, Bld 108 (*)    Calcium 8.5 (*)    ALT 12 (*)    All other components within normal limits  CBC WITH DIFFERENTIAL/PLATELET    EKG  EKG Interpretation None       Radiology No results found.  Procedures Procedures (including critical care time)  Medications Ordered in ED Medications  phenazopyridine (PYRIDIUM) tablet 100 mg (not administered)  sulfamethoxazole-trimethoprim (BACTRIM DS,SEPTRA DS) 800-160 MG per tablet 1 tablet (not administered)     Initial Impression / Assessment and Plan / ED Course  I have reviewed the triage vital signs and the nursing notes.  Pertinent labs & imaging results that were available during my care of the patient were reviewed by me and considered in my medical decision making (see chart for details).     Symptoms of UTI. UA supportive. Straight caths and reuses because of financial reasons. Afebrile. Reassuring exam. Outpt tx. Urine culture.   Final Clinical Impressions(s) / ED Diagnoses   Final diagnoses:  Acute cystitis without hematuria    New Prescriptions New Prescriptions   PHENAZOPYRIDINE (PYRIDIUM) 95 MG TABLET    Take 1 tablet (95 mg total) by mouth 3 (three) times daily as needed for pain.   SULFAMETHOXAZOLE-TRIMETHOPRIM (BACTRIM DS,SEPTRA DS) 800-160 MG TABLET    Take 1 tablet by mouth 2 (two) times daily.     Virgel Manifold, MD 11/10/16 1409

## 2016-11-09 NOTE — ED Notes (Signed)
Pt states he is in and out cathing every 10  Minutes.  Does not have money to get new supplies and is reusing.  No blood.  Pt states some lower abdominal pain.  No back pain.

## 2016-11-11 LAB — URINE CULTURE: Culture: >=100000 — AB

## 2016-11-12 ENCOUNTER — Telehealth: Payer: Self-pay | Admitting: *Deleted

## 2016-11-12 NOTE — Telephone Encounter (Signed)
Post ED Visit - Positive Culture Follow-up  Culture report reviewed by antimicrobial stewardship pharmacist:   Enzo Bi, Pharm.D.  Celedonio Miyamoto, Pharm.D., BCPS AQ-ID  Garvin Fila, Pharm.D., BCPS  Georgina Pillion, Pharm.D., BCPS  [place], [address].D., BCPS, AAHIVP  Estella Husk, Pharm.D., BCPS, AAHIVP  Lysle Pearl, PharmD, BCPS  Casilda Carls, PharmD, BCPS  Pollyann Samples, PharmD, BCPS  Positive urine culture Treated with Sulfamethoxazole-Trimethoprim, organism sensitive to the same and no further patient follow-up is required at this time.  [facility] 11/12/2016, 12:39 PM

## 2017-05-10 ENCOUNTER — Encounter (HOSPITAL_COMMUNITY): Payer: Self-pay | Admitting: Emergency Medicine

## 2017-05-10 ENCOUNTER — Other Ambulatory Visit: Payer: Self-pay

## 2017-05-10 DIAGNOSIS — Z91040 Latex allergy status: Secondary | ICD-10-CM | POA: Insufficient documentation

## 2017-05-10 DIAGNOSIS — F17210 Nicotine dependence, cigarettes, uncomplicated: Secondary | ICD-10-CM | POA: Insufficient documentation

## 2017-05-10 DIAGNOSIS — N10 Acute tubulo-interstitial nephritis: Secondary | ICD-10-CM | POA: Insufficient documentation

## 2017-05-10 LAB — COMPREHENSIVE METABOLIC PANEL
ALT: 13 U/L — ABNORMAL LOW (ref 17–63)
ANION GAP: 11 (ref 5–15)
AST: 19 U/L (ref 15–41)
Albumin: 4.8 g/dL (ref 3.5–5.0)
Alkaline Phosphatase: 78 U/L (ref 38–126)
BUN: 12 mg/dL (ref 6–20)
CO2: 24 mmol/L (ref 22–32)
Calcium: 9.1 mg/dL (ref 8.9–10.3)
Chloride: 103 mmol/L (ref 101–111)
Creatinine, Ser: 1.25 mg/dL — ABNORMAL HIGH (ref 0.61–1.24)
GFR calc non Af Amer: >60 mL/min (ref >60)
Glucose, Bld: 84 mg/dL (ref 65–99)
POTASSIUM: 3.9 mmol/L (ref 3.5–5.1)
SODIUM: 138 mmol/L (ref 135–145)
Total Bilirubin: 0.6 mg/dL (ref 0.3–1.2)
Total Protein: 8.2 g/dL — ABNORMAL HIGH (ref 6.5–8.1)

## 2017-05-10 LAB — CBC WITH DIFFERENTIAL/PLATELET
BASOS ABS: 0 10*3/uL (ref 0.0–0.1)
BASOS PCT: 0 %
Eosinophils Absolute: 0.2 10*3/uL (ref 0.0–0.7)
Eosinophils Relative: 1 %
HEMATOCRIT: 45.7 % (ref 39.0–52.0)
HEMOGLOBIN: 15.5 g/dL (ref 13.0–17.0)
LYMPHS PCT: 16 %
Lymphs Abs: 2.5 10*3/uL (ref 0.7–4.0)
MCH: 31.1 pg (ref 26.0–34.0)
MCHC: 33.9 g/dL (ref 30.0–36.0)
MCV: 91.6 fL (ref 78.0–100.0)
MONOS PCT: 9 %
Monocytes Absolute: 1.4 10*3/uL — ABNORMAL HIGH (ref 0.1–1.0)
NEUTROS ABS: 11.8 10*3/uL — AB (ref 1.7–7.7)
NEUTROS PCT: 74 %
Platelets: 264 10*3/uL (ref 150–400)
RBC: 4.99 MIL/uL (ref 4.22–5.81)
RDW: 13.5 % (ref 11.5–15.5)
WBC: 15.8 10*3/uL — ABNORMAL HIGH (ref 4.0–10.5)

## 2017-05-10 LAB — URINALYSIS, ROUTINE W REFLEX MICROSCOPIC
Glucose, UA: NEGATIVE mg/dL
Ketones, ur: NEGATIVE mg/dL
Nitrite: POSITIVE — AB
PROTEIN: 100 mg/dL — AB
SPECIFIC GRAVITY, URINE: 1.028 (ref 1.005–1.030)
pH: 5 (ref 5.0–8.0)

## 2017-05-10 LAB — I-STAT CG4 LACTIC ACID, ED: LACTIC ACID, VENOUS: 1.32 mmol/L (ref 0.5–1.9)

## 2017-05-10 MED ORDER — OXYCODONE-ACETAMINOPHEN 5-325 MG PO TABS
1.0000 | ORAL_TABLET | ORAL | Status: AC | PRN
  Administered 2017-05-10 (×2): 1 via ORAL
  Filled 2017-05-10 (×2): qty 1

## 2017-05-10 NOTE — ED Triage Notes (Signed)
Pt presents with known kidney infection on L side; [facility] for medications and then patient could not afford them; pt self-caths

## 2017-05-11 ENCOUNTER — Emergency Department (HOSPITAL_COMMUNITY)
Admission: EM | Admit: 2017-05-11 | Discharge: 2017-05-11 | Disposition: A | Discharge status: Home / Self Care | Payer: Self-pay | Attending: Emergency Medicine | Admitting: Emergency Medicine

## 2017-05-11 ENCOUNTER — Emergency Department (HOSPITAL_COMMUNITY): Payer: Self-pay

## 2017-05-11 DIAGNOSIS — N12 Tubulo-interstitial nephritis, not specified as acute or chronic: Secondary | ICD-10-CM

## 2017-05-11 MED ORDER — OXYCODONE-ACETAMINOPHEN 5-325 MG PO TABS
1.0000 | ORAL_TABLET | Freq: Once | ORAL | Status: AC
  Administered 2017-05-11: 1 via ORAL
  Filled 2017-05-11: qty 1

## 2017-05-11 MED ORDER — ONDANSETRON 4 MG PO TBDP: 4.0000 mg | ORAL_TABLET | Freq: Three times a day (TID) | ORAL | 0 refills | Status: DC | PRN

## 2017-05-11 MED ORDER — KETOROLAC TROMETHAMINE 15 MG/ML IJ SOLN
15.0000 mg | Freq: Once | INTRAMUSCULAR | Status: AC
  Administered 2017-05-11: 15 mg via INTRAVENOUS
  Filled 2017-05-11: qty 1

## 2017-05-11 MED ORDER — IOPAMIDOL (ISOVUE-300) INJECTION 61%
INTRAVENOUS | Status: AC
  Administered 2017-05-11: 100 mL
  Filled 2017-05-11: qty 100

## 2017-05-11 MED ORDER — CEPHALEXIN 500 MG PO CAPS: 500.0000 mg | ORAL_CAPSULE | Freq: Three times a day (TID) | ORAL | 0 refills | Status: DC

## 2017-05-11 MED ORDER — SODIUM CHLORIDE 0.9 % IV BOLUS
1000.0000 mL | Freq: Once | INTRAVENOUS | Status: AC
  Administered 2017-05-11: 1000 mL via INTRAVENOUS

## 2017-05-11 MED ORDER — SODIUM CHLORIDE 0.9 % IV SOLN
1.0000 g | Freq: Once | INTRAVENOUS | Status: AC
  Administered 2017-05-11: 1 g via INTRAVENOUS
  Filled 2017-05-11: qty 10

## 2017-05-11 MED ORDER — HYDROCODONE-ACETAMINOPHEN 5-325 MG PO TABS: 1.0000 | ORAL_TABLET | Freq: Four times a day (QID) | ORAL | 0 refills | Status: DC | PRN

## 2017-05-11 NOTE — ED Notes (Signed)
Patient transported to CT 

## 2017-05-11 NOTE — ED Provider Notes (Signed)
[facility] EMERGENCY DEPARTMENT Provider Note   CSN: [id number] Arrival date & time: 05/10/17  1839     History   Chief Complaint Chief Complaint  Patient presents with  . Flank Pain    HPI Riley Simmons is a 22 y.o. male.  HPI  This is a 22 year old male with a history of spina bifida requiring frequent in and out catheterization who presents with concerns for urinary tract infection.  Patient reports that he was seen and evaluated at [facility] proximally 2 weeks ago.  At that time he was told he had a urinary tract infection.  He was unable initially to afford antibiotics.  However, he reports over the last 5 days he has been taking antibiotics.  He thinks it is Bactrim.  However, he has had persistent worsening left flank pain.  Currently rates his pain 8 out of 10.  He has not taken anything for the pain.  He reports that recently he has had difficulty getting his catheter supplies and that he often will get a urinary tract infection when he does not have his catheters for in and out caths.  Denies any recent fevers.  Denies nausea or vomiting.  Past Medical History:  Diagnosis Date  . Marijuana smoker, continuous   . Scoliosis   . Spina bifida ([place])   . Urethritis     There are no active problems to display for this patient.   History reviewed. No pertinent surgical history.      Home Medications    Prior to Admission medications   Medication Sig Start Date End Date Taking? Authorizing Provider  Catheters KIT Coloplast 12 french Provide 30 self catheterization kits for PRN catheterization 07/31/16   Pisciotta, Elmyra Ricks, PA-C  cephALEXin (KEFLEX) 500 MG capsule Take 1 capsule (500 mg total) by mouth 3 (three) times daily. 05/11/17   Cabot Cromartie, Barbette Hair, MD  HYDROcodone-acetaminophen (NORCO/VICODIN) 5-325 MG tablet Take 1 tablet by mouth every 6 (six) hours as needed. 05/11/17   Kion Huntsberry, Barbette Hair, MD  oxybutynin (DITROPAN XL) 10 MG 24 hr  tablet Take 1 tablet (10 mg total) by mouth at bedtime. Patient not taking: Reported on 11/09/2015 06/05/15   Muthersbaugh, Jarrett Soho, PA-C  oxyCODONE-acetaminophen (PERCOCET/ROXICET) 5-325 MG tablet Take 1-2 tablets by mouth every 6 (six) hours as needed for severe pain. Patient not taking: Reported on 05/11/2017 11/09/15   Nabil Bubolz, Barbette Hair, MD  phenazopyridine (PYRIDIUM) 200 MG tablet Take 1 tablet (200 mg total) by mouth 3 (three) times daily. Patient not taking: Reported on 05/11/2017 07/22/16   Hedges, Dellis Filbert, PA-C  phenazopyridine (PYRIDIUM) 95 MG tablet Take 1 tablet (95 mg total) by mouth 3 (three) times daily as needed for pain. Patient not taking: Reported on 05/11/2017 11/09/16   Virgel Manifold, MD  dicyclomine (BENTYL) 20 MG tablet Take 1 tablet (20 mg total) by mouth 2 (two) times daily. Patient not taking: Reported on 02/18/2015 02/03/15 02/18/15  Lorayne Bender, PA-C    Family History History reviewed. No pertinent family history.  Social History Social History   Tobacco Use  . Smoking status: Current Some Day Smoker  . Smokeless tobacco: Never Used  Substance Use Topics  . Alcohol use: Yes  . Drug use: Yes    Types: Marijuana     Allergies   Levofloxacin; Amoxicillin; and Latex   Review of Systems Review of Systems  Constitutional: Negative for fever.  Respiratory: Negative for shortness of breath.   Cardiovascular: Negative for chest  pain.  Gastrointestinal: Negative for abdominal pain, nausea and vomiting.  Genitourinary: Positive for difficulty urinating and flank pain.  All other systems reviewed and are negative.    Physical Exam Updated Vital Signs BP 117/77   Pulse 71   Temp 98.2 F (36.8 C) (Oral)   Resp 16   Ht 5' 4" (1.626 m)   Wt 63.5 kg (140 lb)   SpO2 98%   BMI 24.03 kg/m   Physical Exam  Constitutional: He is oriented to person, place, and time. He appears well-developed and well-nourished. No distress.  HENT:  Head: Normocephalic and  atraumatic.  Cardiovascular: Normal rate, regular rhythm and normal heart sounds.  No murmur heard. Pulmonary/Chest: Effort normal and breath sounds normal. No respiratory distress. He has no wheezes.  Abdominal: Soft. Bowel sounds are normal. There is no tenderness. There is no rebound.  Genitourinary:  Genitourinary Comments: Significant CVA tenderness  Musculoskeletal: He exhibits no edema.  Multiple scars noted over the lower lumbar spine  Neurological: He is alert and oriented to person, place, and time.  Skin: Skin is warm and dry.  Psychiatric: He has a normal mood and affect.  Nursing note and vitals reviewed.    ED Treatments / Results  Labs (all labs ordered are listed, but only abnormal results are displayed) Labs Reviewed  COMPREHENSIVE METABOLIC PANEL - Abnormal; Notable for the following components:      Result Value   Creatinine, Ser 1.25 (*)    Total Protein 8.2 (*)    ALT 13 (*)    All other components within normal limits  CBC WITH DIFFERENTIAL/PLATELET - Abnormal; Notable for the following components:   WBC 15.8 (*)    Neutro Abs 11.8 (*)    Monocytes Absolute 1.4 (*)    All other components within normal limits  URINALYSIS, ROUTINE W REFLEX MICROSCOPIC - Abnormal; Notable for the following components:   Color, Urine AMBER (*)    APPearance HAZY (*)    Hgb urine dipstick MODERATE (*)    Bilirubin Urine SMALL (*)    Protein, ur 100 (*)    Nitrite POSITIVE (*)    Leukocytes, UA MODERATE (*)    Bacteria, UA RARE (*)    Squamous Epithelial / LPF 6-30 (*)    All other components within normal limits  CULTURE, BLOOD (ROUTINE X 2)  CULTURE, BLOOD (ROUTINE X 2)  URINE CULTURE  I-STAT CG4 LACTIC ACID, ED    EKG None  Radiology Ct Abdomen Pelvis W Contrast  Result Date: 05/11/2017 CLINICAL DATA:  21-year-old male with left-sided pyelonephritis. EXAM: CT ABDOMEN AND PELVIS WITH CONTRAST TECHNIQUE: Multidetector CT imaging of the abdomen and pelvis was  performed using the standard protocol following bolus administration of intravenous contrast. CONTRAST:  100mL ISOVUE-300 IOPAMIDOL (ISOVUE-300) INJECTION 61% COMPARISON:  CT of the abdomen pelvis dated 06/05/2015 FINDINGS: Lower chest: The visualized lung bases are clear. No intra-abdominal free air or free fluid. Hepatobiliary: No focal liver abnormality is seen. No gallstones, gallbladder wall thickening, or biliary dilatation. Pancreas: Unremarkable. No pancreatic ductal dilatation or surrounding inflammatory changes. Spleen: Normal in size without focal abnormality. Adrenals/Urinary Tract: The adrenal glands are unremarkable. There is heterogeneous enhancement of the left kidney with areas of decreased enhancement consistent with pyelonephritis. There is moderate left renal atrophy with areas of scarring. There is mild left pelvicaliectasis with urothelial thickening and stranding of the periureteric fat. There is no drainable fluid collection or abscess. The right kidney is unremarkable. Diffusely thickened bladder wall   likely related to cystitis. Clinical correlation is recommended. Stomach/Bowel: Moderate stool throughout the colon. No bowel obstruction or active inflammation. The appendix is not visualized with certainty. No inflammatory changes identified in the right lower quadrant. Vascular/Lymphatic: The abdominal aorta and IVC appear unremarkable. Apparent nonocclusive areas of vascular hypoenhancement involving the right femoral and external iliac vein most likely artifactual and related to mixing artifact. DVT is less likely. Clinical correlation is recommended. Duplex ultrasound may provide better evaluation if there is high clinical concern for DVT. Reproductive: The prostate and seminal vesicles are grossly unremarkable. Other: None Musculoskeletal: No acute or significant osseous findings. IMPRESSION: Findings most consistent with left-sided pyelonephritis. Correlation with clinical exam and  urinalysis recommended. No drainable fluid collection or abscess. Moderate left renal atrophy and areas of parenchymal scarring sequela of chronic infection. Electronically Signed   By: Anner Crete M.D.   On: 05/11/2017 06:35    Procedures Procedures (including critical care time)  Medications Ordered in ED Medications  oxyCODONE-acetaminophen (PERCOCET/ROXICET) 5-325 MG per tablet 1 tablet (1 tablet Oral Given 05/10/17 2240)  cefTRIAXone (ROCEPHIN) 1 g in sodium chloride 0.9 % 100 mL IVPB (0 g Intravenous Stopped 05/11/17 0518)  ketorolac (TORADOL) 15 MG/ML injection 15 mg (15 mg Intravenous Given 05/11/17 0434)  oxyCODONE-acetaminophen (PERCOCET/ROXICET) 5-325 MG per tablet 1 tablet (1 tablet Oral Given 05/11/17 0426)  sodium chloride 0.9 % bolus 1,000 mL (1,000 mLs Intravenous Given 05/11/17 0433)  iopamidol (ISOVUE-300) 61 % injection (100 mLs  Contrast Given 05/11/17 0548)     Initial Impression / Assessment and Plan / ED Course  I have reviewed the triage vital signs and the nursing notes.  Pertinent labs & imaging results that were available during my care of the patient were reviewed by me and considered in my medical decision making (see chart for details).     Patient presents with concerns for worsening left flank pain.  He was diagnosed with UTI 2 weeks ago but did not start antibiotics until 5 days ago.  Urinalysis still nitrite positive and appears acutely infected.  He is afebrile.  However, white count is 15.  He is high risk.  Urine culture was sent.  Patient was given IV Rocephin.  Will obtain CT scan to evaluate for any acute complications given duration of symptoms and leukocytosis in the setting of current antibiotic use.  7:04 AM CT scan concerning for pyelonephritis.  No drainable abscess or other complication.  He has no signs or symptoms of sepsis.  I have reviewed his prior chart.  He has failed ciprofloxacin before and has a levofloxacin allergy.  He has had  previous urine culture susceptible to Rocephin and cephalosporins.  Will place on Keflex.  After history, exam, and medical workup I feel the patient has been appropriately medically screened and is safe for discharge home. Pertinent diagnoses were discussed with the patient. Patient was given return precautions.   Final Clinical Impressions(s) / ED Diagnoses   Final diagnoses:  Pyelonephritis    ED Discharge Orders        Ordered    cephALEXin (KEFLEX) 500 MG capsule  3 times daily     05/11/17 0655    HYDROcodone-acetaminophen (NORCO/VICODIN) 5-325 MG tablet  Every 6 hours PRN     05/11/17 0655       Merryl Hacker, MD 05/11/17 [phone]

## 2017-05-11 NOTE — ED Notes (Signed)
ED Provider at bedside. 

## 2017-05-11 NOTE — Discharge Instructions (Addendum)
He was seen today for left flank pain.  You have evidence of a kidney infection.  Your antibiotics will be switched to Keflex.  If you develop fevers, worsening pain or any new or worsening symptoms you should be reevaluated immediately.

## 2017-05-13 LAB — URINE CULTURE

## 2017-05-14 ENCOUNTER — Telehealth: Payer: Self-pay

## 2017-05-14 NOTE — Telephone Encounter (Signed)
Post ED Visit - Positive Culture Follow-up  Culture report reviewed by antimicrobial stewardship pharmacist:  []  Enzo BiNathan Batchelder, Pharm.D. []  Celedonio MiyamotoJeremy Frens, Pharm.D., BCPS AQ-ID []  Garvin FilaMike Maccia, Pharm.D., BCPS [x]  Georgina PillionElizabeth Martin, [address]D., BCPS []  UlyssesMinh Pham, [address]D., BCPS, AAHIVP []  Estella HuskMichelle Turner, Pharm.D., BCPS, AAHIVP []  Lysle Pearlachel Rumbarger, PharmD, BCPS []  Blake DivineShannon Parkey, PharmD []  Pollyann SamplesAndy Johnston, PharmD, BCPS  Positive urine culture Treated with Cephalexin, organism sensitive to the same and no further patient follow-up is required at this time.  Jerry CarasCullom, Sherod Cisse Burnett 05/14/2017, 10:53 AM

## 2017-05-15 LAB — CULTURE, BLOOD (ROUTINE X 2)
CULTURE: NO GROWTH
Culture: NO GROWTH

## 2017-06-15 ENCOUNTER — Encounter (HOSPITAL_COMMUNITY): Payer: Self-pay | Admitting: Emergency Medicine

## 2017-06-15 ENCOUNTER — Emergency Department (HOSPITAL_COMMUNITY)
Admission: EM | Admit: 2017-06-15 | Discharge: 2017-06-15 | Disposition: A | Discharge status: Home / Self Care | Payer: Self-pay | Attending: Emergency Medicine | Admitting: Emergency Medicine

## 2017-06-15 DIAGNOSIS — R319 Hematuria, unspecified: Secondary | ICD-10-CM

## 2017-06-15 DIAGNOSIS — N390 Urinary tract infection, site not specified: Secondary | ICD-10-CM | POA: Insufficient documentation

## 2017-06-15 DIAGNOSIS — Q059 Spina bifida, unspecified: Secondary | ICD-10-CM | POA: Insufficient documentation

## 2017-06-15 DIAGNOSIS — M419 Scoliosis, unspecified: Secondary | ICD-10-CM | POA: Insufficient documentation

## 2017-06-15 LAB — CBC WITH DIFFERENTIAL/PLATELET
BASOS ABS: 0.1 10*3/uL (ref 0.0–0.1)
BASOS PCT: 1 %
EOS ABS: 0.1 10*3/uL (ref 0.0–0.7)
EOS PCT: 1 %
HCT: 46.7 % (ref 39.0–52.0)
Hemoglobin: 15.9 g/dL (ref 13.0–17.0)
Lymphocytes Relative: 20 %
Lymphs Abs: 2 10*3/uL (ref 0.7–4.0)
MCH: 31.4 pg (ref 26.0–34.0)
MCHC: 34 g/dL (ref 30.0–36.0)
MCV: 92.3 fL (ref 78.0–100.0)
MONO ABS: 0.9 10*3/uL (ref 0.1–1.0)
Monocytes Relative: 9 %
NEUTROS ABS: 7 10*3/uL (ref 1.7–7.7)
Neutrophils Relative %: 69 %
Platelets: 202 10*3/uL (ref 150–400)
RBC: 5.06 MIL/uL (ref 4.22–5.81)
RDW: 13.6 % (ref 11.5–15.5)
WBC: 10.1 10*3/uL (ref 4.0–10.5)

## 2017-06-15 LAB — URINALYSIS, ROUTINE W REFLEX MICROSCOPIC
Bilirubin Urine: NEGATIVE
GLUCOSE, UA: NEGATIVE mg/dL
KETONES UR: NEGATIVE mg/dL
Nitrite: NEGATIVE
PROTEIN: NEGATIVE mg/dL
Specific Gravity, Urine: 1.018 (ref 1.005–1.030)
pH: 7 (ref 5.0–8.0)

## 2017-06-15 LAB — COMPREHENSIVE METABOLIC PANEL
ALBUMIN: 4.3 g/dL (ref 3.5–5.0)
ALT: 14 U/L — ABNORMAL LOW (ref 17–63)
AST: 18 U/L (ref 15–41)
Alkaline Phosphatase: 78 U/L (ref 38–126)
Anion gap: 9 (ref 5–15)
BUN: 14 mg/dL (ref 6–20)
CHLORIDE: 104 mmol/L (ref 101–111)
CO2: 26 mmol/L (ref 22–32)
Calcium: 9.2 mg/dL (ref 8.9–10.3)
Creatinine, Ser: 0.96 mg/dL (ref 0.61–1.24)
GFR calc Af Amer: >60 mL/min (ref >60)
GFR calc non Af Amer: >60 mL/min (ref >60)
GLUCOSE: 97 mg/dL (ref 65–99)
POTASSIUM: 4.3 mmol/L (ref 3.5–5.1)
Sodium: 139 mmol/L (ref 135–145)
Total Bilirubin: 0.7 mg/dL (ref 0.3–1.2)
Total Protein: 7.4 g/dL (ref 6.5–8.1)

## 2017-06-15 LAB — LIPASE, BLOOD: LIPASE: 28 U/L (ref 11–51)

## 2017-06-15 MED ORDER — CEPHALEXIN 500 MG PO CAPS: 500.0000 mg | ORAL_CAPSULE | Freq: Two times a day (BID) | ORAL | 0 refills | Status: AC

## 2017-06-15 MED ORDER — STERILE WATER FOR INJECTION IJ SOLN
INTRAMUSCULAR | Status: AC
  Administered 2017-06-15: 10 mL
  Filled 2017-06-15: qty 10

## 2017-06-15 MED ORDER — CEFTRIAXONE SODIUM 1 G IJ SOLR
1.0000 g | Freq: Once | INTRAMUSCULAR | Status: AC
  Administered 2017-06-15: 1 g via INTRAMUSCULAR
  Filled 2017-06-15: qty 10

## 2017-06-15 NOTE — ED Provider Notes (Signed)
[facility] DEPT Provider Note   CSN: [id number] Arrival date & time: 06/15/17  2751     History   Chief Complaint Chief Complaint  Patient presents with  . Flank Pain    HPI Riley Simmons is a 22 y.o. male.  HPI  Patient is a 22 year old male with history of spina bifida, scoliosis, frequent UTIs who presents the ED today complaining of urinary frequency that has been ongoing for the last 4 days.  He also started having left flank pain and left upper quadrant abdominal pain yesterday.  Left upper quadrant abdominal pain has since resolved however left flank pain is constant and persistent.  Rates it as severe in nature.  He reports chills at home but has had no documented fevers.  Also endorses nausea but has had no episodes of vomiting.  No diarrhea or constipation.  No dysuria or hematuria.  No blood in stool and no hematemesis.   States he was recently diagnosed with UTI about 2 weeks ago and was placed on Keflex for pyelonephritis.  States that symptoms had resolved but have now returned because he is unable to properly self cath at home because he cannot afford catheters at home.  Past Medical History:  Diagnosis Date  . Marijuana smoker, continuous   . Scoliosis   . Spina bifida ([place])   . Urethritis     There are no active problems to display for this patient.   History reviewed. No pertinent surgical history.      Home Medications    Prior to Admission medications   Medication Sig Start Date End Date Taking? Authorizing Provider  HYDROcodone-acetaminophen (NORCO/VICODIN) 5-325 MG tablet Take 1 tablet by mouth every 6 (six) hours as needed. 05/11/17  Yes Horton, Barbette Hair, MD  Catheters KIT Coloplast 12 french Provide 30 self catheterization kits for PRN catheterization 07/31/16   Pisciotta, Elmyra Ricks, PA-C  cephALEXin (KEFLEX) 500 MG capsule Take 1 capsule (500 mg total) by mouth 2 (two) times daily for 10 days. 06/15/17 06/25/17   Fatoumata Albaugh S, PA-C  ondansetron (ZOFRAN ODT) 4 MG disintegrating tablet Take 1 tablet (4 mg total) by mouth every 8 (eight) hours as needed for nausea or vomiting. Patient not taking: Reported on 06/15/2017 05/11/17   Horton, Barbette Hair, MD  oxybutynin (DITROPAN XL) 10 MG 24 hr tablet Take 1 tablet (10 mg total) by mouth at bedtime. Patient not taking: Reported on 11/09/2015 06/05/15   Muthersbaugh, Jarrett Soho, PA-C  oxyCODONE-acetaminophen (PERCOCET/ROXICET) 5-325 MG tablet Take 1-2 tablets by mouth every 6 (six) hours as needed for severe pain. Patient not taking: Reported on 05/11/2017 11/09/15   Horton, Barbette Hair, MD  phenazopyridine (PYRIDIUM) 200 MG tablet Take 1 tablet (200 mg total) by mouth 3 (three) times daily. Patient not taking: Reported on 05/11/2017 07/22/16   Hedges, Dellis Filbert, PA-C  phenazopyridine (PYRIDIUM) 95 MG tablet Take 1 tablet (95 mg total) by mouth 3 (three) times daily as needed for pain. Patient not taking: Reported on 05/11/2017 11/09/16   Virgel Manifold, MD    Family History No family history on file.  Social History Social History   Tobacco Use  . Smoking status: Current Some Day Smoker  . Smokeless tobacco: Never Used  Substance Use Topics  . Alcohol use: Yes  . Drug use: Yes    Types: Marijuana     Allergies   Levofloxacin; Amoxicillin; and Latex   Review of Systems Review of Systems  Constitutional: Negative for appetite change  and fever.  HENT: Negative for ear pain and sore throat.   Eyes: Negative for visual disturbance.  Respiratory: Negative for cough and shortness of breath.   Cardiovascular: Negative for chest pain.  Gastrointestinal: Positive for abdominal pain (resolved) and nausea. Negative for blood in stool, constipation, diarrhea and vomiting.  Genitourinary: Positive for flank pain and frequency. Negative for dysuria, hematuria and urgency.  Musculoskeletal: Negative for back pain.  Skin: Negative for rash.  Neurological: Negative for  headaches.  All other systems reviewed and are negative.    Physical Exam Updated Vital Signs BP 104/68 (BP Location: Left Arm)   Pulse 82   Temp 98.1 F (36.7 C) (Oral)   Resp 16   Ht '5\' 5"'  (1.651 m)   Wt 63.5 kg (140 lb)   SpO2 100%   BMI 23.30 kg/m   Physical Exam  Constitutional: He appears well-developed and well-nourished. No distress.  nontoxic  HENT:  Head: Normocephalic and atraumatic.  Mouth/Throat: Oropharynx is clear and moist.  Eyes: Conjunctivae are normal.  Neck: Neck supple.  Cardiovascular: Normal rate, regular rhythm and normal heart sounds.  No murmur heard. Pulmonary/Chest: Effort normal and breath sounds normal. No stridor. No respiratory distress. He has no wheezes.  Abdominal: Soft. Bowel sounds are normal. There is no rebound.  Mild left cva ttp, no overlying skin changes, mild ttp to LUQ without guarding  Musculoskeletal: He exhibits no edema.  Neurological: He is alert.  Skin: Skin is warm and dry.  Psychiatric: He has a normal mood and affect.  Nursing note and vitals reviewed.    ED Treatments / Results  Labs (all labs ordered are listed, but only abnormal results are displayed) Labs Reviewed  URINALYSIS, ROUTINE W REFLEX MICROSCOPIC - Abnormal; Notable for the following components:      Result Value   Hgb urine dipstick SMALL (*)    Leukocytes, UA MODERATE (*)    WBC, UA >50 (*)    Bacteria, UA RARE (*)    All other components within normal limits  COMPREHENSIVE METABOLIC PANEL - Abnormal; Notable for the following components:   ALT 14 (*)    All other components within normal limits  URINE CULTURE  CBC WITH DIFFERENTIAL/PLATELET  LIPASE, BLOOD    EKG None  Radiology No results found.  Procedures Procedures (including critical care time)  Medications Ordered in ED Medications  cefTRIAXone (ROCEPHIN) injection 1 g (1 g Intramuscular Given 06/15/17 1017)  sterile water (preservative free) injection (10 mLs  Given 06/15/17  1018)     Initial Impression / Assessment and Plan / ED Course  I have reviewed the triage vital signs and the nursing notes.  Pertinent labs & imaging results that were available during my care of the patient were reviewed by me and considered in my medical decision making (see chart for details).   9:49 PM Spoke with Anglea from Case Management. Pt is resident of [facility]. There is a free clinic in [place] that he can go to. He may also be able to be seen at [facility]. She placed information on the AVS and spoke with patient about this plan.   Discussed pt presentation and exam findings with Dr. Ashok Cordia, who personally evaluated the patient and does not feel the patient needs a CT scan at this time as he appears well.  States that his labs are normal patient safe to be discharged on 10 days of Keflex.   Final Clinical Impressions(s) / ED Diagnoses  Final diagnoses:  Urinary tract infection with hematuria, site unspecified   Pt is afebrile, normotensive, and denies vomiting. Has some left CVA ttp, but otherwise abdominal exam is benign. Pt has been diagnosed with a UTI.  Urine culture sent.  Labs without leukocytosis or anemia.  Normal electrolytes, kidney function and liver function.  Lipase negative. Pt to be dc home with antibiotics and instructions to follow up with urology given his frequent UTIs.  Will give Rx for Keflex as last urine culture showed sensitivity to this.  Case management consulted and gave patient resources to follow-up and to help him obtain supplies for home cath.  All questions answered and patient understands the plan and reasons to follow-up immediately in the ED  ED Discharge Orders        Ordered    cephALEXin (KEFLEX) 500 MG capsule  2 times daily     06/15/17 [address], [place], PA-C 06/15/17 1816    Lajean Saver, MD 06/16/17 [phone]

## 2017-06-15 NOTE — ED Notes (Signed)
Urine culture sent down to lab  

## 2017-06-15 NOTE — Care Management Note (Signed)
Case Management Note  CM consulted for assitance in affording self-cath supplies.  CM noted pt's address is in [place] with no PCP and no ins.  Placed information on AVS for the Free Clinic in [place].  Also placed [facility] and [facility] for PCP and financial counseling/pharmacy needs on the AVS.  Updated [facility], PA.  CM spoke with pt and discussed all clinics.  Pt reports he is moving into an apartment in the [place] area and would like to keep everything local.  CM advised all of the information was on his AVS for choice.  No further CM needs noted at this time.

## 2017-06-15 NOTE — ED Triage Notes (Signed)
Pt reports that he has to self cath and gets recurrent UTIs. Reports flank pain on left side for 2 days. Doesn't have insurance and needing help getting cheaper self caths.

## 2017-06-15 NOTE — Discharge Instructions (Signed)
You were given a prescription for antibiotics. Please take the antibiotic prescription fully.   I have prescribed a new medication for you today. It is important that when you pick the prescription up you discuss the potential interactions of this medication with other medications you are taking, including over the counter medications, with the pharmacists.   This new medication has potential side effects. Be sure to contact your primary care provider or return to the emergency department if you are experiencing new symptoms that you are unable to tolerate after starting the medication. You need to receive medical evaluation immediately if you start to experience blistering of the skin, rash, swelling, or difficulty breathing as these signs could indicate a more serious medication side effect.   You were given information to follow-up with a urologist with regards to your frequent urinary tract infections.  You may also follow-up with the resources that the case manager provided for you. Please return to the ER sooner if you have any new or worsening symptoms, or if you have any of the following symptoms:  Pain with urination, frequent urination, blood in your urine Abdominal pain that does not go away.  You have a fever.  You keep throwing up (vomiting).  The pain is felt only in portions of the abdomen. Pain in the right side could possibly be appendicitis. In an adult, pain in the left lower portion of the abdomen could be colitis or diverticulitis.  You pass bloody or black tarry stools.  There is bright red blood in the stool.  The constipation stays for more than 4 days.  There is belly (abdominal) or rectal pain.  You do not seem to be getting better.  You have any questions or concerns.

## 2017-06-17 LAB — URINE CULTURE: Culture: >=100000 — AB

## 2017-06-18 ENCOUNTER — Telehealth: Payer: Self-pay | Admitting: *Deleted

## 2017-06-18 NOTE — Telephone Encounter (Signed)
Post ED Visit - Positive Culture Follow-up  Culture report reviewed by antimicrobial stewardship pharmacist:   Enzo Bi, Pharm.D.  Celedonio Miyamoto, Pharm.D., BCPS AQ-ID  Garvin Fila, Pharm.D., BCPS  Georgina Pillion, Pharm.D., BCPS  [place], [address].D., BCPS, AAHIVP  Estella Husk, Pharm.D., BCPS, AAHIVP  Lysle Pearl, PharmD, BCPS  Sherlynn Carbon, PharmD  Pollyann Samples, PharmD, BCPS  Positive urine culture Treated with Cephalexin, organism sensitive to the same and no further patient follow-up is required at this time.  Virl Axe [facility] 06/18/2017, 12:05 PM

## 2017-07-22 ENCOUNTER — Emergency Department (HOSPITAL_COMMUNITY)
Admission: EM | Admit: 2017-07-22 | Discharge: 2017-07-23 | Disposition: A | Discharge status: Home / Self Care | Payer: Self-pay | Attending: Emergency Medicine | Admitting: Emergency Medicine

## 2017-07-22 ENCOUNTER — Emergency Department (HOSPITAL_COMMUNITY): Payer: Self-pay

## 2017-07-22 ENCOUNTER — Other Ambulatory Visit: Payer: Self-pay

## 2017-07-22 ENCOUNTER — Encounter (HOSPITAL_COMMUNITY): Payer: Self-pay | Admitting: Emergency Medicine

## 2017-07-22 DIAGNOSIS — N451 Epididymitis: Secondary | ICD-10-CM | POA: Insufficient documentation

## 2017-07-22 DIAGNOSIS — N50819 Testicular pain, unspecified: Secondary | ICD-10-CM

## 2017-07-22 DIAGNOSIS — N3000 Acute cystitis without hematuria: Secondary | ICD-10-CM | POA: Insufficient documentation

## 2017-07-22 DIAGNOSIS — Z91040 Latex allergy status: Secondary | ICD-10-CM | POA: Insufficient documentation

## 2017-07-22 DIAGNOSIS — F17200 Nicotine dependence, unspecified, uncomplicated: Secondary | ICD-10-CM | POA: Insufficient documentation

## 2017-07-22 LAB — URINALYSIS, ROUTINE W REFLEX MICROSCOPIC
Bilirubin Urine: NEGATIVE
Glucose, UA: NEGATIVE mg/dL
Hgb urine dipstick: NEGATIVE
Ketones, ur: NEGATIVE mg/dL
Nitrite: POSITIVE — AB
Protein, ur: NEGATIVE mg/dL
Specific Gravity, Urine: 1.025 (ref 1.005–1.030)
WBC, UA: >50 WBC/hpf — ABNORMAL HIGH (ref 0–5)
pH: 6 (ref 5.0–8.0)

## 2017-07-22 MED ORDER — SULFAMETHOXAZOLE-TRIMETHOPRIM 800-160 MG PO TABS
1.0000 | ORAL_TABLET | Freq: Once | ORAL | Status: AC
  Administered 2017-07-22: 1 via ORAL
  Filled 2017-07-22: qty 1

## 2017-07-22 MED ORDER — OXYCODONE-ACETAMINOPHEN 5-325 MG PO TABS
1.0000 | ORAL_TABLET | Freq: Once | ORAL | Status: AC
  Administered 2017-07-22: 1 via ORAL
  Filled 2017-07-22: qty 1

## 2017-07-22 NOTE — ED Triage Notes (Addendum)
Pt comes in with groin pain starting yesterday with R testicle swelling with pain. Pt has Hx of spina bifida and self caths. Pt was heavy lifting the other day and pain started yesterday. Pain 9/10. Two tylenol about 2pm today.

## 2017-07-22 NOTE — ED Notes (Signed)
Pt called for VS with no answer x2

## 2017-07-22 NOTE — ED Notes (Signed)
Pt called for VS recheck with no answer 

## 2017-07-23 LAB — GC/CHLAMYDIA PROBE AMP (~~LOC~~) NOT AT ARMC
CHLAMYDIA, DNA PROBE: NEGATIVE
NEISSERIA GONORRHEA: NEGATIVE

## 2017-07-23 MED ORDER — SULFAMETHOXAZOLE-TRIMETHOPRIM 800-160 MG PO TABS: 1.0000 | ORAL_TABLET | Freq: Two times a day (BID) | ORAL | 0 refills | Status: AC

## 2017-07-23 MED ORDER — HYDROCODONE-ACETAMINOPHEN 5-325 MG PO TABS: 1.0000 | ORAL_TABLET | ORAL | 0 refills | Status: DC | PRN

## 2017-07-23 NOTE — ED Provider Notes (Signed)
[facility] EMERGENCY DEPARTMENT Provider Note   CSN: [id number] Arrival date & time: 07/22/17  1847     History   Chief Complaint Chief Complaint  Patient presents with  . Groin Pain  . Testicle Pain    HPI Riley Simmons is a 22 y.o. male.  Patient presents with right testicular pain that started yesterday (07/21/17). He denies trauma, although he reports doing heavy lifting 1-2 days prior to onset of pain. No testicular swelling, dysuria, penile discharge. He states he has frequent UTI's secondary to self-catheterizing (h/o spina bifida) but denies fever, abdominal pain, nausea.   The history is provided by the patient. No language interpreter was used.  Groin Pain  Pertinent negatives include no abdominal pain.  Testicle Pain  Pertinent negatives include no abdominal pain.    Past Medical History:  Diagnosis Date  . Marijuana smoker, continuous   . Scoliosis   . Spina bifida ([place])   . Urethritis     There are no active problems to display for this patient.   History reviewed. No pertinent surgical history.      Home Medications    Prior to Admission medications   Medication Sig Start Date End Date Taking? Authorizing Provider  Catheters KIT Coloplast 12 french Provide 30 self catheterization kits for PRN catheterization Patient not taking: Reported on 07/22/2017 07/31/16   Pisciotta, Elmyra Ricks, PA-C  HYDROcodone-acetaminophen (NORCO/VICODIN) 5-325 MG tablet Take 1 tablet by mouth every 6 (six) hours as needed. Patient not taking: Reported on 07/22/2017 05/11/17   Horton, Barbette Hair, MD  ondansetron (ZOFRAN ODT) 4 MG disintegrating tablet Take 1 tablet (4 mg total) by mouth every 8 (eight) hours as needed for nausea or vomiting. Patient not taking: Reported on 06/15/2017 05/11/17   Horton, Barbette Hair, MD  oxybutynin (DITROPAN XL) 10 MG 24 hr tablet Take 1 tablet (10 mg total) by mouth at bedtime. Patient not taking: Reported on 11/09/2015 06/05/15    Muthersbaugh, Jarrett Soho, PA-C  oxyCODONE-acetaminophen (PERCOCET/ROXICET) 5-325 MG tablet Take 1-2 tablets by mouth every 6 (six) hours as needed for severe pain. Patient not taking: Reported on 05/11/2017 11/09/15   Horton, Barbette Hair, MD  phenazopyridine (PYRIDIUM) 200 MG tablet Take 1 tablet (200 mg total) by mouth 3 (three) times daily. Patient not taking: Reported on 05/11/2017 07/22/16   Hedges, Dellis Filbert, PA-C  phenazopyridine (PYRIDIUM) 95 MG tablet Take 1 tablet (95 mg total) by mouth 3 (three) times daily as needed for pain. Patient not taking: Reported on 05/11/2017 11/09/16   Virgel Manifold, MD    Family History No family history on file.  Social History Social History   Tobacco Use  . Smoking status: Current Some Day Smoker  . Smokeless tobacco: Never Used  Substance Use Topics  . Alcohol use: Yes  . Drug use: Yes    Types: Marijuana     Allergies   Levofloxacin; Amoxicillin; and Latex   Review of Systems Review of Systems  Constitutional: Negative for fever.  Gastrointestinal: Negative for abdominal pain and nausea.  Genitourinary: Positive for testicular pain. Negative for dysuria and scrotal swelling.  Musculoskeletal: Negative for back pain.     Physical Exam Updated Vital Signs BP 122/79 (BP Location: Right Arm)   Pulse 66   Temp 98.8 F (37.1 C) (Oral)   Resp 16   Wt 61.1 kg (134 lb 11.2 oz)   SpO2 100%   BMI 22.42 kg/m   Physical Exam  Constitutional: He is oriented to person,  place, and time. He appears well-developed and well-nourished.  Neck: Normal range of motion.  Pulmonary/Chest: Effort normal.  Abdominal: Soft. There is no tenderness.  Genitourinary:  Genitourinary Comments: Circumcised. Significantly tender right testicle, nontender left. No swelling, discoloration. No penile discharge.   Musculoskeletal: Normal range of motion.  Neurological: He is alert and oriented to person, place, and time.  Skin: Skin is warm and dry.  Psychiatric: He  has a normal mood and affect.     ED Treatments / Results  Labs (all labs ordered are listed, but only abnormal results are displayed) Labs Reviewed  URINALYSIS, ROUTINE W REFLEX MICROSCOPIC - Abnormal; Notable for the following components:      Result Value   APPearance HAZY (*)    Nitrite POSITIVE (*)    Leukocytes, UA LARGE (*)    WBC, UA >50 (*)    Bacteria, UA MANY (*)    All other components within normal limits  GC/CHLAMYDIA PROBE AMP () NOT AT [facility]   Results for orders placed or performed during the hospital encounter of 07/22/17  Urinalysis, Routine w reflex microscopic  Result Value Ref Range   Color, Urine YELLOW YELLOW   APPearance HAZY (A) CLEAR   Specific Gravity, Urine 1.025 1.005 - 1.030   pH 6.0 5.0 - 8.0   Glucose, UA NEGATIVE NEGATIVE mg/dL   Hgb urine dipstick NEGATIVE NEGATIVE   Bilirubin Urine NEGATIVE NEGATIVE   Ketones, ur NEGATIVE NEGATIVE mg/dL   Protein, ur NEGATIVE NEGATIVE mg/dL   Nitrite POSITIVE (A) NEGATIVE   Leukocytes, UA LARGE (A) NEGATIVE   RBC / HPF 0-5 0 - 5 RBC/hpf   WBC, UA >50 (H) 0 - 5 WBC/hpf   Bacteria, UA MANY (A) NONE SEEN   Mucus PRESENT      EKG None  Radiology US Scrotum Doppler  Result Date: 07/22/2017 CLINICAL DATA:  22 year old male with acute RIGHT testicular pain and swelling for 1 day. EXAM: SCROTAL ULTRASOUND DOPPLER ULTRASOUND OF THE TESTICLES TECHNIQUE: Complete ultrasound examination of the testicles, epididymis, and other scrotal structures was performed. Color and spectral Doppler ultrasound were also utilized to evaluate blood flow to the testicles. COMPARISON:  None. FINDINGS: Right testicle Measurements: 4.3 x 2.6 x 3 cm. No mass or microlithiasis visualized. Left testicle Measurements: 4.4 x 2.3 x 2.8 cm. No mass or microlithiasis visualized. Right epididymis: Enlarged and hypervascular compatible with epididymitis. Left epididymis:  Normal in size and appearance. Hydrocele:  A small RIGHT  hydrocele is noted. Varicocele:  None visualized. Pulsed Doppler interrogation of both testes demonstrates normal low resistance arterial and venous waveforms bilaterally. IMPRESSION: 1. RIGHT epididymitis with small RIGHT hydrocele. 2. Normal testicles.  No evidence of testicular torsion. Electronically Signed   By: Margarette Canada M.D.   On: 07/22/2017 20:57    Procedures Procedures (including critical care time)  Medications Ordered in ED Medications  oxyCODONE-acetaminophen (PERCOCET/ROXICET) 5-325 MG per tablet 1 tablet (1 tablet Oral Given 07/22/17 2326)  sulfamethoxazole-trimethoprim (BACTRIM DS,SEPTRA DS) 800-160 MG per tablet 1 tablet (1 tablet Oral Given 07/22/17 2326)     Initial Impression / Assessment and Plan / ED Course  I have reviewed the triage vital signs and the nursing notes.  Pertinent labs & imaging results that were available during my care of the patient were reviewed by me and considered in my medical decision making (see chart for details).     Patient with right testicular pain x 1 day.   Ultrasound done with evidence of  epididymitis. He also has a UTI with positive nitrites and many bacteria. Will start on Bactrim, provide pain relief and recommend urology referral.   Final Clinical Impressions(s) / ED Diagnoses   Final diagnoses:  Testicular pain   1. Right epididymitis 2. UTI  ED Discharge Orders    None       Charlann Lange, PA-C 07/23/17 0016    Veryl Speak, MD 07/23/17 [phone]

## 2017-07-25 LAB — URINE CULTURE

## 2017-07-26 ENCOUNTER — Telehealth: Payer: Self-pay | Admitting: Emergency Medicine

## 2017-07-26 NOTE — Telephone Encounter (Signed)
Post ED Visit - Positive Culture Follow-up  Culture report reviewed by antimicrobial stewardship pharmacist:  []  Riley Simmons, Pharm.D. []  Riley Simmons, Pharm.D., BCPS AQ-ID []  Riley Simmons, Pharm.D., BCPS [x]  Riley Simmons, [address]Pharm[address] BCPS []  Riley Simmons, [place] BCPS, AAHIVP []  Riley Simmons, Pharm.D., BCPS, AAHIVP []  Riley Simmons, PharmD, BCPS []  Riley Simmons, PharmD []  Riley Simmons, PharmD, BCPS  Positive urine culture Treated with sulfamethoxazole-trimethoprim, organism sensitive to the same and no further patient follow-up is required at this time.  Riley Simmons, Riley Simmons 07/26/2017, 12:46 PM

## 2017-07-29 ENCOUNTER — Emergency Department (HOSPITAL_COMMUNITY)
Admission: EM | Admit: 2017-07-29 | Discharge: 2017-07-29 | Disposition: A | Discharge status: Home / Self Care | Payer: Self-pay | Attending: Emergency Medicine | Admitting: Emergency Medicine

## 2017-07-29 ENCOUNTER — Encounter (HOSPITAL_COMMUNITY): Payer: Self-pay

## 2017-07-29 DIAGNOSIS — F17210 Nicotine dependence, cigarettes, uncomplicated: Secondary | ICD-10-CM | POA: Insufficient documentation

## 2017-07-29 DIAGNOSIS — R197 Diarrhea, unspecified: Secondary | ICD-10-CM | POA: Insufficient documentation

## 2017-07-29 DIAGNOSIS — Z91040 Latex allergy status: Secondary | ICD-10-CM | POA: Insufficient documentation

## 2017-07-29 LAB — URINALYSIS, ROUTINE W REFLEX MICROSCOPIC
Bilirubin Urine: NEGATIVE
Glucose, UA: NEGATIVE mg/dL
Hgb urine dipstick: NEGATIVE
Ketones, ur: NEGATIVE mg/dL
Leukocytes, UA: NEGATIVE
Nitrite: NEGATIVE
Protein, ur: NEGATIVE mg/dL
Specific Gravity, Urine: 1.021 (ref 1.005–1.030)
pH: 5 (ref 5.0–8.0)

## 2017-07-29 NOTE — ED Provider Notes (Signed)
[facility] EMERGENCY DEPARTMENT Provider Note   CSN: [id number] Arrival date & time: 07/29/17  1252   History   Chief Complaint No chief complaint on file.   HPI Riley Simmons is a 22 y.o. male.  HPI   22 year old male presents today with complaints of diarrhea.  Patient notes he was seen approximately 7 days ago and diagnosed with epididymitis and UTI.  He was placed on Bactrim and discharged home.  Patient notes he self caths and noted he has been continued to have the cath more, he denies any change in order color or clarity of his urine.  Denies any fever abdominal pain he notes the testicular swelling and pain has resolved.  Patient notes that today he developed nonbloody diarrhea with no sedation nausea vomiting or abdominal pain.  Past Medical History:  Diagnosis Date  . Marijuana smoker, continuous   . Scoliosis   . Spina bifida ([place])   . Urethritis     There are no active problems to display for this patient.   History reviewed. No pertinent surgical history.      Home Medications    Prior to Admission medications   Medication Sig Start Date End Date Taking? Authorizing Provider  Catheters KIT Coloplast 12 french Provide 30 self catheterization kits for PRN catheterization Patient not taking: Reported on 07/22/2017 07/31/16   Pisciotta, Elmyra Ricks, PA-C  HYDROcodone-acetaminophen (NORCO/VICODIN) 5-325 MG tablet Take 1-2 tablets by mouth every 4 (four) hours as needed. 07/23/17   Charlann Lange, PA-C  ondansetron (ZOFRAN ODT) 4 MG disintegrating tablet Take 1 tablet (4 mg total) by mouth every 8 (eight) hours as needed for nausea or vomiting. Patient not taking: Reported on 06/15/2017 05/11/17   Horton, Barbette Hair, MD  oxybutynin (DITROPAN XL) 10 MG 24 hr tablet Take 1 tablet (10 mg total) by mouth at bedtime. Patient not taking: Reported on 11/09/2015 06/05/15   Muthersbaugh, Jarrett Soho, PA-C  oxyCODONE-acetaminophen (PERCOCET/ROXICET) 5-325 MG tablet Take  1-2 tablets by mouth every 6 (six) hours as needed for severe pain. Patient not taking: Reported on 05/11/2017 11/09/15   Horton, Barbette Hair, MD  phenazopyridine (PYRIDIUM) 200 MG tablet Take 1 tablet (200 mg total) by mouth 3 (three) times daily. Patient not taking: Reported on 05/11/2017 07/22/16   Joby Hershkowitz, Dellis Filbert, PA-C  phenazopyridine (PYRIDIUM) 95 MG tablet Take 1 tablet (95 mg total) by mouth 3 (three) times daily as needed for pain. Patient not taking: Reported on 05/11/2017 11/09/16   Virgel Manifold, MD  sulfamethoxazole-trimethoprim (BACTRIM DS,SEPTRA DS) 800-160 MG tablet Take 1 tablet by mouth 2 (two) times daily for 7 days. 07/23/17 07/30/17  Charlann Lange, PA-C    Family History No family history on file.  Social History Social History   Tobacco Use  . Smoking status: Current Some Day Smoker  . Smokeless tobacco: Never Used  Substance Use Topics  . Alcohol use: Yes  . Drug use: Yes    Types: Marijuana     Allergies   Levofloxacin; Amoxicillin; and Latex   Review of Systems Review of Systems  All other systems reviewed and are negative.    Physical Exam Updated Vital Signs BP 132/84   Pulse 74   Temp 98.5 F (36.9 C) (Oral)   Resp 18   SpO2 99%   Physical Exam  Constitutional: He is oriented to person, place, and time. He appears well-developed and well-nourished.  HENT:  Head: Normocephalic and atraumatic.  Eyes: Pupils are equal, round, and reactive to  light. Conjunctivae are normal. Right eye exhibits no discharge. Left eye exhibits no discharge. No scleral icterus.  Neck: Normal range of motion. No JVD present. No tracheal deviation present.  Pulmonary/Chest: Effort normal. No stridor.  Abdominal: Soft. He exhibits no distension and no mass. There is no tenderness. There is no rebound and no guarding. No hernia.  Neurological: He is alert and oriented to person, place, and time. Coordination normal.  Psychiatric: He has a normal mood and affect. His  behavior is normal. Judgment and thought content normal.  Nursing note and vitals reviewed.    ED Treatments / Results  Labs (all labs ordered are listed, but only abnormal results are displayed) Labs Reviewed  URINALYSIS, ROUTINE W REFLEX MICROSCOPIC - Abnormal; Notable for the following components:      Result Value   APPearance HAZY (*)    All other components within normal limits    EKG None  Radiology No results found.  Procedures Procedures (including critical care time)  Medications Ordered in ED Medications - No data to display   Initial Impression / Assessment and Plan / ED Course  I have reviewed the triage vital signs and the nursing notes.  Pertinent labs & imaging results that were available during my care of the patient were reviewed by me and considered in my medical decision making (see chart for details).     22 year old male presents today with complaints of diarrhea.  Patient well-appearing no acute distress, afebrile with a nontender abdomen.  Patient with recent initiation of Bactrim.  He has had 6 to 7 days of therapy with complete resolution of symptoms.  Patient's urine clear here, chart review shows GC was negative, urine positive for E. coli which Bactrim is sensitive to.  I do not feel adding on another antibiotic would be appropriate given his clean urine and resolution of testicular symptoms. He will discontinue bactrim.  Patient will return immediately with any new or worsening signs or symptoms.  Patient verbalized understanding and agreement to today's plan had no further questions or concerns.  Final Clinical Impressions(s) / ED Diagnoses   Final diagnoses:  Diarrhea, unspecified type    ED Discharge Orders    None       Okey Regal, PA-C 07/29/17 1533    Pattricia Boss, MD 08/09/17 1406

## 2017-07-29 NOTE — Discharge Instructions (Addendum)
Please read attached information. If you experience any new or worsening signs or symptoms please return to the emergency room for evaluation. Please follow-up with your primary care provider or specialist as discussed.  °

## 2017-07-29 NOTE — ED Triage Notes (Signed)
Patient here requesting antibiotic change due to diarrhea and requesting work note, NAD

## 2017-08-16 ENCOUNTER — Inpatient Hospital Stay (HOSPITAL_COMMUNITY)
Admission: EM | Admit: 2017-08-16 | Discharge: 2017-08-18 | DRG: 728 | Disposition: A | Discharge status: Home / Self Care | Payer: Self-pay | Attending: Internal Medicine | Admitting: Internal Medicine

## 2017-08-16 ENCOUNTER — Encounter (HOSPITAL_COMMUNITY): Payer: Self-pay

## 2017-08-16 ENCOUNTER — Emergency Department (HOSPITAL_COMMUNITY): Payer: Self-pay

## 2017-08-16 ENCOUNTER — Other Ambulatory Visit: Payer: Self-pay

## 2017-08-16 ENCOUNTER — Ambulatory Visit (HOSPITAL_COMMUNITY): Payer: Self-pay

## 2017-08-16 DIAGNOSIS — N50811 Right testicular pain: Secondary | ICD-10-CM

## 2017-08-16 DIAGNOSIS — R112 Nausea with vomiting, unspecified: Secondary | ICD-10-CM

## 2017-08-16 DIAGNOSIS — Z87440 Personal history of urinary (tract) infections: Secondary | ICD-10-CM

## 2017-08-16 DIAGNOSIS — Z8619 Personal history of other infectious and parasitic diseases: Secondary | ICD-10-CM

## 2017-08-16 DIAGNOSIS — Z91040 Latex allergy status: Secondary | ICD-10-CM

## 2017-08-16 DIAGNOSIS — K5900 Constipation, unspecified: Secondary | ICD-10-CM | POA: Diagnosis present

## 2017-08-16 DIAGNOSIS — Q059 Spina bifida, unspecified: Secondary | ICD-10-CM

## 2017-08-16 DIAGNOSIS — Z960 Presence of urogenital implants: Secondary | ICD-10-CM

## 2017-08-16 DIAGNOSIS — Z87448 Personal history of other diseases of urinary system: Secondary | ICD-10-CM

## 2017-08-16 DIAGNOSIS — L818 Other specified disorders of pigmentation: Secondary | ICD-10-CM

## 2017-08-16 DIAGNOSIS — Z881 Allergy status to other antibiotic agents status: Secondary | ICD-10-CM

## 2017-08-16 DIAGNOSIS — F17210 Nicotine dependence, cigarettes, uncomplicated: Secondary | ICD-10-CM | POA: Diagnosis present

## 2017-08-16 DIAGNOSIS — N319 Neuromuscular dysfunction of bladder, unspecified: Secondary | ICD-10-CM | POA: Diagnosis present

## 2017-08-16 DIAGNOSIS — N451 Epididymitis: Principal | ICD-10-CM | POA: Diagnosis present

## 2017-08-16 DIAGNOSIS — R109 Unspecified abdominal pain: Secondary | ICD-10-CM

## 2017-08-16 DIAGNOSIS — D72829 Elevated white blood cell count, unspecified: Secondary | ICD-10-CM

## 2017-08-16 DIAGNOSIS — M419 Scoliosis, unspecified: Secondary | ICD-10-CM | POA: Diagnosis present

## 2017-08-16 LAB — URINALYSIS, ROUTINE W REFLEX MICROSCOPIC
Bacteria, UA: NONE SEEN
Bilirubin Urine: NEGATIVE
GLUCOSE, UA: NEGATIVE mg/dL
HGB URINE DIPSTICK: NEGATIVE
Ketones, ur: 20 mg/dL — AB
Leukocytes, UA: NEGATIVE
NITRITE: NEGATIVE
PH: 5 (ref 5.0–8.0)
Protein, ur: 30 mg/dL — AB
Specific Gravity, Urine: 1.028 (ref 1.005–1.030)

## 2017-08-16 LAB — CBC WITH DIFFERENTIAL/PLATELET
Abs Immature Granulocytes: 0.2 10*3/uL — ABNORMAL HIGH (ref 0.0–0.1)
BASOS ABS: 0.1 10*3/uL (ref 0.0–0.1)
BASOS PCT: 0 %
EOS ABS: 0 10*3/uL (ref 0.0–0.7)
EOS PCT: 0 %
HCT: 44.3 % (ref 39.0–52.0)
Hemoglobin: 14.6 g/dL (ref 13.0–17.0)
Immature Granulocytes: 1 %
Lymphocytes Relative: 5 %
Lymphs Abs: 1.2 10*3/uL (ref 0.7–4.0)
MCH: 30.2 pg (ref 26.0–34.0)
MCHC: 33 g/dL (ref 30.0–36.0)
MCV: 91.5 fL (ref 78.0–100.0)
Monocytes Absolute: 1.7 10*3/uL — ABNORMAL HIGH (ref 0.1–1.0)
Monocytes Relative: 7 %
Neutro Abs: 20.3 10*3/uL — ABNORMAL HIGH (ref 1.7–7.7)
Neutrophils Relative %: 87 %
PLATELETS: 231 10*3/uL (ref 150–400)
RBC: 4.84 MIL/uL (ref 4.22–5.81)
RDW: 13.1 % (ref 11.5–15.5)
WBC: 23.4 10*3/uL — ABNORMAL HIGH (ref 4.0–10.5)

## 2017-08-16 LAB — I-STAT CG4 LACTIC ACID, ED
LACTIC ACID, VENOUS: 0.8 mmol/L (ref 0.5–1.9)
Lactic Acid, Venous: 1.16 mmol/L (ref 0.5–1.9)

## 2017-08-16 LAB — BASIC METABOLIC PANEL
Anion gap: 12 (ref 5–15)
BUN: 14 mg/dL (ref 6–20)
CALCIUM: 9.2 mg/dL (ref 8.9–10.3)
CO2: 24 mmol/L (ref 22–32)
Chloride: 99 mmol/L (ref 98–111)
Creatinine, Ser: 1.17 mg/dL (ref 0.61–1.24)
GFR calc Af Amer: >60 mL/min (ref >60)
GLUCOSE: 113 mg/dL — AB (ref 70–99)
Potassium: 3.9 mmol/L (ref 3.5–5.1)
SODIUM: 135 mmol/L (ref 135–145)

## 2017-08-16 LAB — HEPATIC FUNCTION PANEL
ALT: 15 U/L (ref 0–44)
AST: 19 U/L (ref 15–41)
Albumin: 4.2 g/dL (ref 3.5–5.0)
Alkaline Phosphatase: 68 U/L (ref 38–126)
BILIRUBIN DIRECT: 0.2 mg/dL (ref 0.0–0.2)
BILIRUBIN INDIRECT: 0.8 mg/dL (ref 0.3–0.9)
TOTAL PROTEIN: 7.5 g/dL (ref 6.5–8.1)
Total Bilirubin: 1 mg/dL (ref 0.3–1.2)

## 2017-08-16 LAB — LIPASE, BLOOD: LIPASE: 23 U/L (ref 11–51)

## 2017-08-16 MED ORDER — HYDROMORPHONE HCL 1 MG/ML IJ SOLN
1.0000 mg | Freq: Once | INTRAMUSCULAR | Status: AC
  Administered 2017-08-16: 1 mg via INTRAVENOUS
  Filled 2017-08-16: qty 1

## 2017-08-16 MED ORDER — SODIUM CHLORIDE 0.9 % IV BOLUS
1000.0000 mL | Freq: Once | INTRAVENOUS | Status: AC
  Administered 2017-08-16: 1000 mL via INTRAVENOUS

## 2017-08-16 MED ORDER — ENOXAPARIN SODIUM 40 MG/0.4ML ~~LOC~~ SOLN
40.0000 mg | SUBCUTANEOUS | Status: DC
  Filled 2017-08-16: qty 0.4

## 2017-08-16 MED ORDER — DOXYCYCLINE HYCLATE 100 MG PO TABS
100.0000 mg | ORAL_TABLET | Freq: Two times a day (BID) | ORAL | Status: DC
  Administered 2017-08-16 – 2017-08-18 (×4): 100 mg via ORAL
  Filled 2017-08-16 (×4): qty 1

## 2017-08-16 MED ORDER — CEFTRIAXONE SODIUM 1 G IJ SOLR
1.0000 g | Freq: Once | INTRAMUSCULAR | Status: AC
  Administered 2017-08-17: 1 g via INTRAVENOUS
  Filled 2017-08-16 (×2): qty 10

## 2017-08-16 MED ORDER — IOHEXOL 300 MG/ML  SOLN
100.0000 mL | Freq: Once | INTRAMUSCULAR | Status: AC | PRN
  Administered 2017-08-16: 100 mL via INTRAVENOUS

## 2017-08-16 MED ORDER — ONDANSETRON HCL 4 MG/2ML IJ SOLN
4.0000 mg | Freq: Once | INTRAMUSCULAR | Status: AC
  Administered 2017-08-16: 4 mg via INTRAVENOUS
  Filled 2017-08-16: qty 2

## 2017-08-16 MED ORDER — HYDROCODONE-ACETAMINOPHEN 5-325 MG PO TABS
1.0000 | ORAL_TABLET | ORAL | Status: DC | PRN
  Administered 2017-08-16 – 2017-08-17 (×4): 2 via ORAL
  Filled 2017-08-16 (×4): qty 2

## 2017-08-16 NOTE — ED Notes (Signed)
Patient transported to Ultrasound 

## 2017-08-16 NOTE — ED Notes (Signed)
Patient transported to CT 

## 2017-08-16 NOTE — ED Notes (Signed)
Patient ambulatory to bathroom with steady gait at this time 

## 2017-08-16 NOTE — ED Notes (Signed)
Attempted report 

## 2017-08-16 NOTE — ED Notes (Signed)
Admitting team at bedside.

## 2017-08-16 NOTE — ED Triage Notes (Addendum)
Pt c/o right sided groin pain/swelling. Per pt, 3 weeks ago he pulled his groin muscle. Per pt, his pain and swelling went away, but returned 3-4 days ago and now radiates up into his his flank area on the right side. Pt endorses n/v/d.

## 2017-08-16 NOTE — ED Notes (Signed)
Got patient into a gown patient is resting with call bell in reach 

## 2017-08-16 NOTE — ED Provider Notes (Signed)
[facility] EMERGENCY DEPARTMENT Provider Note   CSN: [id number] Arrival date & time: 08/16/17  4098     History   Chief Complaint Chief Complaint  Patient presents with  . Groin Swelling    HPI Riley Simmons is a 22 y.o. male.  The history is provided by the patient and medical records. No language interpreter was used.  Abdominal Pain   This is a new problem. The current episode started more than 2 days ago. The problem occurs constantly. The problem has been gradually worsening. The pain is associated with an unknown factor. The pain is located in the RLQ, RUQ, suprapubic region and perineum. The quality of the pain is dull, sharp and aching. The pain is at a severity of 10/10. The pain is severe. Associated symptoms include diarrhea, nausea, vomiting and dysuria. Pertinent negatives include fever, constipation, frequency, hematuria and headaches. The symptoms are aggravated by palpation. Nothing relieves the symptoms.    Past Medical History:  Diagnosis Date  . Marijuana smoker, continuous   . Scoliosis   . Spina bifida ([place])   . Urethritis     There are no active problems to display for this patient.   History reviewed. No pertinent surgical history.      Home Medications    Prior to Admission medications   Medication Sig Start Date End Date Taking? Authorizing Provider  Catheters KIT Coloplast 12 french Provide 30 self catheterization kits for PRN catheterization Patient not taking: Reported on 07/22/2017 07/31/16   Pisciotta, Elmyra Ricks, PA-C  HYDROcodone-acetaminophen (NORCO/VICODIN) 5-325 MG tablet Take 1-2 tablets by mouth every 4 (four) hours as needed. 07/23/17   Charlann Lange, PA-C  ondansetron (ZOFRAN ODT) 4 MG disintegrating tablet Take 1 tablet (4 mg total) by mouth every 8 (eight) hours as needed for nausea or vomiting. Patient not taking: Reported on 06/15/2017 05/11/17   Horton, Barbette Hair, MD  oxybutynin (DITROPAN XL) 10 MG 24 hr tablet  Take 1 tablet (10 mg total) by mouth at bedtime. Patient not taking: Reported on 11/09/2015 06/05/15   Muthersbaugh, Jarrett Soho, PA-C  oxyCODONE-acetaminophen (PERCOCET/ROXICET) 5-325 MG tablet Take 1-2 tablets by mouth every 6 (six) hours as needed for severe pain. Patient not taking: Reported on 05/11/2017 11/09/15   Horton, Barbette Hair, MD  phenazopyridine (PYRIDIUM) 200 MG tablet Take 1 tablet (200 mg total) by mouth 3 (three) times daily. Patient not taking: Reported on 05/11/2017 07/22/16   Hedges, Dellis Filbert, PA-C  phenazopyridine (PYRIDIUM) 95 MG tablet Take 1 tablet (95 mg total) by mouth 3 (three) times daily as needed for pain. Patient not taking: Reported on 05/11/2017 11/09/16   Virgel Manifold, MD    Family History No family history on file.  Social History Social History   Tobacco Use  . Smoking status: Current Some Day Smoker  . Smokeless tobacco: Never Used  Substance Use Topics  . Alcohol use: Yes  . Drug use: Yes    Types: Marijuana     Allergies   Levofloxacin; Amoxicillin; and Latex   Review of Systems Review of Systems  Constitutional: Negative for chills, fatigue and fever.  HENT: Negative for congestion.   Respiratory: Negative for cough, chest tightness and shortness of breath.   Cardiovascular: Negative for chest pain.  Gastrointestinal: Positive for abdominal pain, diarrhea, nausea and vomiting. Negative for abdominal distention and constipation.  Genitourinary: Positive for dysuria, flank pain, scrotal swelling and testicular pain. Negative for decreased urine volume, discharge, frequency, hematuria, penile pain and urgency.  Musculoskeletal: Negative for back pain, neck pain and neck stiffness.  Neurological: Negative for light-headedness, numbness and headaches.  Psychiatric/Behavioral: Negative for agitation.  All other systems reviewed and are negative.    Physical Exam Updated Vital Signs BP (!) 144/96 (BP Location: Right Arm)   Pulse 92   Temp 98.9 F  (37.2 C) (Oral)   Resp 20   Ht '5\' 5"'  (1.651 m)   Wt 60.8 kg (134 lb)   SpO2 99%   BMI 22.30 kg/m   Physical Exam  Constitutional: He is oriented to person, place, and time. He appears well-developed and well-nourished. No distress.  HENT:  Head: Normocephalic and atraumatic.  Mouth/Throat: Oropharynx is clear and moist. No oropharyngeal exudate.  Eyes: Pupils are equal, round, and reactive to light. Conjunctivae are normal.  Neck: Normal range of motion. Neck supple.  Cardiovascular: Normal rate and regular rhythm.  No murmur heard. Pulmonary/Chest: Effort normal and breath sounds normal. No respiratory distress. He has no wheezes. He has no rales.  Abdominal: Soft. Normal appearance and bowel sounds are normal. He exhibits no mass. There is tenderness in the right lower quadrant. There is CVA tenderness. There is no rigidity. No hernia. Hernia confirmed negative in the right inguinal area and confirmed negative in the left inguinal area.    Genitourinary: Penis normal. Right testis shows swelling and tenderness. Right testis is descended. Cremasteric reflex is not absent on the right side. Left testis shows no swelling and no tenderness. Left testis is descended. Cremasteric reflex is not absent on the left side. Circumcised.     Musculoskeletal: He exhibits tenderness. He exhibits no edema.       Thoracic back: He exhibits tenderness and pain.       Back:  Neurological: He is alert and oriented to person, place, and time. No sensory deficit. He exhibits normal muscle tone.  Skin: Skin is warm and dry. Capillary refill takes less than 2 seconds. He is not diaphoretic.  Psychiatric: He has a normal mood and affect.  Nursing note and vitals reviewed.    ED Treatments / Results  Labs (all labs ordered are listed, but only abnormal results are displayed) Labs Reviewed  BASIC METABOLIC PANEL - Abnormal; Notable for the following components:      Result Value   Glucose, Bld 113  (*)    All other components within normal limits  CBC WITH DIFFERENTIAL/PLATELET - Abnormal; Notable for the following components:   WBC 23.4 (*)    Neutro Abs 20.3 (*)    Monocytes Absolute 1.7 (*)    Abs Immature Granulocytes 0.2 (*)    All other components within normal limits  URINALYSIS, ROUTINE W REFLEX MICROSCOPIC - Abnormal; Notable for the following components:   Ketones, ur 20 (*)    Protein, ur 30 (*)    All other components within normal limits  CULTURE, BLOOD (ROUTINE X 2)  CULTURE, BLOOD (ROUTINE X 2)  URINE CULTURE  LIPASE, BLOOD  HEPATIC FUNCTION PANEL  I-STAT CG4 LACTIC ACID, ED  I-STAT CG4 LACTIC ACID, ED    EKG None  Radiology Ct Abdomen Pelvis W Contrast  Result Date: 08/16/2017 CLINICAL DATA:  Right flank and right lower quadrant abdominal pain for 3 days. EXAM: CT ABDOMEN AND PELVIS WITH CONTRAST TECHNIQUE: Multidetector CT imaging of the abdomen and pelvis was performed using the standard protocol following bolus administration of intravenous contrast. CONTRAST:  100 ml OMNIPAQUE IOHEXOL 300 MG/ML  SOLN COMPARISON:  CT abdomen and pelvis  05/13/2017. FINDINGS: Lower chest: The lung bases are clear. No pleural or pericardial effusion. Hepatobiliary: No focal liver abnormality is seen. No gallstones, gallbladder wall thickening, or biliary dilatation. Pancreas: Unremarkable. No pancreatic ductal dilatation or surrounding inflammatory changes. Spleen: Measures 15.7 cm craniocaudal.  No focal lesion. Adrenals/Urinary Tract: Extensive left renal scarring centered over the calices is most suggestive chronic reflux disease. Compensatory hypertrophy of the right kidney is identified. Urinary bladder is unremarkable. Stomach/Bowel: Stomach is within normal limits. Appendix appears normal. No evidence of bowel wall thickening, distention, or inflammatory changes. Vascular/Lymphatic: No significant vascular findings are present. No enlarged abdominal or pelvic lymph nodes.  Reproductive: Prostate is unremarkable. Other: There is a small volume of free pelvic fluid. Musculoskeletal: No acute abnormality is identified. The patient has scattered Schmorl's nodes. The patient is status post L5-S1 laminectomy versus spinal dysraphism. Dysplasia of lower left ribs is identified. IMPRESSION: No source of infection is identified. The examination is negative for appendicitis. Small volume of free pelvic fluid is nonspecific but could be due to gastroenteritis. Extensive scarring in the left kidney has an appearance most suggestive of chronic vesicoureteral reflux. Unchanged splenomegaly since 2017. Spinal dysraphism versus postoperative change L5-S1. Postoperative change is thought less likely. Electronically Signed   By: Inge Rise M.D.   On: 08/16/2017 16:31   US Scrotum W/doppler  Result Date: 08/16/2017 CLINICAL DATA:  Right testicle pain and swelling. Epididymitis diagnosed on 07/22/2017. EXAM: SCROTAL ULTRASOUND DOPPLER ULTRASOUND OF THE TESTICLES TECHNIQUE: Complete ultrasound examination of the testicles, epididymis, and other scrotal structures was performed. Color and spectral Doppler ultrasound were also utilized to evaluate blood flow to the testicles. COMPARISON:  Ultrasound dated 07/22/2017 FINDINGS: Right testicle Measurements: 4.1 x 3.3 x 3.4 cm. No mass or microlithiasis visualized. Left testicle Measurements: 4.3 x 2.3 x 3.1 cm. No mass or microlithiasis visualized. Right epididymis: Enlarged hypervascular right epididymis, similar to the appearance on the prior exam. Left epididymis: 4 mm cysts in the otherwise normal appearing left epididymis Hydrocele: Small right hydrocele, slightly increased since the prior study. Varicocele:  None visualized. Pulsed Doppler interrogation of both testes demonstrates normal low resistance arterial and venous waveforms bilaterally. IMPRESSION: Recurrent right epididymitis.  Increased small right hydrocele. Electronically Signed    By: Lorriane Shire M.D.   On: 08/16/2017 11:34    Procedures Procedures (including critical care time)  Medications Ordered in ED Medications  cefTRIAXone (ROCEPHIN) 1 g in sodium chloride 0.9 % 100 mL IVPB (has no administration in time range)  ondansetron (ZOFRAN) injection 4 mg (4 mg Intravenous Given 08/16/17 1050)  sodium chloride 0.9 % bolus 1,000 mL (0 mLs Intravenous Stopped 08/16/17 1128)  HYDROmorphone (DILAUDID) injection 1 mg (1 mg Intravenous Given 08/16/17 1050)  HYDROmorphone (DILAUDID) injection 1 mg (1 mg Intravenous Given 08/16/17 1240)  HYDROmorphone (DILAUDID) injection 1 mg (1 mg Intravenous Given 08/16/17 1447)  sodium chloride 0.9 % bolus 1,000 mL (0 mLs Intravenous Stopped 08/16/17 1557)  iohexol (OMNIPAQUE) 300 MG/ML solution 100 mL (100 mLs Intravenous Contrast Given 08/16/17 1557)  HYDROmorphone (DILAUDID) injection 1 mg (1 mg Intravenous Given 08/16/17 1701)     Initial Impression / Assessment and Plan / ED Course  I have reviewed the triage vital signs and the nursing notes.  Pertinent labs & imaging results that were available during my care of the patient were reviewed by me and considered in my medical decision making (see chart for details).     Riley Simmons is a 22 y.o. 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  Patient reports that he was diagnosed with multiple episodes of urinary tract infection and pyelonephritis over the last few months.  He also was diagnosed with epididymitis earlier this month.  He reports that he took the antibiotics as directed but over the last 3 days has had recurrence and worsening of his right groin pain.  He reports that this is the worst  is ever been.  He reports it is swollen and red in the right hemidiaphragm.  He has dysuria and severe right flank pain and right hemiabdomen pain.  He described the pain is up to 10 out of 10 severity in the groin and side.  He reports nausea and vomiting.  He reports he recently had diarrhea.  He reports he had shaking fevers and chills yesterday but did not take his temperature.  He denies recent trauma.  He denies other complaints on arrival.  On exam, patient's right abdomen in the right upper and right lower quadrant are tender to palpation.  Right flank and CVA area very tender to palpation.  Right inguinal area is tender however I did not palpate a large hernia.  Right groin was extremely swollen erythematous and warm.  It was very tender.  Penis was unremarkable.  Lungs were clear and abdominal bowel sounds were appreciated.  Next  Clinically I am concerned about recurrent torsion versus pyelonephritis, worsened epididymitis, or other infection.  Hernia also considered difficult to palpate due to the swelling and tenderness.  Next  Patient will have ultrasound repeated as well as screen laboratory testing.  Patient was given pain medicine nausea medicine and fluids during initial work-up.  Patient was made n.p.o.  Anticipate reassessment after work-up.  3:08 PM Patient's diagnostic work-up began to return.  Patient's ultrasound of the testicle showed enlarged hydrocele but no evidence of hernia.  There was also evidence of recurrent epididymitis. No torsion.  Patient's laboratory testing showed sterile pyuria in the urine with white blood cells and no bacteria nitrites or leukocytes.  Lactic acid was normal x2.  Patient was found to have a large leukocytosis of 23.5.  This is elevated from prior.  Kidney function was not elevated and liver function was not elevated.  No anemia.  Lipase not elevated.  Next  On reassessment, patient continues to have severe tenderness in his right lower quadrant  and right flank.  Patient thinks he may have had a kidney stone in the past.  Although patient has had imaging several months ago, given his new symptoms, I cannot rule out appendicitis or a kidney stone causing obstruction.  Patient will given more pain medicine and have a CT scan to reevaluate.    CT scan shows no significant ab normalities, and patient's pain is improved, patient will likely be stable for discharge home for antibiotic treatment of recurrent epididymitis.  Patient will likely to follow-up with urology as well for the recurrent infections.   5:30 PM CT scan did not show evidence of diverticulitis, appendicitis, or kidney abnormality on the right.    Suspect recurrent epididymitis as cause of symptoms.    I spoke with pharmacy due to recurrent nature of his symptoms after multiple episodes of pyelonephritis and UTIs as  well as epididymitis.  Pharmacy recommended IV Rocephin if the patient needs to be admitted and I am Rocephin and doxycycline patient was stable for discharge.    I discussed with patient he was still having severe pain and not managing his discomfort even with Dilaudid.  Due to the patient's complicated infection with the recurrent epididymitis, his history of self cathing with spina bifida, and the recurrent infections after having been on multiple medications and antibiotics, patient will be admitted for failure of outpatient management of epididymitis.    Unassigned medicine team will be called for admission.  Rocephin was ordered.   Final Clinical Impressions(s) / ED Diagnoses   Final diagnoses:  Epididymitis  Leukocytosis, unspecified type  Flank pain  Pain in right testicle    ED Discharge Orders    None     Clinical Impression: 1. Epididymitis   2. Leukocytosis, unspecified type   3. Flank pain   4. Pain in right testicle     Disposition: Admit  This note was prepared with assistance of Dragon voice recognition software. Occasional  wrong-word or sound-a-like substitutions may have occurred due to the inherent limitations of voice recognition software.     Tegeler, Gwenyth Allegra, MD 08/16/17 [phone]

## 2017-08-16 NOTE — ED Notes (Signed)
ED Provider at bedside. 

## 2017-08-16 NOTE — ED Notes (Signed)
Patient asked this nurse to let him go outside to his car to "get his debit card to pay his mortgage and say goodnight to his children" This RN explained to the patient that he may not leave with an IV in place and that if he were to leave, it would be considered an AMA discharge. The patient verbalized understanding.

## 2017-08-16 NOTE — H&P (Signed)
Date: 08/16/2017               Patient Name:  Riley Simmons MRN: [id number]  DOB: 10/28/1995 Age / Sex: 22 y.o., male   PCP: Patient, No Pcp Per         Medical Service: Internal Medicine Teaching Service         Attending Physician: Dr. Arby BarrettePfeiffer, Marcy, MD    First Contact: Dr. Johnsie CancelAgye Pager: [phone]  Second Contact: Dr. Samuella CotaSvalina Pager: [phone]       After Hours (After 5p/  First Contact Pager: [phone]  weekends / holidays): Second Contact Pager: [phone]   Chief Complaint: Scrotal pain  History of Present Illness: The patient is a 22 year old male with a history of spina bifida, scoliosis, and neurogenic bladder presenting with testicular pain and swelling for the last 2 days. He came to the ER on June 7 for similar issues had an ultrasound that showed epididymitis and a UTI, he was given bactrim, GC/ chlamydia urine probe was negative. His urine grew E/ coli which bactrim is sensitive to. He returned on June 13 with complaints of diarrhea, he did not have any testicular symptoms at that time and his U/A was negative so bactrim was discontinued. 2 days ago he started to notice swelling of his right testicle and it became painful. It is a dull pain, and is worse with movement and touch. He denies any discharge. It is associated with subjective fever, constipation, nausea and vomiting. His girlfriend is present at the exam, and he states he is sexually active, with 1 male partner.  He does self catheterizations due to his spina bifida and has recurrent UTIs and kidney infections over the last few months.   In the ER his vitals were WNL, he had a scrotal U/S that showed an enlarged hydrocele and recurrent right epididymitis. His WBC count was 23 and his U/A had elevated ketones and protein. His kidney function and liver function was normal.  Meds:  Current Meds  Medication Sig  . acetaminophen (TYLENOL) 500 MG tablet Take 500 mg by mouth every 6 (six) hours as needed for mild pain or  headache.  . ibuprofen (ADVIL,MOTRIN) 200 MG tablet Take 800 mg by mouth every 6 (six) hours as needed for moderate pain.     Allergies: Allergies as of 08/16/2017 - Review Complete 08/16/2017  Allergen Reaction Noted  . Levofloxacin Anxiety, Hives, and Swelling 02/18/2015  . Amoxicillin Hives and Rash 11/29/2014  . Latex Rash 12/04/2012   Past Medical History:  Diagnosis Date  . Marijuana smoker, continuous   . Scoliosis   . Spina bifida ([facility])   . Urethritis     Family History: DM and depression runs in the family. History of lung cancer in his maternal grandmother.   Social History: Smokes 1/2 ppd, smokes marijuana, denies drug and EtOH use. Sexually active with 1 male partner.  Review of Systems: A complete ROS was negative except as per HPI.  Review of Systems  Constitutional: Positive for chills and fever. Negative for malaise/fatigue and weight loss.  Respiratory: Negative.   Cardiovascular: Negative.   Gastrointestinal: Positive for constipation, nausea and vomiting.    Physical Exam: Blood pressure (!) 130/93, pulse 96, temperature 98.9 F (37.2 C), temperature source Oral, resp. rate 16, height 5\' 5"  (1.651 m), weight 134 lb (60.8 kg), SpO2 100 %. Physical Exam  Constitutional: He is oriented to person, place, and time. He appears distressed.  HENT:  Head: Normocephalic and atraumatic.  Eyes: Pupils are equal, round, and reactive to light. EOM are normal.  Cardiovascular: Normal rate, regular rhythm and normal heart sounds.  Pulmonary/Chest: Effort normal and breath sounds normal.  Abdominal: Soft. Bowel sounds are normal.  Genitourinary: No discharge found.  Genitourinary Comments: Right testicle was erythematous, swollen, and tender to touch.   Neurological: He is alert and oriented to person, place, and time.  Skin: Skin is warm and dry.     Assessment & Plan by Problem:  This is a 22 year old male with a PMH of spina bifida, neurogenic bladder  requiring self catheterization, and frequent UTIs presenting with 2 days of scrotal pain and swelling. Recently completed a course of bactrim for E. Coli UTI and possible epididymitis, GC/Chlamydia urine probe at that time was negative. Work up this admission with scrotal ultrasound concerning for recurrent epididymitis.   Epididymitis: Patient was admitted for scrotal pain and swelling for the past 2 days associated with nausea, vomiting, subjective fever, and constipation. He had recently been diagnosed with epididymitis and was not able to complete antibiotic therapy. His labs showed an elevated WBC and imaging showed findings for epididymitis. He is sexually active and under the age of 69 making a STI a possible cause. He is admitted for treatment for epididymitis.  - Start on PO doxycycline 100 mh BID for 10 days and IV ceftriaxone x1 - Hydrocodone-acetaminophen for pain - Daily CBC to trend WBC - Gc/Chlamydia probe - Scrotal support for pain  FEN: No fluids, replete lytes prn, regular diet  VTE ppx: Lovenox Code Status: FULL   Dispo: Admit patient to Observation with expected length of stay less than 2 midnights.  Signed: Claudean Severance, MD 08/16/2017, 7:18 PM  Pager: [phone]

## 2017-08-17 DIAGNOSIS — N433 Hydrocele, unspecified: Secondary | ICD-10-CM

## 2017-08-17 DIAGNOSIS — D72829 Elevated white blood cell count, unspecified: Secondary | ICD-10-CM

## 2017-08-17 DIAGNOSIS — N50811 Right testicular pain: Secondary | ICD-10-CM

## 2017-08-17 LAB — CBC
HCT: 47.3 % (ref 39.0–52.0)
HEMOGLOBIN: 14.5 g/dL (ref 13.0–17.0)
MCH: 30 pg (ref 26.0–34.0)
MCHC: 30.7 g/dL (ref 30.0–36.0)
MCV: 97.9 fL (ref 78.0–100.0)
Platelets: 220 10*3/uL (ref 150–400)
RBC: 4.83 MIL/uL (ref 4.22–5.81)
RDW: 13.4 % (ref 11.5–15.5)
WBC: 20 10*3/uL — AB (ref 4.0–10.5)

## 2017-08-17 LAB — URINE CULTURE: Culture: NO GROWTH

## 2017-08-17 LAB — HIV ANTIBODY (ROUTINE TESTING W REFLEX): HIV SCREEN 4TH GENERATION: NONREACTIVE

## 2017-08-17 LAB — GC/CHLAMYDIA PROBE AMP (~~LOC~~) NOT AT ARMC
CHLAMYDIA, DNA PROBE: NEGATIVE
NEISSERIA GONORRHEA: NEGATIVE

## 2017-08-17 MED ORDER — OXYCODONE-ACETAMINOPHEN 5-325 MG PO TABS
1.0000 | ORAL_TABLET | Freq: Four times a day (QID) | ORAL | Status: DC | PRN
Start: 2017-08-17 — End: 2017-08-18
  Administered 2017-08-17 – 2017-08-18 (×4): 1 via ORAL
  Filled 2017-08-17 (×4): qty 1

## 2017-08-17 MED ORDER — SODIUM CHLORIDE 0.9 % IV SOLN
INTRAVENOUS | Status: DC
Start: 2017-08-17 — End: 2017-08-18
  Administered 2017-08-17: 01:00:00 via INTRAVENOUS

## 2017-08-17 MED ORDER — OXYCODONE HCL 5 MG PO TABS
5.0000 mg | ORAL_TABLET | Freq: Four times a day (QID) | ORAL | Status: DC | PRN
  Administered 2017-08-17 – 2017-08-18 (×4): 5 mg via ORAL
  Filled 2017-08-17 (×4): qty 1

## 2017-08-17 MED ORDER — HYDROMORPHONE HCL 1 MG/ML IJ SOLN: 1.0000 mg | INTRAMUSCULAR | Status: DC | PRN

## 2017-08-17 MED ORDER — ADENOSINE 6 MG/2ML IV SOLN: 6.0000 mg | Freq: Once | INTRAVENOUS | Status: DC

## 2017-08-17 MED ORDER — ONDANSETRON HCL 4 MG PO TABS
8.0000 mg | ORAL_TABLET | Freq: Three times a day (TID) | ORAL | Status: DC | PRN
  Administered 2017-08-17 – 2017-08-18 (×3): 8 mg via ORAL
  Filled 2017-08-17 (×3): qty 2

## 2017-08-17 MED ORDER — FENTANYL CITRATE (PF) 100 MCG/2ML IJ SOLN: 50.0000 ug | Freq: Once | INTRAMUSCULAR | Status: DC

## 2017-08-17 MED ORDER — ONDANSETRON HCL 4 MG/2ML IJ SOLN: 4.0000 mg | Freq: Three times a day (TID) | INTRAMUSCULAR | Status: DC | PRN

## 2017-08-17 MED ORDER — MIDAZOLAM HCL 2 MG/2ML IJ SOLN: 1.0000 mg | Freq: Once | INTRAMUSCULAR | Status: DC

## 2017-08-17 MED ORDER — ACETAMINOPHEN 325 MG PO TABS
650.0000 mg | ORAL_TABLET | Freq: Four times a day (QID) | ORAL | Status: DC | PRN
Start: 2017-08-17 — End: 2017-08-18
  Administered 2017-08-18: 650 mg via ORAL
  Filled 2017-08-17 (×2): qty 2

## 2017-08-17 NOTE — Progress Notes (Addendum)
   Subjective: Hospital Day 1  Overnight: Afebrile. Patient reports of severe scrotal pain. Rates pain at 10/10 at its worse. He received 2 doses of Vicodin 5-325mg  with no relief.   Today, Mr. Delford FieldWright was lying in bed confortably. He continued to endorse scrotal pain but reported improvement from previous episodes. He also reported 1 episode of non-billous, non-bloody vomit. Admits to nausea, chills but denies penile discharge, chest pain, palpitation, shortness of breath.   Objective:  Vital signs in last 24 hours: Vitals:   08/16/17 1930 08/16/17 2020 08/17/17 0531 08/17/17 0900  BP:  (!) 142/91 133/87 130/80  Pulse: 98 94 94 89  Resp:  18 16 18   Temp:  98.2 F (36.8 C) 98.7 F (37.1 C) 98.6 F (37 C)  TempSrc:      SpO2: 100% 100% 100% 100%  Weight:  134 lb 0.1 oz (60.8 kg)    Height:  5\' 5"  (1.651 m)     Physical Exams:  Constitutional: In mild acute distress secondary to scrotal pain  HEENT: Atraumatic, normocephalic CV: RRR, no murmurs, gallops, rubs Resp: CTABL, no wheezes, crackles, rhonchi Groin: Right scrotal swelling, erythematous, warm to touch, tender to palpation  Assessment/Plan:  Active Problems:   Epididymitis  Mr. Delford FieldWright is a 22 year old male with spina bifida that requires self craterization, recurrent UTIs who presented to the ED on 08/16/17 with complaints of scrotal pain and swelling.   Epididymitis:  He presented to the ED on 08/16/17 with a 2 day history of scrotal pain and swelling. Initial labs in the ED revealed leukocytosis of 23.4 but afebrile. Scrotal U/S confirmed recurrent right epididymitis with small right hydrocele.  -WBC downtrending. (20 <--23.4). Will continue to monitor  - UA negative for leukocytes and nitrates - Blood and Urine Culture NGTD - Completed 1 dose of IV Ceftriaxone 1g  - Started PO Doxycycline 100mg  for duration of 10 days (08/16/17 to 08/26/17) - Scrotal support for pain - Pain regimen: Dilaudid 1mg  Q3hrs prn for  breakthrough pain , Oxycodone 10-325mg  PO Q6 prn  Spina Bifida with neurogenic bladder: - Patient requires self craterization - CT Abdomen and Pelvis:  *Spinal dysraphism versus postoperative change L5-S1. Postoperative change    is thought less likely.    Nausea: - Zofran 4mg  OR 8mg  PRN    F: None needed  E: Replace electrolytes as needed N: Regular Diet VTE ppx: Lovenox 40mg  SQ Code Status: FULL   Dispo: Anticipated discharge in approximately 1-2 days.  Yvette RackAgyei, Obed K, MD 08/17/2017, 1:42 PM Pager: [phone]

## 2017-08-17 NOTE — Care Management Note (Addendum)
Case Management Note  Patient Details  Name: Serafina Mitchellickolas L Mceachin MRN: [id number] Date of Birth: 11-14-1995  Subjective/Objective:               Spoke to patient at bedside. He states that he does not have a PCP, has only had to renew his Rx for self cath supplies "one time since being an adult," [facility]. He does not have a PCP, and his plan was to keep coming back to ED for medical care and [facility]. CM [facility] him w [facility], this gives him the ability to have PCP and use [facility] low cost pharmacy. Provided w MATCH. Will continue to follow.       Action/Plan:   Expected Discharge Date:                  Expected Discharge Plan:  Home/Self Care  In-House Referral:     Discharge planning Services  CM Consult, Follow-up appt scheduled, [facility]igent [facility], Medication Assistance  Post Acute Care Choice:    Choice offered to:     DME Arranged:    DME Agency:     HH Arranged:    HH Agency:     Status of Service:  In process, will continue to follow  If discussed at Long Length of Stay Meetings, dates discussed:    Additional Comments:  Lawerance SabalDebbie Catherine Cubero, RN 08/17/2017, 10:34 AM

## 2017-08-17 NOTE — Progress Notes (Signed)
New Admission Note:  Arrival Method: Via stretcher from ED Mental Orientation: Alert & Oriented x4 Telemetry: N/A Assessment: Completed Skin: Refer to flowsheet IV: Left AC Pain: 10/10 Safety Measures: Safety Fall Prevention Plan discussed with patient. Admission: Completed 5 [place] Orientation: Patient has been orientated to the room, unit and the staff. Family: Girl-friend at bedside  Orders have been reviewed and are being implemented. Will continue to monitor the patient. Call light has been placed within reach.   Aram CandelaJequetta Cayenne Breault, RN  Phone Number: [phone]

## 2017-08-17 NOTE — Discharge Summary (Signed)
Name: Riley Simmons MRN: [id number] DOB: 1995/03/04 22 y.o. PCP: Patient, No Pcp Per  Date of Admission: 08/16/2017  9:18 AM Date of Discharge: 7 Attending Physician: No att. providers found  Discharge Diagnosis: 1. Epididymitis with leukocytosis   Discharge Medications: Allergies as of 08/18/2017      Reactions   Levofloxacin Anxiety, Hives, Swelling   Amoxicillin Hives, Rash   Latex Rash      Medication List    STOP taking these medications   HYDROcodone-acetaminophen 5-325 MG tablet Commonly known as:  NORCO/VICODIN   oxyCODONE-acetaminophen 5-325 MG tablet Commonly known as:  PERCOCET/ROXICET Replaced by:  oxyCODONE-acetaminophen 10-325 MG tablet     TAKE these medications   acetaminophen 500 MG tablet Commonly known as:  TYLENOL Take 500 mg by mouth every 6 (six) hours as needed for mild pain or headache.   Catheters Kit Coloplast 12 french Provide 30 self catheterization kits for PRN catheterization   doxycycline 100 MG tablet Commonly known as:  VIBRA-TABS Take 1 tablet (100 mg total) by mouth every 12 (twelve) hours for 10 days.   ibuprofen 200 MG tablet Commonly known as:  ADVIL,MOTRIN Take 800 mg by mouth every 6 (six) hours as needed for moderate pain.   ondansetron 4 MG disintegrating tablet Commonly known as:  ZOFRAN ODT Take 1 tablet (4 mg total) by mouth every 8 (eight) hours as needed for nausea or vomiting.   oxybutynin 10 MG 24 hr tablet Commonly known as:  DITROPAN XL Take 1 tablet (10 mg total) by mouth at bedtime.   oxyCODONE-acetaminophen 10-325 MG tablet Commonly known as:  PERCOCET Take 1 tablet by mouth every 6 (six) hours as needed for pain. Replaces:  oxyCODONE-acetaminophen 5-325 MG tablet   phenazopyridine 200 MG tablet Commonly known as:  PYRIDIUM Take 1 tablet (200 mg total) by mouth 3 (three) times daily.   phenazopyridine 95 MG tablet Commonly known as:  PYRIDIUM Take 1 tablet (95 mg total) by mouth 3 (three) times  daily as needed for pain.       Disposition and follow-up:   Mr.Riley Simmons was discharged from [facility] in good condition.  At the hospital follow up visit please address:  1.  Epididymitis - Patient will complete 10 day course of Doxycycline 113m BID. Initially denied           getting plugged in with PCP due to status of being uninsured but later on agreed to working with       Case management.   2.  Labs / imaging needed at time of follow-up: none  3.  Pending labs/ test needing follow-up: none  Follow-up Appointments: Follow-up [facility] on 09/10/2017.   Why:  at 09:20 am. Please arrive 10 miutes early to fill out paperwork. This will be to establish a primary care provider. You may continue to use [facility] long as you keep your appointment here.  Contact information: [id number] Follow up.   Why:  You may use the low cost pharmacy at this location at discharge. You will be able to continue to use it as long as you are an active patient at the [facility] or this office.  Contact information: [address]  [address] [phone]          Hospital Course by problem list: 1. Epididymitis   [name] is a 22 year old male with PMHx significant for spina bifida requiring self craterization and recurrent UTIs who presented to the ED on 08/16/17 with complaints of scrotal pain and swelling.  He was in his usual state of health until 2 days prior to admission when he began experiencing scrotal pain and swelling. Initial labs in the ED revealed leukocytosis of 23.4 but had remained afebrile. Scrotal U/S confirmed recurrent right epididymitis with small right hydrocele. His urinalysis was negative for  leukocytes and nitrates, GC/Chlamydia was negative.. Blood and Urine Culture no growth to date.   During admission, patient completed 1 dose of IV Ceftriaxone 1g and was started on PO Doxycycline 136m to complete a total duration of 10 days (08/16/17 to 08/26/17). Leukocytosis resolved 2 days after starting Doxycycline. His symptoms also improved as noted subjectively and objectively in labs. He reported improvement in testicular pain, swelling and denied fevers or chills.    2. Spina Bifida with neurogenic bladder: Mr. WPutzierhas a longstanding history of neurogenic bladder requiring self craterization. During his admission, we utilized appropriate craterization protocol.     Discharge Vitals:   BP 133/81 (BP Location: Right Arm)   Pulse 79   Temp 98.3 F (36.8 C) (Oral)   Resp 18   Ht '5\' 5"'  (1.651 m)   Wt 134 lb 1 oz (60.8 kg)   SpO2 99%   BMI 22.31 kg/m   Pertinent Labs, Studies, and Procedures:  Scrotal Ultrasound performed on 08/16/17  IMPRESSION: - Recurrent right epididymitis.  Increased small right hydrocele.  Discharge Instructions:  Discharge Instructions    Activity as tolerated - No restrictions   Complete by:  As directed    Call MD for:  difficulty breathing, headache or visual disturbances   Complete by:  As directed    Call MD for:  difficulty breathing, headache or visual disturbances   Complete by:  As directed    Call MD for:  extreme fatigue   Complete by:  As directed    Call MD for:  extreme fatigue   Complete by:  As directed    Call MD for:  hives   Complete by:  As directed    Call MD for:  hives   Complete by:  As directed    Call MD for:  persistant dizziness or light-headedness   Complete by:  As directed    Call MD for:  persistant dizziness or light-headedness   Complete by:  As directed    Call MD for:  persistant nausea and vomiting   Complete by:  As directed    Call MD for:  persistant nausea and vomiting   Complete by:  As directed      Call MD for:  redness, tenderness, or signs of infection (pain, swelling, redness, odor or green/yellow discharge around incision site)   Complete by:  As directed    Call MD for:  redness, tenderness, or signs of infection (pain, swelling, redness, odor or green/yellow discharge around incision site)   Complete by:  As directed    Call MD for:  severe uncontrolled pain   Complete by:  As directed    Call MD for:  severe uncontrolled pain   Complete by:  As directed    Call MD for:  temperature >100.4   Complete by:  As directed    Call  MD for:  temperature >100.4   Complete by:  As directed    Diet - low sodium heart healthy   Complete by:  As directed    Diet general   Complete by:  As directed    Discharge instructions   Complete by:  As directed    1) Epididymitis  - Finish 10 day course of Doxycycline 156m twice a day (08/16/17-08/26/17)   Discharge instructions   Complete by:  As directed    You were seen at the hospital for scrotal pain and swelling due to an infection   1) Continue doxycycline 1 pill twice a day until you finish the course   Increase activity slowly   Complete by:  As directed       Signed: AJean Rosenthal MD 08/18/2017, 4:05 PM   Pager: [phone]

## 2017-08-18 DIAGNOSIS — D72829 Elevated white blood cell count, unspecified: Secondary | ICD-10-CM

## 2017-08-18 LAB — CBC
HCT: 39.1 % (ref 39.0–52.0)
HEMOGLOBIN: 12.7 g/dL — AB (ref 13.0–17.0)
MCH: 30 pg (ref 26.0–34.0)
MCHC: 32.5 g/dL (ref 30.0–36.0)
MCV: 92.4 fL (ref 78.0–100.0)
PLATELETS: 216 10*3/uL (ref 150–400)
RBC: 4.23 MIL/uL (ref 4.22–5.81)
RDW: 13.2 % (ref 11.5–15.5)
WBC: 13 10*3/uL — ABNORMAL HIGH (ref 4.0–10.5)

## 2017-08-18 LAB — BASIC METABOLIC PANEL
Anion gap: 7 (ref 5–15)
BUN: 13 mg/dL (ref 6–20)
CHLORIDE: 102 mmol/L (ref 98–111)
CO2: 29 mmol/L (ref 22–32)
CREATININE: 1.19 mg/dL (ref 0.61–1.24)
Calcium: 8.5 mg/dL — ABNORMAL LOW (ref 8.9–10.3)
GFR calc Af Amer: >60 mL/min (ref >60)
Glucose, Bld: 104 mg/dL — ABNORMAL HIGH (ref 70–99)
Potassium: 3.9 mmol/L (ref 3.5–5.1)
Sodium: 138 mmol/L (ref 135–145)

## 2017-08-18 MED ORDER — DOXYCYCLINE HYCLATE 100 MG PO TABS: 100.0000 mg | ORAL_TABLET | Freq: Two times a day (BID) | ORAL | 0 refills | Status: AC

## 2017-08-18 MED ORDER — OXYCODONE-ACETAMINOPHEN 10-325 MG PO TABS: 1.0000 | ORAL_TABLET | Freq: Four times a day (QID) | ORAL | 0 refills | Status: DC | PRN

## 2017-08-18 NOTE — Progress Notes (Signed)
Patient discharged to home. Patient AVS reviewed and signed. Patient capable re-verbalizing medications and follow-up appointments. IV removed. Patient belongings sent with patient. Patient educated to return to the ED in the event of SOB, chest pain or dizziness.   Cordarrel Stiefel B. RN 

## 2017-08-18 NOTE — Progress Notes (Addendum)
   Subjective: Hospital day 2  Overnight: No acute events  Today, [name] reports that  He is feeling better. He only complains of minimal scrotal pain and requested additional scrotal support but denies fevers, chills, nausea, vomiting, shortness of breath, chest pain, palpitation. He also complained of constipation and requested yogurt. He states that he does not have insurance but had been in contact with case management.   Objective:  Vital signs in last 24 hours: Vitals:   08/17/17 0531 08/17/17 0900 08/17/17 1718 08/18/17 0540  BP: 133/87 130/80 140/83 135/75  Pulse: 94 89 90 85  Resp: 16 18 18 18   Temp: 98.7 F (37.1 C) 98.6 F (37 C) 98.7 F (37.1 C) 98.4 F (36.9 C)  TempSrc:   Oral Oral  SpO2: 100% 100% 100% 100%  Weight:    134 lb 1 oz (60.8 kg)  Height:       Physical Exams:  Constitutional: In no distress, lying in bed comfortably HEENT: Atraumatic, normocephalic Eyes: Non-icteric  CV: RRR, no murmurs, gallops, rubs Resp: CTABL, no wheezes, crackles, rhonchi Groin: Right scrotal swelling, erythema and warmth have IMPROVED, right testis mildly tender to palpation    Assessment/Plan:  Active Problems:   Epididymitis   Leukocytosis   Pain in right testicle  [name] is a 22 year old male with spina bifida that requires self craterization, recurrent UTIs who presented to the ED on 08/16/17 with complaints of scrotal pain and swelling.   Epididymitis, improving:  Patient presented to the ED on 08/16/17 with a 2 day history of scrotal pain and swelling. Initial labs in the ED revealed leukocytosis of 23.4 but afebrile. Scrotal U/S confirmed recurrent right epididymitis with small right hydrocele. Patient's symptoms have significantly improved with decrease in testicular swelling, warmth and erythema - Leukocytosis has resolved. Currently [13] from [20] on admission - Urine GC/Chlamydia was negative - UA negative for leukocytes and nitrates - Blood and Urine  Culture NGTD - Completed 1 dose of IV Ceftriaxone 1g  - Started PO Doxycycline 100mg  for duration of 10 days (08/16/17 to 08/26/17). Will continue upon discharge - Scrotal support for pain - Pain regimen: Dilaudid 1mg  Q3hrs prn for breakthrough pain , Oxycodone 10-325mg  PO Q6 prn  Spina Bifida with neurogenic bladder: - Patient requires self craterization - CT Abdomen and Pelvis:            *Spinal dysraphism versus postoperative change L5-S1. Postoperative change       is  thought less likely.   Nausea, resolved: - Zofran 4mg  OR 8mg  PRN   F: None needed  E: Replace electrolytes as needed N:Regular Diet VTE ppx: Lovenox 40mg  SQ Code Status: FULL  Dispo: Anticipated discharge in approximately 1-2 days. Planned discharge for today 08/18/2017   Yvette RackAgyei, Obed K, MD 08/18/2017, 7:24 AM Pager: [phone]

## 2017-08-21 LAB — CULTURE, BLOOD (ROUTINE X 2)
CULTURE: NO GROWTH
CULTURE: NO GROWTH
SPECIAL REQUESTS: ADEQUATE
Special Requests: ADEQUATE

## 2017-09-10 ENCOUNTER — Ambulatory Visit: Payer: Self-pay | Admitting: Family Medicine

## 2017-09-20 ENCOUNTER — Encounter: Payer: Self-pay | Admitting: Family Medicine

## 2017-09-20 ENCOUNTER — Ambulatory Visit (INDEPENDENT_AMBULATORY_CARE_PROVIDER_SITE_OTHER): Payer: Self-pay | Admitting: Family Medicine

## 2017-09-20 VITALS — BP 123/82 | HR 78 | Temp 98.4°F | Resp 16

## 2017-09-20 DIAGNOSIS — Q059 Spina bifida, unspecified: Secondary | ICD-10-CM | POA: Insufficient documentation

## 2017-09-20 DIAGNOSIS — N319 Neuromuscular dysfunction of bladder, unspecified: Secondary | ICD-10-CM | POA: Insufficient documentation

## 2017-09-20 DIAGNOSIS — D72829 Elevated white blood cell count, unspecified: Secondary | ICD-10-CM

## 2017-09-20 MED ORDER — OXYBUTYNIN CHLORIDE 5 MG PO TABS: 5.0000 mg | ORAL_TABLET | Freq: Two times a day (BID) | ORAL | 2 refills | Status: DC

## 2017-09-20 NOTE — Patient Instructions (Addendum)
I sent the medication (Ditropan) to your pharmacy. It is to be taken two times per day.   Oxybutynin tablets What is this medicine? OXYBUTYNIN (ox i BYOO ti nin) is used to treat overactive bladder. This medicine reduces the amount of bathroom visits. It may also help to control wetting accidents. This medicine may be used for other purposes; ask your health care provider or pharmacist if you have questions. COMMON BRAND NAME(S): Ditropan What should I tell my health care provider before I take this medicine? They need to know if you have any of these conditions: -autonomic neuropathy -dementia -difficulty passing urine -glaucoma -intestinal obstruction -kidney disease -liver disease -myasthenia gravis -Parkinson's disease -an unusual or allergic reaction to oxybutynin, other medicines, foods, dyes, or preservatives -pregnant or trying to get pregnant -breast-feeding How should I use this medicine? Take this medicine by mouth with a glass of water. Follow the directions on the prescription label. You can take this medicine with or without food. Take your medicine at regular intervals. Do not take your medicine more often than directed. Talk to your pediatrician regarding the use of this medicine in children. Special care may be needed. While this drug may be prescribed for children as young as 5 years for selected conditions, precautions do apply. Overdosage: If you think you have taken too much of this medicine contact a poison control center or emergency room at once. NOTE: This medicine is only for you. Do not share this medicine with others. What if I miss a dose? If you miss a dose, take it as soon as you can. If it is almost time for your next dose, take only that dose. Do not take double or extra doses. What may interact with this medicine? -antihistamines for allergy, cough and cold -atropine -certain medicines for bladder problems like oxybutynin, tolterodine -certain  medicines for Parkinson's disease like benztropine, trihexyphenidyl -certain medicines for stomach problems like dicyclomine, hyoscyamine -certain medicines for travel sickness like scopolamine -clarithromycin -erythromycin -ipratropium -medicines for fungal infections, like fluconazole, itraconazole, ketoconazole or voriconazole This list may not describe all possible interactions. Give your health care provider a list of all the medicines, herbs, non-prescription drugs, or dietary supplements you use. Also tell them if you smoke, drink alcohol, or use illegal drugs. Some items may interact with your medicine. What should I watch for while using this medicine? It may take a few weeks to notice the full benefit from this medicine. You may need to limit your intake tea, coffee, caffeinated sodas, and alcohol. These drinks may make your symptoms worse. You may get drowsy or dizzy. Do not drive, use machinery, or do anything that needs mental alertness until you know how this medicine affects you. Do not stand or sit up quickly, especially if you are an older patient. This reduces the risk of dizzy or fainting spells. Alcohol may interfere with the effect of this medicine. Avoid alcoholic drinks. Your mouth may get dry. Chewing sugarless gum or sucking hard candy, and drinking plenty of water may help. Contact your doctor if the problem does not go away or is severe. This medicine may cause dry eyes and blurred vision. If you wear contact lenses, you may feel some discomfort. Lubricating drops may help. See your eyecare professional if the problem does not go away or is severe. Avoid extreme heat. This medicine can cause you to sweat less than normal. Your body temperature could increase to dangerous levels, which may lead to heat stroke. What side  effects may I notice from receiving this medicine? Side effects that you should report to your doctor or health care professional as soon as  possible: -allergic reactions like skin rash, itching or hives, swelling of the face, lips, or tongue -agitation -breathing problems -confusion -fever -flushing (reddening of the skin) -hallucinations -memory loss -pain or difficulty passing urine -palpitations -unusually weak or tired Side effects that usually do not require medical attention (report to your doctor or health care professional if they continue or are bothersome): -constipation -headache -sexual difficulties (impotence) This list may not describe all possible side effects. Call your doctor for medical advice about side effects. You may report side effects to FDA at 1-800-FDA-1088. Where should I keep my medicine? Keep out of the reach of children. Store at room temperature between 15 and 30 degrees C (59 and 86 degrees F). Protect from moisture and humidity. Throw away any unused medicine after the expiration date. NOTE: This sheet is a summary. It may not cover all possible information. If you have questions about this medicine, talk to your doctor, pharmacist, or health care provider.  2018 Elsevier/Gold Standard (2013-04-20 10:57:08)

## 2017-09-20 NOTE — Progress Notes (Signed)
Subjective   STRIDER VALLANCE 22 y.o. male  [id number]  [id number]  February 09, 1996  22 y.o.   Past Medical History:  Diagnosis Date  . Marijuana smoker, continuous   . Scoliosis   . Spina bifida (HCC)   . Urethritis     Social History   Socioeconomic History  . Marital status: Single    Spouse name: Not on file  . Number of children: Not on file  . Years of education: Not on file  . Highest education level: Not on file  Occupational History  . Not on file  Social Needs  . Financial resource strain: Not on file  . Food insecurity:    Worry: Not on file    Inability: Not on file  . Transportation needs:    Medical: Not on file    Non-medical: Not on file  Tobacco Use  . Smoking status: Current Some Day Smoker    Packs/day: 0.50    Types: Cigarettes  . Smokeless tobacco: Never Used  Substance and Sexual Activity  . Alcohol use: Not Currently  . Drug use: Yes    Types: Marijuana    Comment: daily  . Sexual activity: Not on file  Lifestyle  . Physical activity:    Days per week: Not on file    Minutes per session: Not on file  . Stress: Not on file  Relationships  . Social connections:    Talks on phone: Not on file    Gets together: Not on file    Attends religious service: Not on file    Active member of club or organization: Not on file    Attends meetings of clubs or organizations: Not on file    Relationship status: Not on file  . Intimate partner violence:    Fear of current or ex partner: Not on file    Emotionally abused: Not on file    Physically abused: Not on file    Forced sexual activity: Not on file  Other Topics Concern  . Not on file  Social History Narrative  . Not on file    Past Surgical History:  Procedure Laterality Date  . BACK SURGERY     as a baby due to spina bifida      Chief Complaint  Patient presents with  . Establish Care    follow up form ED      Mr. Wandel is a 22 year old male with PMHx significant for  spina bifida requiring self catheterization  and recurrent UTIs who presented to the ED on 08/16/17 with complaints of scrotal pain and swelling. Given IV rocephin and oral doxy.   He presents today to establish care with this clinic. Patient states that he cannot afford his daily catheters and he is getting frequent UTIs due to this. Patient states that he has to reuse the catheters. Patient states that he was treated for epidydimytis. Pain is mainly resolved and he experiences intermittent pain after intercourse.    Review of Systems  Constitutional: Negative.   HENT: Negative.   Eyes: Negative.   Respiratory: Negative.   Cardiovascular: Negative.   Gastrointestinal: Negative.   Genitourinary: Negative.        Patient with neurogenic bladder.   Musculoskeletal: Positive for back pain.  Skin: Negative.   Neurological: Negative.   Psychiatric/Behavioral: Negative.     Objective  Physical Exam  Constitutional: He is oriented to person, place, and time. He appears well-developed and well-nourished. No  distress.  HENT:  Head: Normocephalic and atraumatic.  Right Ear: External ear normal.  Left Ear: External ear normal.  Mouth/Throat: Oropharynx is clear and moist. No oropharyngeal exudate.  Eyes: Pupils are equal, round, and reactive to light. Conjunctivae and EOM are normal.  Neck: Normal range of motion. Neck supple. No thyromegaly present.  Cardiovascular: Normal rate, regular rhythm, normal heart sounds and intact distal pulses.  No murmur heard. Pulmonary/Chest: Effort normal and breath sounds normal. No respiratory distress. He has no wheezes.  Abdominal: Soft. Bowel sounds are normal. He exhibits no distension and no mass. There is no tenderness.  Musculoskeletal: He exhibits no edema.  Scoliosis noted to the thoracic and lumbar spine.  Decreased ROM of the right ankle due to tight achilles.   Neurological: He is alert and oriented to person, place, and time.  Skin: Skin is  warm and dry.  Psychiatric: He has a normal mood and affect. His behavior is normal. Judgment and thought content normal.     BP 123/82 (BP Location: Left Arm, Patient Position: Sitting, Cuff Size: Normal)   Pulse 78   Temp 98.4 F (36.9 C) (Oral)   Resp 16   SpO2 100%   Assessment   Encounter Diagnoses  Name Primary?  . Spina bifida, unspecified hydrocephalus presence, unspecified spinal region (HCC)   . Neurogenic bladder   . Leukocytosis, unspecified type Yes     Plan   1. Spina bifida, unspecified hydrocephalus presence, unspecified spinal region [facility](HCC) Patient referred for discounted catheters.   2. Neurogenic bladder  - oxybutynin (DITROPAN) 5 MG tablet; Take 1 tablet (5 mg total) by mouth 2 (two) times daily.  Dispense: 60 tablet; Refill: 2  3. Leukocytosis, unspecified type  - CBC with Differential - Comprehensive metabolic panel    The patient was given clear instructions to go to ER or return to medical center if symptoms do not improve, worsen or new problems develop. The patient verbalized understanding and agreed with plan of care.    Ms. Freda Jacksonndr L. Riley Lamouglas, FNP-BC [facility] [address]  [place], [place] [id number] [phone]      This note has been created with Dragon speech recognition software and smart phrase technology. Any transcriptional errors are unintentional.

## 2017-09-21 LAB — COMPREHENSIVE METABOLIC PANEL
ALT: 10 IU/L (ref 0–44)
AST: 14 IU/L (ref 0–40)
Albumin/Globulin Ratio: 1.8 (ref 1.2–2.2)
Albumin: 4.4 g/dL (ref 3.5–5.5)
Alkaline Phosphatase: 97 IU/L (ref 39–117)
BUN/Creatinine Ratio: 18 (ref 9–20)
BUN: 17 mg/dL (ref 6–20)
Bilirubin Total: 0.3 mg/dL (ref 0.0–1.2)
CO2: 24 mmol/L (ref 20–29)
Calcium: 9.1 mg/dL (ref 8.7–10.2)
Chloride: 102 mmol/L (ref 96–106)
Creatinine, Ser: 0.93 mg/dL (ref 0.76–1.27)
GFR calc Af Amer: 133 mL/min/{1.73_m2} (ref >59)
GFR calc non Af Amer: 115 mL/min/{1.73_m2} (ref >59)
Globulin, Total: 2.5 g/dL (ref 1.5–4.5)
Glucose: 58 mg/dL — ABNORMAL LOW (ref 65–99)
Potassium: 4.4 mmol/L (ref 3.5–5.2)
Sodium: 142 mmol/L (ref 134–144)
Total Protein: 6.9 g/dL (ref 6.0–8.5)

## 2017-09-21 LAB — CBC WITH DIFFERENTIAL/PLATELET
Basophils Absolute: 0.1 10*3/uL (ref 0.0–0.2)
Basos: 1 %
EOS (ABSOLUTE): 0.2 10*3/uL (ref 0.0–0.4)
Eos: 2 %
Hematocrit: 45.1 % (ref 37.5–51.0)
Hemoglobin: 14.8 g/dL (ref 13.0–17.7)
Immature Grans (Abs): 0 10*3/uL (ref 0.0–0.1)
Immature Granulocytes: 0 %
Lymphocytes Absolute: 2.4 10*3/uL (ref 0.7–3.1)
Lymphs: 25 %
MCH: 29.7 pg (ref 26.6–33.0)
MCHC: 32.8 g/dL (ref 31.5–35.7)
MCV: 90 fL (ref 79–97)
Monocytes Absolute: 0.7 10*3/uL (ref 0.1–0.9)
Monocytes: 8 %
Neutrophils Absolute: 6.2 10*3/uL (ref 1.4–7.0)
Neutrophils: 64 %
Platelets: 246 10*3/uL (ref 150–450)
RBC: 4.99 x10E6/uL (ref 4.14–5.80)
RDW: 14.1 % (ref 12.3–15.4)
WBC: 9.6 10*3/uL (ref 3.4–10.8)

## 2017-10-01 ENCOUNTER — Ambulatory Visit (INDEPENDENT_AMBULATORY_CARE_PROVIDER_SITE_OTHER): Payer: Self-pay | Admitting: Family Medicine

## 2017-10-01 VITALS — BP 128/86 | HR 74 | Temp 98.1°F | Resp 14 | Ht 65.0 in | Wt 134.0 lb

## 2017-10-01 DIAGNOSIS — N319 Neuromuscular dysfunction of bladder, unspecified: Secondary | ICD-10-CM

## 2017-10-01 DIAGNOSIS — R300 Dysuria: Secondary | ICD-10-CM

## 2017-10-01 LAB — POCT URINALYSIS DIPSTICK
Bilirubin, UA: NEGATIVE
Blood, UA: NEGATIVE
Glucose, UA: NEGATIVE
Ketones, UA: NEGATIVE
Nitrite, UA: NEGATIVE
Protein, UA: NEGATIVE
Spec Grav, UA: 1.025 (ref 1.010–1.025)
Urobilinogen, UA: 0.2 E.U./dL
pH, UA: 5.5 (ref 5.0–8.0)

## 2017-10-01 MED ORDER — CEPHALEXIN 500 MG PO CAPS: 500.0000 mg | ORAL_CAPSULE | Freq: Two times a day (BID) | ORAL | 0 refills | Status: AC

## 2017-10-01 NOTE — Patient Instructions (Signed)
Neurogenic Bladder Neurogenic bladder is a bladder control disorder. It is caused by problems with the nerves that control your bladder. This condition may make your bladder overactive or underactive. You may have trouble holding your urine or passing your urine (urinating). The bladder is a hollow organ in the lower part of your abdomen. It stores urine after the urine is made by your kidneys. Normally, when your bladder is not full, the muscles that control your bladder are relaxed. When your bladder fills with urine, nerve signals are sent to your brain, indicating that your bladder is full. Your brain then sends signals through your spinal cord to the muscles in your bladder that start and stop urine flow. If you have neurogenic bladder, the nerves and muscles do not work together the way they should. What are the causes? Any kind of nerve damage or condition that disrupts the signals from your brain to your bladder can cause neurogenic bladder. Many things can cause these nerve problems. They include:  A disease that affects the nervous system, such as: ? Alzheimer disease. ? Cerebral palsy. ? Multiple sclerosis. ? Diabetes. ? Parkinson disease.  Damage to your brain or spinal cord from: ? Trauma. ? Tumor. ? Infection. ? Surgery. ? Alcohol abuse. ? Stroke. ? A spinal cord birth defect.  What increases the risk? Having nerve damage or a nerve disorder puts you at risk for neurogenic bladder. What are the signs or symptoms? Signs and symptoms of neurogenic bladder include:  Leaking or gushing urine (incontinence).  A sudden, strong urge to pass urine (urgency).  Frequent urination during the day and night.  Being unable to empty your bladder completely (urinary retention).  Frequent urinary tract infections.  How is this diagnosed? Your health care provider may diagnose neurogenic bladder based on your symptoms and medical history. A physical exam will also be done. You may  be asked to keep a record of your bladder symptoms and the times that you urinate (bladder diary). Your health care provider may also do several tests to help diagnose neurogenic bladder, including:  A urine test to check for infection.  A bladder scan after you urinate to see how much urine is left in your bladder.  Various tests to measure your urine flow and see how well the flow is controlled (urodynamic tests).  A procedure that involves using a tool that is like a very thin telescope to look through your urethra into your bladder (cystoscopy). A health care provider who specializes in the urinary tract (urologist) may do this test.  Imaging tests of your brain or spine.  How is this treated? Treatment depends on the cause of your neurogenic bladder and the symptoms you are having. Work closely with your health care provider to find the treatments that will improve your quality of life. Treatment options include:  Learning ways to control when you urinate, such as: ? Urinating at scheduled times. ? Training yourself to delay urination. ? Doing exercises (Kegel exercises) to strengthen the muscles that control urine flow. ? Avoiding foods or drinks that make your symptoms worse.  Taking medicines to: ? Stimulate an underactive bladder. ? Relax an overactive bladder. ? Treat a urinary tract infection.  Learning how to use a thin tube (catheter) to empty your bladder. A catheter is a hollow tube that you pass through your urethra.  Procedures to stimulate the nerves that control your bladder.  Surgical procedures if other treatments do not help.  Follow these instructions   at home:  Keep a bladder diary to find out which foods, liquids, or activities make your symptoms worse.  Use your bladder diary to schedule bathroom trips. If you are away from home, plan to be near a bathroom when your schedule says you need one.  Do Kegel exercises to strengthen the muscles that control  urination. These muscles are the ones you use to try to hold urine when you need to go. To do Kegel exercises: ? Squeeze these muscles tight and hold for about 10 seconds. ? Repeat three times. ? Do these exercises often during the day when you do not have to urinate.  Limit your drinking of beverages that stimulate urination. These include soda, coffee, and tea.  After urinating, wait a few minutes and try again (double voiding).  Make sure you urinate just before you leave the house and just before you go to bed.  Take medicines only as directed by your health care provider.  Keep all follow-up visits as directed by your health care provider. This is important. Contact a health care provider if:  You are having a hard time controlling your symptoms.  Your symptoms are getting worse.  You have signs of a urinary tract infection: ? A burning feeling when you urinate. ? Chills. ? Fever. Get help right away if: You cannot pass urine. This information is not intended to replace advice given to you by your health care provider. Make sure you discuss any questions you have with your health care provider. Document Released: 08/16/2006 Document Revised: 07/11/2015 Document Reviewed: 05/16/2013 Elsevier Interactive Patient Education  2018 Elsevier Inc.  

## 2017-10-01 NOTE — Progress Notes (Signed)
[facility]   Progress Note: General Provider: Mike GipAndre Genesis Paget, FNP  SUBJECTIVE:   Chief Complaint  Patient presents with  . Abdominal Pain    has not cath more frequently  . Urine Output    cloudy pee     Patient with a hx of neurogenic bladder, scoliosis and spina bifida. Presents today with abdominal pain and cloudy urine. Patient self catheterizes and has been reusing catheters or not cathing as frequently.    Past Medical History:  Diagnosis Date  . Marijuana smoker, continuous   . Scoliosis   . Spina bifida (HCC)   . Urethritis     Social History   Socioeconomic History  . Marital status: Single    Spouse name: Not on file  . Number of children: Not on file  . Years of education: Not on file  . Highest education level: Not on file  Occupational History  . Not on file  Social Needs  . Financial resource strain: Not on file  . Food insecurity:    Worry: Not on file    Inability: Not on file  . Transportation needs:    Medical: Not on file    Non-medical: Not on file  Tobacco Use  . Smoking status: Current Some Day Smoker    Packs/day: 0.50    Types: Cigarettes  . Smokeless tobacco: Never Used  Substance and Sexual Activity  . Alcohol use: Not Currently  . Drug use: Yes    Types: Marijuana    Comment: daily  . Sexual activity: Not on file  Lifestyle  . Physical activity:    Days per week: Not on file    Minutes per session: Not on file  . Stress: Not on file  Relationships  . Social connections:    Talks on phone: Not on file    Gets together: Not on file    Attends religious service: Not on file    Active member of club or organization: Not on file    Attends meetings of clubs or organizations: Not on file    Relationship status: Not on file  . Intimate partner violence:    Fear of current or ex partner: Not on file    Emotionally abused: Not on file    Physically abused: Not on file    Forced  sexual activity: Not on file  Other Topics Concern  . Not on file  Social History Narrative  . Not on file      Review of Systems  Constitutional: Negative.   Gastrointestinal: Positive for abdominal pain.  Neurological: Negative.   Psychiatric/Behavioral: Negative.     OBJECTIVE: Ht 5\' 5"  (1.651 m)   Wt 134 lb (60.8 kg)   BMI 22.30 kg/m   Physical Exam  Constitutional: He appears well-developed and well-nourished. No distress.  Cardiovascular: Normal rate, regular rhythm, normal heart sounds and intact distal pulses.  No murmur heard. Pulmonary/Chest: Effort normal and breath sounds normal.  Abdominal: Bowel sounds are normal. There is tenderness in the suprapubic area. There is no rigidity.  Nursing note and vitals reviewed.   ASSESSMENT/PLAN:  1. Neurogenic bladder  - Urinalysis Dipstick - Urine Culture  2. Dysuria  - Urinalysis Dipstick - Urine Culture - cephALEXin (KEFLEX) 500 MG capsule; Take 1 capsule (500 mg total) by mouth 2 (two) times daily for 10 days.  Dispense: 20 capsule; Refill: 0         The patient was given  clear instructions to go to ER or return to medical center if symptoms do not improve, worsen or new problems develop. The patient verbalized understanding and agreed with plan of care.   Ms. Freda Jacksonndr L. Riley Lamouglas, FNP-BC [facility] [address]  [place], [place] [id number] [phone]     This note has been created with Dragon speech recognition software and smart phrase technology. Any transcriptional errors are unintentional.

## 2017-10-03 LAB — URINE CULTURE

## 2017-11-15 ENCOUNTER — Ambulatory Visit (INDEPENDENT_AMBULATORY_CARE_PROVIDER_SITE_OTHER): Payer: Self-pay | Admitting: Family Medicine

## 2017-11-15 VITALS — BP 122/77 | HR 70 | Temp 97.9°F | Resp 16 | Ht 65.0 in | Wt 135.0 lb

## 2017-11-15 DIAGNOSIS — R300 Dysuria: Secondary | ICD-10-CM

## 2017-11-15 DIAGNOSIS — N319 Neuromuscular dysfunction of bladder, unspecified: Secondary | ICD-10-CM

## 2017-11-15 DIAGNOSIS — Q059 Spina bifida, unspecified: Secondary | ICD-10-CM

## 2017-11-15 LAB — POCT URINALYSIS DIPSTICK
Bilirubin, UA: NEGATIVE
Blood, UA: NEGATIVE
Glucose, UA: NEGATIVE
Ketones, UA: NEGATIVE
Nitrite, UA: NEGATIVE
Protein, UA: NEGATIVE
Spec Grav, UA: 1.025 (ref 1.010–1.025)
Urobilinogen, UA: 0.2 E.U./dL
pH, UA: 6 (ref 5.0–8.0)

## 2017-11-15 MED ORDER — CEPHALEXIN 500 MG PO CAPS: 500.0000 mg | ORAL_CAPSULE | Freq: Two times a day (BID) | ORAL | 0 refills | Status: AC

## 2017-11-15 MED ORDER — NITROFURANTOIN MONOHYD MACRO 100 MG PO CAPS: 100.0000 mg | ORAL_CAPSULE | Freq: Every day | ORAL | 0 refills | Status: AC

## 2017-11-15 NOTE — Progress Notes (Signed)
  [facility]   Progress Note: General Provider: Mike Gip, FNP  SUBJECTIVE:   Riley Simmons is a 22 y.o. male who  has a past medical history of Marijuana smoker, continuous, Scoliosis, Spina bifida (HCC), and Urethritis.. Patient presents today for Urine Output (urine has odor/ urinary frequency ) Patient with recurrent UTIs due to not changing catheters as directed. This is due to a limitation in catheters due to lack of insurance.   Review of Systems  Constitutional: Negative.  Negative for fever.  HENT: Negative.   Eyes: Negative.   Respiratory: Negative.   Cardiovascular: Negative.   Gastrointestinal: Negative.  Negative for abdominal pain, nausea and vomiting.  Genitourinary: Positive for dysuria.  Musculoskeletal: Negative.   Skin: Negative.   Neurological: Negative.   Psychiatric/Behavioral: Negative.      OBJECTIVE: BP 122/77 (BP Location: Right Arm, Patient Position: Sitting, Cuff Size: Normal)   Pulse 70   Temp 97.9 F (36.6 C) (Oral)   Resp 16   Ht 5\' 5"  (1.651 m)   Wt 135 lb (61.2 kg)   SpO2 99%   BMI 22.47 kg/m   Physical Exam  Constitutional: He is oriented to person, place, and time. He appears well-developed and well-nourished. No distress.  HENT:  Head: Normocephalic and atraumatic.  Eyes: Pupils are equal, round, and reactive to light. Conjunctivae and EOM are normal.  Neck: Normal range of motion.  Cardiovascular: Normal rate, regular rhythm, normal heart sounds and intact distal pulses.  Pulmonary/Chest: Effort normal and breath sounds normal. No respiratory distress.  Abdominal: Soft. Bowel sounds are normal. He exhibits no distension.  Musculoskeletal:  baseline  Neurological: He is alert and oriented to person, place, and time.  Skin: Skin is warm and dry.  Psychiatric: He has a normal mood and affect. His behavior is normal. Judgment and thought content normal.  Nursing note and vitals  reviewed.   ASSESSMENT/PLAN:   1. Dysuria Patient advised on proper use of catheterization. Advised to apply for financial assitance.  Patient to complete keflex and then start macrobid QD.  - Urinalysis Dipstick - Urine Culture - Ambulatory referral to Urology - cephALEXin (KEFLEX) 500 MG capsule; Take 1 capsule (500 mg total) by mouth 2 (two) times daily for 10 days.  Dispense: 20 capsule; Refill: 0 - nitrofurantoin, macrocrystal-monohydrate, (MACROBID) 100 MG capsule; Take 1 capsule (100 mg total) by mouth at bedtime.  Dispense: 30 capsule; Refill: 0  2. Spina bifida, unspecified hydrocephalus presence, unspecified spinal region [facility]) - Ambulatory referral to Urology  3. Neurogenic bladder - Ambulatory referral to Urology        The patient was given clear instructions to go to ER or return to medical center if symptoms do not improve, worsen or new problems develop. The patient verbalized understanding and agreed with plan of care.   Ms. Freda Jackson. Riley Lam, FNP-BC [facility] [address] [place], [place] [address] [phone]     This note has been created with Dragon speech recognition software and smart phrase technology. Any transcriptional errors are unintentional.

## 2017-11-15 NOTE — Patient Instructions (Signed)
Urinary Tract Infection, Adult °A urinary tract infection (UTI) is an infection of any part of the urinary tract. The urinary tract includes the: °· Kidneys. °· Ureters. °· Bladder. °· Urethra. ° °These organs make, store, and get rid of pee (urine) in the body. °Follow these instructions at home: °· Take over-the-counter and prescription medicines only as told by your doctor. °· If you were prescribed an antibiotic medicine, take it as told by your doctor. Do not stop taking the antibiotic even if you start to feel better. °· Avoid the following drinks: °? Alcohol. °? Caffeine. °? Tea. °? Carbonated drinks. °· Drink enough fluid to keep your pee clear or pale yellow. °· Keep all follow-up visits as told by your doctor. This is important. °· Make sure to: °? Empty your bladder often and completely. Do not to hold pee for long periods of time. °? Empty your bladder before and after sex. °? Wipe from front to back after a bowel movement if you are male. Use each tissue one time when you wipe. °Contact a doctor if: °· You have back pain. °· You have a fever. °· You feel sick to your stomach (nauseous). °· You throw up (vomit). °· Your symptoms do not get better after 3 days. °· Your symptoms go away and then come back. °Get help right away if: °· You have very bad back pain. °· You have very bad lower belly (abdominal) pain. °· You are throwing up and cannot keep down any medicines or water. °This information is not intended to replace advice given to you by your health care provider. Make sure you discuss any questions you have with your health care provider. °Document Released: 07/22/2007 Document Revised: 07/11/2015 Document Reviewed: 12/24/2014 °Elsevier Interactive Patient Education © 2018 Elsevier Inc. ° °

## 2017-11-17 ENCOUNTER — Encounter: Payer: Self-pay | Admitting: Family Medicine

## 2017-11-18 LAB — URINE CULTURE

## 2017-11-22 ENCOUNTER — Ambulatory Visit: Payer: Self-pay | Admitting: Family Medicine

## 2017-11-23 ENCOUNTER — Telehealth: Payer: Self-pay

## 2017-11-23 NOTE — Telephone Encounter (Signed)
Called to advise of appointment for 11/25/2017.. Was hung up on.

## 2017-11-25 ENCOUNTER — Ambulatory Visit: Payer: Self-pay | Admitting: Family Medicine

## 2017-12-16 ENCOUNTER — Other Ambulatory Visit: Payer: Self-pay

## 2017-12-16 ENCOUNTER — Emergency Department (HOSPITAL_COMMUNITY)
Admission: EM | Admit: 2017-12-16 | Discharge: 2017-12-16 | Disposition: A | Discharge status: Home / Self Care | Payer: Self-pay | Attending: Emergency Medicine | Admitting: Emergency Medicine

## 2017-12-16 ENCOUNTER — Encounter (HOSPITAL_COMMUNITY): Payer: Self-pay | Admitting: Emergency Medicine

## 2017-12-16 DIAGNOSIS — R350 Frequency of micturition: Secondary | ICD-10-CM

## 2017-12-16 DIAGNOSIS — Z79899 Other long term (current) drug therapy: Secondary | ICD-10-CM | POA: Insufficient documentation

## 2017-12-16 DIAGNOSIS — F17210 Nicotine dependence, cigarettes, uncomplicated: Secondary | ICD-10-CM | POA: Insufficient documentation

## 2017-12-16 LAB — URINALYSIS, ROUTINE W REFLEX MICROSCOPIC
BILIRUBIN URINE: NEGATIVE
Bacteria, UA: NONE SEEN
GLUCOSE, UA: NEGATIVE mg/dL
HGB URINE DIPSTICK: NEGATIVE
KETONES UR: NEGATIVE mg/dL
Nitrite: NEGATIVE
Protein, ur: NEGATIVE mg/dL
SPECIFIC GRAVITY, URINE: 1.02 (ref 1.005–1.030)
pH: 6 (ref 5.0–8.0)

## 2017-12-16 MED ORDER — CEPHALEXIN 500 MG PO CAPS: 500.0000 mg | ORAL_CAPSULE | Freq: Two times a day (BID) | ORAL | 0 refills | Status: DC

## 2017-12-16 NOTE — Discharge Instructions (Signed)
Please read and follow all provided instructions.  Your diagnoses today include:  1. Urinary frequency     Tests performed today include:  Urine test - suggests that you have an infection in your bladder   Urine culture-pending  Vital signs. See below for your results today.   Medications prescribed:   Keflex (cephalexin) - antibiotic  You have been prescribed an antibiotic medicine: take the entire course of medicine even if you are feeling better. Stopping early can cause the antibiotic not to work.  Home care instructions:  Follow any educational materials contained in this packet.  Follow-up instructions: Please follow-up with your primary care provider in 3 days if symptoms are not resolved for further evaluation of your symptoms.  Return instructions:   Please return to the Emergency Department if you experience worsening symptoms.   Return with fever, worsening pain, persistent vomiting, worsening pain in your back.   Please return if you have any other emergent concerns.  Additional Information:  Your vital signs today were: BP (!) 151/93 (BP Location: Right Arm)    Pulse 77    Temp 99 F (37.2 C) (Oral)    Resp 16    Ht 5\' 5"  (1.651 m)    Wt 61.2 kg    SpO2 98%    BMI 22.47 kg/m  If your blood pressure (BP) was elevated above 135/85 this visit, please have this repeated by your doctor within one month. --------------

## 2017-12-16 NOTE — ED Provider Notes (Signed)
[facility] EMERGENCY DEPARTMENT Provider Note   CSN: [id number] Arrival date & time: 12/16/17  1808     History   Chief Complaint Chief Complaint  Patient presents with  . Urinary Frequency  . Recurrent UTI    HPI Riley Simmons is a 22 y.o. male.  Patient with history of self-catheterization, frequent urinary tract infection and pyelonephritis -- presents the emergency department with complaint of increased frequency, back pain, and left-sided abdominal pain.  He states that he felt his symptoms coming on for 3 or 4 days prior to today.  Symptoms were much worse today.  Patient states that he self caths but cannot afford to use a clean catheter every time.  Last UTI was approximately a month ago.  He denies other drainage or discharge.  States that Keflex typically works well for him.  He does not take any prophylactic antibiotics at this time.  No reported fevers or vomiting.     Past Medical History:  Diagnosis Date  . Marijuana smoker, continuous   . Scoliosis   . Spina bifida ([place])   . Urethritis     Patient Active Problem List   Diagnosis Date Noted  . Spina bifida ([place]) 09/20/2017  . Neurogenic bladder 09/20/2017  . Leukocytosis   . Pain in right testicle   . Epididymitis 08/16/2017    Past Surgical History:  Procedure Laterality Date  . BACK SURGERY     as a baby due to spina bifida         Home Medications    Prior to Admission medications   Medication Sig Start Date End Date Taking? Authorizing Provider  acetaminophen (TYLENOL) 500 MG tablet Take 500 mg by mouth every 6 (six) hours as needed for mild pain or headache.    [provider]  Catheters KIT Coloplast 12 french Provide 30 self catheterization kits for PRN catheterization 07/31/16   Pisciotta, Elmyra Ricks, PA-C  ibuprofen (ADVIL,MOTRIN) 200 MG tablet Take 800 mg by mouth every 6 (six) hours as needed for moderate pain.    [provider]  oxybutynin  (DITROPAN) 5 MG tablet Take 1 tablet (5 mg total) by mouth 2 (two) times daily. Patient not taking: Reported on 11/15/2017 09/20/17   Lanae Boast, FNP    Family History No family history on file.  Social History Social History   Tobacco Use  . Smoking status: Current Some Day Smoker    Packs/day: 0.50    Types: Cigarettes  . Smokeless tobacco: Never Used  Substance Use Topics  . Alcohol use: Not Currently  . Drug use: Yes    Types: Marijuana    Comment: daily     Allergies   Levofloxacin; Amoxicillin; and Latex   Review of Systems Review of Systems  Constitutional: Negative for fever.  HENT: Negative for rhinorrhea and sore throat.   Eyes: Negative for redness.  Respiratory: Negative for cough.   Cardiovascular: Negative for chest pain.  Gastrointestinal: Positive for abdominal pain. Negative for diarrhea, nausea and vomiting.  Genitourinary: Positive for dysuria, frequency and urgency.  Musculoskeletal: Positive for back pain. Negative for myalgias.  Skin: Negative for rash.  Neurological: Negative for headaches.     Physical Exam Updated Vital Signs BP (!) 151/93 (BP Location: Right Arm)   Pulse 77   Temp 99 F (37.2 C) (Oral)   Resp 16   Ht '5\' 5"'  (1.651 m)   Wt 61.2 kg   SpO2 98%   BMI 22.47 kg/m  Physical Exam  Constitutional: He appears well-developed and well-nourished.  HENT:  Head: Normocephalic and atraumatic.  Eyes: Conjunctivae are normal. Right eye exhibits no discharge. Left eye exhibits no discharge.  Neck: Normal range of motion. Neck supple.  Cardiovascular: Normal rate, regular rhythm and normal heart sounds.  Pulmonary/Chest: Effort normal and breath sounds normal.  Abdominal: Soft. There is tenderness (Mild left-sided abdominal pain).  Neurological: He is alert.  Skin: Skin is warm and dry.  Psychiatric: He has a normal mood and affect.  Nursing note and vitals reviewed.    ED Treatments / Results  Labs (all labs ordered are  listed, but only abnormal results are displayed) Labs Reviewed  URINALYSIS, ROUTINE W REFLEX MICROSCOPIC - Abnormal; Notable for the following components:      Result Value   Leukocytes, UA TRACE (*)    All other components within normal limits  URINE CULTURE    EKG None  Radiology No results found.  Procedures Procedures (including critical care time)  Medications Ordered in ED Medications - No data to display   Initial Impression / Assessment and Plan / ED Course  I have reviewed the triage vital signs and the nursing notes.  Pertinent labs & imaging results that were available during my care of the patient were reviewed by me and considered in my medical decision making (see chart for details).     Patient seen and examined.  UA pending.  Culture sent.  Patient is well-appearing.  Low concern for pyelonephritis given appearance.  If signs of UTI, will treat with antibiotics.  Vital signs reviewed and are as follows: BP (!) 151/93 (BP Location: Right Arm)   Pulse 77   Temp 99 F (37.2 C) (Oral)   Resp 16   Ht '5\' 5"'  (1.651 m)   Wt 61.2 kg   SpO2 98%   BMI 22.47 kg/m   8:10 PM patient will be discharged home with Keflex.  We discussed signs and symptoms of pyelonephritis and need to return if these occur.  Patient is familiar with the symptoms.  Home with work note.  Final Clinical Impressions(s) / ED Diagnoses   Final diagnoses:  Urinary frequency   Patient who is high risk for urinary tract infection with frequent UTI due to self-catheterization --presents with irritative UTI symptoms.  UA shows 6-10 white blood cells per high-power field.  Otherwise clear.  Given clinical symptoms, high risk patient, with these findings, culture sent and he will be started on Keflex.  ED Discharge Orders         Ordered    cephALEXin (KEFLEX) 500 MG capsule  2 times daily     12/16/17 1945           Carlisle Cater, Hershal Coria 12/16/17 2011    Hayden Rasmussen,  MD 12/17/17 1150

## 2017-12-16 NOTE — ED Triage Notes (Signed)
Pt reports hx of spina bifida with recurrent catheterizations. Pt reports he reuses catheters because of money tightness. Pt reports recurrent UTIs. Pt reports left flank pain, urinary frequency and urgency that has gotten worse over the last week.

## 2017-12-18 LAB — URINE CULTURE: Culture: >=100000 — AB

## 2017-12-19 ENCOUNTER — Telehealth: Payer: Self-pay

## 2017-12-19 NOTE — Progress Notes (Signed)
ED Antimicrobial Stewardship Positive Culture Follow Up   Riley Simmons is an 22 y.o. male who presented to [facility] on 12/16/2017 with a chief complaint of  Chief Complaint  Patient presents with  . Urinary Frequency  . Recurrent UTI   Recent Results (from the past 720 hour(s))  Urine Culture     Status: Abnormal   Collection Time: 12/16/17  6:40 PM  Result Value Ref Range Status   Specimen Description URINE, CLEAN CATCH  Final   Special Requests   Final    NONE Performed at [facility], [address], [place], [place] [address]    Culture >=100,000 COLONIES/mL PROVIDENCIA RETTGERI (A)  Final   Report Status 12/18/2017 FINAL  Final   Organism ID, Bacteria PROVIDENCIA RETTGERI (A)  Final      Susceptibility   Providencia rettgeri - MIC*    AMPICILLIN >=32 RESISTANT Resistant     CEFAZOLIN >=64 RESISTANT Resistant     CEFTRIAXONE <=1 SENSITIVE Sensitive     CIPROFLOXACIN <=0.25 SENSITIVE Sensitive     GENTAMICIN <=1 SENSITIVE Sensitive     IMIPENEM 2 SENSITIVE Sensitive     NITROFURANTOIN 128 RESISTANT Resistant     TRIMETH/SULFA <=20 SENSITIVE Sensitive     AMPICILLIN/SULBACTAM >=32 RESISTANT Resistant     PIP/TAZO <=4 SENSITIVE Sensitive     * >=100,000 COLONIES/mL PROVIDENCIA RETTGERI   [x]  Treated with cephalexin, organism resistant to prescribed antimicrobial  New antibiotic prescription: Bactrim 1 DS tablet PO BID x7 days  ED Provider: Harlene Salts, PA-C  Roderic Scarce Zigmund Daniel, PharmD PGY2 Infectious Diseases Pharmacy Resident Phone: [phone] 12/19/2017, 9:19 AM

## 2017-12-19 NOTE — Telephone Encounter (Signed)
Post ED Visit - Positive Culture Follow-up: Unsuccessful Patient Follow-up  Culture assessed and recommendations reviewed by:  []  Enzo Bi, Pharm.D. []  Celedonio Miyamoto, [address].D., BCPS AQ-ID []  Garvin Fila, Pharm.D., BCPS []  Georgina Pillion, Pharm.D., BCPS []  [place], [address].D., BCPS, AAHIVP []  Estella Husk, Pharm.D., BCPS, AAHIVP []  Sherlynn Carbon, PharmD []  Pollyann Samples, PharmD, BCPS Erin Deja Pharm D Positive urien culture  []  Patient discharged without antimicrobial prescription and treatment is now indicated [x]  Organism is resistant to prescribed ED discharge antimicrobial []  Patient with positive blood cultures   Unable to contact patient after 3 attempts, letter will be sent to address on file  Jerry Caras 12/19/2017, 4:33 PM

## 2018-02-17 ENCOUNTER — Emergency Department (HOSPITAL_COMMUNITY)
Admission: EM | Admit: 2018-02-17 | Discharge: 2018-02-18 | Disposition: A | Discharge status: Home / Self Care | Payer: Self-pay | Attending: Emergency Medicine | Admitting: Emergency Medicine

## 2018-02-17 ENCOUNTER — Encounter (HOSPITAL_COMMUNITY): Payer: Self-pay | Admitting: Emergency Medicine

## 2018-02-17 DIAGNOSIS — R197 Diarrhea, unspecified: Secondary | ICD-10-CM | POA: Insufficient documentation

## 2018-02-17 DIAGNOSIS — Z91040 Latex allergy status: Secondary | ICD-10-CM | POA: Insufficient documentation

## 2018-02-17 DIAGNOSIS — F17210 Nicotine dependence, cigarettes, uncomplicated: Secondary | ICD-10-CM | POA: Insufficient documentation

## 2018-02-17 DIAGNOSIS — R112 Nausea with vomiting, unspecified: Secondary | ICD-10-CM | POA: Insufficient documentation

## 2018-02-17 LAB — URINALYSIS, ROUTINE W REFLEX MICROSCOPIC
Bilirubin Urine: NEGATIVE
GLUCOSE, UA: NEGATIVE mg/dL
HGB URINE DIPSTICK: NEGATIVE
Ketones, ur: 20 mg/dL — AB
LEUKOCYTES UA: NEGATIVE
Nitrite: NEGATIVE
PH: 5 (ref 5.0–8.0)
PROTEIN: NEGATIVE mg/dL
SPECIFIC GRAVITY, URINE: 1.025 (ref 1.005–1.030)

## 2018-02-17 MED ORDER — IBUPROFEN 400 MG PO TABS
400.0000 mg | ORAL_TABLET | Freq: Once | ORAL | Status: AC | PRN
  Administered 2018-02-17: 400 mg via ORAL
  Filled 2018-02-17: qty 1

## 2018-02-17 NOTE — ED Triage Notes (Addendum)
Pt having congestion and 1 episode of vomiting with diarrhea. Pt also states he feels like he has a UTI pt self caths at home

## 2018-02-18 ENCOUNTER — Other Ambulatory Visit: Payer: Self-pay

## 2018-02-18 MED ORDER — ONDANSETRON 4 MG PO TBDP: 4.0000 mg | ORAL_TABLET | Freq: Three times a day (TID) | ORAL | 0 refills | Status: AC | PRN

## 2018-02-18 NOTE — ED Provider Notes (Signed)
[facility] EMERGENCY DEPARTMENT Provider Note  CSN: [id number] Arrival date & time: 02/17/18 1651  Chief Complaint(s) No chief complaint on file.  HPI Riley Simmons is a 23 y.o. male with a past medical history of scoliosis, spina bifida with neurogenic bladder requiring self-catheterization who presents to the emergency department with 1 day of nausea, nonbloody nonbilious vomiting, diarrhea.  Patient also endorsing abdominal discomfort in the epigastrium, and left upper quadrant.  Denies any fevers or chills.  Endorses some chest congestion which has resolved.  States the last time he had any emesis was approximately 6 to 8 hours ago.  Since then he has been able to tolerate oral intake.  Denies any suspicious food intake, sick contacts, recent travel.  Patient is also concerned that he has a urinary tract infection due to the dark color of his urine.  HPI  Past Medical History Past Medical History:  Diagnosis Date  . Marijuana smoker, continuous   . Scoliosis   . Spina bifida ([place])   . Urethritis    Patient Active Problem List   Diagnosis Date Noted  . Spina bifida ([place]) 09/20/2017  . Neurogenic bladder 09/20/2017  . Leukocytosis   . Pain in right testicle   . Epididymitis 08/16/2017   Home Medication(s) Prior to Admission medications   Medication Sig Start Date End Date Taking? Authorizing Provider  acetaminophen (TYLENOL) 500 MG tablet Take 500 mg by mouth every 6 (six) hours as needed for mild pain or headache.    [provider]  Catheters KIT Coloplast 12 french Provide 30 self catheterization kits for PRN catheterization 07/31/16   Pisciotta, Elmyra Ricks, PA-C  cephALEXin (KEFLEX) 500 MG capsule Take 1 capsule (500 mg total) by mouth 2 (two) times daily. 12/16/17   Carlisle Cater, PA-C  ibuprofen (ADVIL,MOTRIN) 200 MG tablet Take 800 mg by mouth every 6 (six) hours as needed for moderate pain.    [provider]  ondansetron (ZOFRAN  ODT) 4 MG disintegrating tablet Take 1 tablet (4 mg total) by mouth every 8 (eight) hours as needed for up to 3 days for nausea or vomiting. 02/18/18 02/21/18  Fatima Blank, MD  oxybutynin (DITROPAN) 5 MG tablet Take 1 tablet (5 mg total) by mouth 2 (two) times daily. Patient not taking: Reported on 11/15/2017 09/20/17   Lanae Boast, [place]                                                                                                                                    Past Surgical History Past Surgical History:  Procedure Laterality Date  . BACK SURGERY     as a baby due to spina bifida    Family History No family history on file.  Social History Social History   Tobacco Use  . Smoking status: Current Some Day Smoker    Packs/day: 0.50    Types: Cigarettes  . Smokeless tobacco:  Never Used  Substance Use Topics  . Alcohol use: Not Currently  . Drug use: Yes    Types: Marijuana    Comment: daily   Allergies Levofloxacin; Amoxicillin; and Latex  Review of Systems Review of Systems All other systems are reviewed and are negative for acute change except as noted in the HPI  Physical Exam Vital Signs  I have reviewed the triage vital signs BP 122/86 (BP Location: Right Arm)   Pulse 83   Temp 97.6 F (36.4 C) (Oral)   Resp 16   Ht '5\' 5"'  (1.651 m)   Wt 61.2 kg   SpO2 97%   BMI 22.47 kg/m   Physical Exam Vitals signs reviewed.  Constitutional:      General: He is not in acute distress.    Appearance: He is well-developed. He is not diaphoretic.  HENT:     Head: Normocephalic and atraumatic.     Jaw: No trismus.     Right Ear: External ear normal.     Left Ear: External ear normal.     Nose: Nose normal.  Eyes:     General: No scleral icterus.    Conjunctiva/sclera: Conjunctivae normal.  Neck:     Musculoskeletal: Normal range of motion.     Trachea: Phonation normal.  Cardiovascular:     Rate and Rhythm: Normal rate and regular rhythm.  Pulmonary:      Effort: Pulmonary effort is normal. No respiratory distress.     Breath sounds: No stridor.  Abdominal:     General: There is no distension.     Tenderness: There is abdominal tenderness (mild discomfort) in the epigastric area and left upper quadrant. There is no guarding or rebound. Negative signs include Murphy's sign.  Musculoskeletal: Normal range of motion.  Neurological:     Mental Status: He is alert and oriented to person, place, and time.  Psychiatric:        Behavior: Behavior normal.     ED Results and Treatments Labs (all labs ordered are listed, but only abnormal results are displayed) Labs Reviewed  URINALYSIS, ROUTINE W REFLEX MICROSCOPIC - Abnormal; Notable for the following components:      Result Value   Ketones, ur 20 (*)    All other components within normal limits                                                                                                                         EKG  EKG Interpretation  Date/Time:    Ventricular Rate:    PR Interval:    QRS Duration:   QT Interval:    QTC Calculation:   R Axis:     Text Interpretation:        Radiology No results found. Pertinent labs & imaging results that were available during my care of the patient were reviewed by me and considered in my medical decision making (see chart for details).  Medications Ordered in ED Medications  ibuprofen (ADVIL,MOTRIN) tablet 400 mg (400 mg Oral Given 02/17/18 1823)                                                                                                                                    Procedures Procedures  (including critical care time)  Medical Decision Making / ED Course I have reviewed the nursing notes for this encounter and the patient's prior records (if available in EHR or on provided paperwork).    23 y.o. male presents with vomiting, diarrhea for 1 day. No possible suspicious food intake.  adequate oral tolerance. Rest of history as  above.  Patient appears well, not in distress, and with no signs of toxicity or dehydration. Abdomen with mild discomfort in the epigastrium and left upper quadrant without evidence of peritonitis.  Rest of the exam as above  Most consistent with viral gastroenteritis.   Doubt appendicitis, diverticulitis, severe colitis, dysentery.    UA without evidence of infection.  Able to tolerate oral intake in the ED.  Discussed symptomatic treatment with the patient and they will follow closely with their PCP.   Final Clinical Impression(s) / ED Diagnoses Final diagnoses:  Nausea vomiting and diarrhea   Disposition: Discharge  Condition: Good  I have discussed the results, Dx and Tx plan with the patient who expressed understanding and agree(s) with the plan. Discharge instructions discussed at great length. The patient was given strict return precautions who verbalized understanding of the instructions. No further questions at time of discharge.    ED Discharge Orders         Ordered    ondansetron (ZOFRAN ODT) 4 MG disintegrating tablet  Every 8 hours PRN     02/18/18 0358           Follow Up: Lanae Boast, FNP [place] [id number] [phone]  Schedule an appointment as soon as possible for a visit  As needed      This chart was dictated using voice recognition software.  Despite best efforts to proofread,  errors can occur which can change the documentation meaning.   Fatima Blank, MD 02/18/18 [phone]

## 2018-02-18 NOTE — ED Notes (Signed)
ED Provider at bedside. 

## 2018-03-25 ENCOUNTER — Ambulatory Visit (INDEPENDENT_AMBULATORY_CARE_PROVIDER_SITE_OTHER): Payer: Self-pay | Admitting: Family Medicine

## 2018-03-25 VITALS — BP 130/79 | HR 70 | Temp 98.7°F | Resp 16 | Wt 142.0 lb

## 2018-03-25 DIAGNOSIS — Z5989 Other problems related to housing and economic circumstances: Secondary | ICD-10-CM

## 2018-03-25 DIAGNOSIS — R8290 Unspecified abnormal findings in urine: Secondary | ICD-10-CM

## 2018-03-25 DIAGNOSIS — Z598 Other problems related to housing and economic circumstances: Secondary | ICD-10-CM

## 2018-03-25 DIAGNOSIS — R109 Unspecified abdominal pain: Secondary | ICD-10-CM

## 2018-03-25 DIAGNOSIS — N3000 Acute cystitis without hematuria: Secondary | ICD-10-CM

## 2018-03-25 LAB — POCT URINALYSIS DIPSTICK
Bilirubin, UA: NEGATIVE
Blood, UA: NEGATIVE
Glucose, UA: NEGATIVE
Ketones, UA: NEGATIVE
Nitrite, UA: POSITIVE
Protein, UA: NEGATIVE
Spec Grav, UA: 1.02 (ref 1.010–1.025)
Urobilinogen, UA: 0.2 E.U./dL
pH, UA: 6 (ref 5.0–8.0)

## 2018-03-25 MED ORDER — CEPHALEXIN 500 MG PO CAPS: 500.0000 mg | ORAL_CAPSULE | Freq: Two times a day (BID) | ORAL | 0 refills | Status: AC

## 2018-03-25 NOTE — Progress Notes (Signed)
Acute Office Visit  Subjective:    Patient ID: Riley Simmons, male    DOB: 09-07-1995, 23 y.o.   MRN: [id number]  Chief Complaint  Patient presents with  . Dysuria    and odor x 4 days     Urinary Tract Infection   This is a chronic problem. The current episode started in the past 7 days. The problem occurs every urination. The problem has been unchanged. The quality of the pain is described as burning. The pain is mild. There has been no fever. He is sexually active. There is a history of pyelonephritis. Pertinent negatives include no chills, discharge, flank pain or vomiting. He has tried nothing for the symptoms. His past medical history is significant for catheterization (self cath. reuses catheters due to lack of supplies).     Past Medical History:  Diagnosis Date  . Marijuana smoker, continuous   . Scoliosis   . Spina bifida ([place])   . Urethritis     Past Surgical History:  Procedure Laterality Date  . BACK SURGERY     as a baby due to spina bifida     No family history on file.  Social History   Socioeconomic History  . Marital status: Single    Spouse name: Not on file  . Number of children: Not on file  . Years of education: Not on file  . Highest education level: Not on file  Occupational History  . Not on file  Social Needs  . Financial resource strain: Not on file  . Food insecurity:    Worry: Not on file    Inability: Not on file  . Transportation needs:    Medical: Not on file    Non-medical: Not on file  Tobacco Use  . Smoking status: Current Some Day Smoker    Packs/day: 0.50    Types: Cigarettes  . Smokeless tobacco: Never Used  Substance and Sexual Activity  . Alcohol use: Not Currently  . Drug use: Yes    Types: Marijuana    Comment: daily  . Sexual activity: Not on file  Lifestyle  . Physical activity:    Days per week: Not on file    Minutes per session: Not on file  . Stress: Not on file  Relationships  . Social  connections:    Talks on phone: Not on file    Gets together: Not on file    Attends religious service: Not on file    Active member of club or organization: Not on file    Attends meetings of clubs or organizations: Not on file    Relationship status: Not on file  . Intimate partner violence:    Fear of current or ex partner: Not on file    Emotionally abused: Not on file    Physically abused: Not on file    Forced sexual activity: Not on file  Other Topics Concern  . Not on file  Social History Narrative  . Not on file    Outpatient Medications Prior to Visit  Medication Sig Dispense Refill  . Catheters KIT Coloplast 12 french Provide 30 self catheterization kits for PRN catheterization 30 each 0  . acetaminophen (TYLENOL) 500 MG tablet Take 500 mg by mouth every 6 (six) hours as needed for mild pain or headache.    . ibuprofen (ADVIL,MOTRIN) 200 MG tablet Take 800 mg by mouth every 6 (six) hours as needed for moderate pain.    Marland Kitchen oxybutynin (DITROPAN)  5 MG tablet Take 1 tablet (5 mg total) by mouth 2 (two) times daily. (Patient not taking: Reported on 11/15/2017) 60 tablet 2  . cephALEXin (KEFLEX) 500 MG capsule Take 1 capsule (500 mg total) by mouth 2 (two) times daily. 20 capsule 0   No facility-administered medications prior to visit.     Allergies  Allergen Reactions  . Levofloxacin Anxiety, Hives and Swelling  . Amoxicillin Hives and Rash  . Latex Rash    Review of Systems  Constitutional: Negative.  Negative for chills.  HENT: Negative.   Eyes: Negative.   Respiratory: Negative.   Cardiovascular: Negative.   Gastrointestinal: Negative.  Negative for vomiting.  Genitourinary: Negative.  Negative for flank pain.  Musculoskeletal: Negative.   Skin: Negative.   Neurological: Negative.   Psychiatric/Behavioral: Negative.        Objective:    Physical Exam  Constitutional: He is oriented to person, place, and time. He appears well-developed and well-nourished.  No distress.  HENT:  Head: Normocephalic and atraumatic.  Eyes: Pupils are equal, round, and reactive to light. Conjunctivae and EOM are normal.  Neck: Normal range of motion.  Cardiovascular: Normal rate, regular rhythm and normal heart sounds.  Pulmonary/Chest: Effort normal and breath sounds normal. No respiratory distress.  Musculoskeletal: Normal range of motion.  Neurological: He is alert and oriented to person, place, and time.  Skin: Skin is warm and dry.  Psychiatric: He has a normal mood and affect. His behavior is normal. Judgment and thought content normal.  Nursing note and vitals reviewed.   BP 130/79 (BP Location: Left Arm, Patient Position: Sitting, Cuff Size: Normal)   Pulse 70   Temp 98.7 F (37.1 C) (Oral)   Resp 16   Wt 142 lb (64.4 kg)   SpO2 99%   BMI 23.63 kg/m  Wt Readings from Last 3 Encounters:  03/25/18 142 lb (64.4 kg)  02/17/18 135 lb (61.2 kg)  12/16/17 135 lb (61.2 kg)      Assessment & Plan:   Problem List Items Addressed This Visit    None    Visit Diagnoses    Flank pain    -  Primary   Relevant Orders   Urinalysis Dipstick (Completed)   Urine Culture (Completed)   Abnormal urinalysis       Relevant Orders   Urine Culture (Completed)   Acute cystitis without hematuria       Uninsured       Relevant Orders   Ambulatory referral to Social Work       Meds ordered this encounter  Medications  . cephALEXin (KEFLEX) 500 MG capsule    Sig: Take 1 capsule (500 mg total) by mouth 2 (two) times daily for 10 days.    Dispense:  20 capsule    Refill:  0   Patient needs assistance for catheters. Advised patient to apply for orange card and cone discount. He currently re-uses catheters and has had frequent cystitis. Will refer to social worker for further help with catheters. He currently reports using  coloplast 12 French catheters.  Lanae Boast, FNP

## 2018-03-25 NOTE — Patient Instructions (Signed)
Urinary Tract Infection, Adult A urinary tract infection (UTI) is an infection of any part of the urinary tract. The urinary tract includes:  The kidneys.  The ureters.  The bladder.  The urethra. These organs make, store, and get rid of pee (urine) in the body. What are the causes? This is caused by germs (bacteria) in your genital area. These germs grow and cause swelling (inflammation) of your urinary tract. What increases the risk? You are more likely to develop this condition if:  You have a small, thin tube (catheter) to drain pee.  You cannot control when you pee or poop (incontinence).  You are male, and: ? You use these methods to prevent pregnancy: ? A medicine that kills sperm (spermicide). ? A device that blocks sperm (diaphragm). ? You have low levels of a male hormone (estrogen). ? You are pregnant.  You have genes that add to your risk.  You are sexually active.  You take antibiotic medicines.  You have trouble peeing because of: ? A prostate that is bigger than normal, if you are male. ? A blockage in the part of your body that drains pee from the bladder (urethra). ? A kidney stone. ? A nerve condition that affects your bladder (neurogenic bladder). ? Not getting enough to drink. ? Not peeing often enough.  You have other conditions, such as: ? Diabetes. ? A weak disease-fighting system (immune system). ? Sickle cell disease. ? Gout. ? Injury of the spine. What are the signs or symptoms? Symptoms of this condition include:  Needing to pee right away (urgently).  Peeing often.  Peeing small amounts often.  Pain or burning when peeing.  Blood in the pee.  Pee that smells bad or not like normal.  Trouble peeing.  Pee that is cloudy.  Fluid coming from the vagina, if you are male.  Pain in the belly or lower back. Other symptoms include:  Throwing up (vomiting).  No urge to eat.  Feeling mixed up (confused).  Being tired  and grouchy (irritable).  A fever.  Watery poop (diarrhea). How is this treated? This condition may be treated with:  Antibiotic medicine.  Other medicines.  Drinking enough water. Follow these instructions at home:  Medicines  Take over-the-counter and prescription medicines only as told by your doctor.  If you were prescribed an antibiotic medicine, take it as told by your doctor. Do not stop taking it even if you start to feel better. General instructions  Make sure you: ? Pee until your bladder is empty. ? Do not hold pee for a long time. ? Empty your bladder after sex. ? Wipe from front to back after pooping if you are a male. Use each tissue one time when you wipe.  Drink enough fluid to keep your pee pale yellow.  Keep all follow-up visits as told by your doctor. This is important. Contact a doctor if:  You do not get better after 1-2 days.  Your symptoms go away and then come back. Get help right away if:  You have very bad back pain.  You have very bad pain in your lower belly.  You have a fever.  You are sick to your stomach (nauseous).  You are throwing up. Summary  A urinary tract infection (UTI) is an infection of any part of the urinary tract.  This condition is caused by germs in your genital area.  There are many risk factors for a UTI. These include having a small, thin   tube to drain pee and not being able to control when you pee or poop.  Treatment includes antibiotic medicines for germs.  Drink enough fluid to keep your pee pale yellow. This information is not intended to replace advice given to you by your health care provider. Make sure you discuss any questions you have with your health care provider. Document Released: 07/22/2007 Document Revised: 08/12/2017 Document Reviewed: 08/12/2017 Elsevier Interactive Patient Education  2019 Elsevier Inc.  

## 2018-03-27 LAB — URINE CULTURE

## 2018-04-06 ENCOUNTER — Inpatient Hospital Stay
Admit: 2018-04-06 | Discharge: 2018-04-06 | Disposition: A | Discharge status: Home / Self Care | Payer: Self-pay | Attending: Emergency Medicine

## 2018-04-06 DIAGNOSIS — K0889 Other specified disorders of teeth and supporting structures: Secondary | ICD-10-CM

## 2018-04-06 MED ORDER — ALUMINUM-MAGNESIUM HYDROXIDE 200 MG-200 MG/5 ML ORAL SUSP
200-2005 mg/5 mL | Freq: Two times a day (BID) | ORAL | 0 refills | Status: AC
Start: 2018-04-06 — End: ?

## 2018-04-06 MED ORDER — ACETAMINOPHEN 325 MG TABLET
325 mg | ORAL_TABLET | Freq: Four times a day (QID) | ORAL | 0 refills | Status: AC | PRN
Start: 2018-04-06 — End: ?

## 2018-04-06 MED ORDER — IBUPROFEN 600 MG TAB
600 mg | ORAL_TABLET | Freq: Four times a day (QID) | ORAL | 0 refills | Status: AC | PRN
Start: 2018-04-06 — End: ?

## 2018-04-06 MED ORDER — ACETAMINOPHEN 500 MG TAB
500 mg | Freq: Once | ORAL | Status: AC
Start: 2018-04-06 — End: 2018-04-06
  Administered 2018-04-06: 22:00:00 via ORAL

## 2018-04-06 MED FILL — MAPAP EXTRA STRENGTH 500 MG TABLET: 500 mg | ORAL | Qty: 2

## 2018-04-06 NOTE — ED Provider Notes (Signed)
ED Provider Notes by Meade Maw at 04/06/18 1613                Author: Meade Maw  Service: Emergency Medicine  Author Type: Physician Assistant       Filed: 04/06/18 1647  Date of Service: 04/06/18 1613  Status: Attested           Editor: Meade Maw  Cosigner: Erline Hau, MD at 04/07/18 1514          Attestation signed by Erline Hau, MD at 04/07/18 1514          I was present in the department for any questions or concerns during the time of visit of the patient. However, I did not see this patient as there were no  reported concerns with this patient unless otherwise noted. If I did see patient, then note was created the day of visit.                                    EMERGENCY DEPARTMENT HISTORY AND PHYSICAL EXAM      4:13 PM         Date: 04/06/2018   Patient Name: Ruben Fields        History of Presenting Illness          Chief Complaint       Patient presents with        ?  Dental Pain              History Provided By: Patient      Additional History (Context): Ramaj Frangos  is a 23 y.o. male with  No significant past medical history who presents with complaints of dental pain for the last 2 days.  Patient states that he can visualize a broken tooth on left lower side.  States he is tried to call  dentist in the area but no one is able to see him.  Patient states he is here from out of town on work.  Patient denies any fevers or chills.      PCP: None              Past History        Past Medical History:   History reviewed. No pertinent past medical history.      Past Surgical History:   History reviewed. No pertinent surgical history.      Family History:   History reviewed. No pertinent family history.      Social History:     Social History          Tobacco Use         ?  Smoking status:  Never Smoker       Substance Use Topics         ?  Alcohol use:  Never              Frequency:  Never         ?  Drug use:  Not on file           Allergies:     Allergies         Allergen  Reactions         ?  Amoxicillin  Hives         ?  Levofloxacin  Hives  Review of Systems           Review of Systems    Constitutional: Negative.     HENT: Positive for dental problem.     Respiratory: Negative.     Cardiovascular: Negative.     Gastrointestinal: Negative.     Genitourinary: Negative.     Musculoskeletal: Negative.     Neurological: Negative.     All other systems reviewed and are negative.              Physical Exam        Visit Vitals      BP  (!) 140/103 (BP 1 Location: Right arm, BP Patient Position: At rest)     Pulse  98     Temp  97.9 ??F (36.6 ??C)     Resp  18        SpO2  100%              Physical Exam   Vitals signs reviewed.   Constitutional:        General: He is not in acute distress.     Appearance: Normal appearance. He is not ill-appearing, toxic-appearing or diaphoretic.    HENT:       Head: Normocephalic and atraumatic.      Right Ear: External ear normal.      Left Ear: External ear normal.      Nose: Nose normal.      Mouth/Throat:      Mouth: Mucous membranes are moist.      Dentition:  Abnormal dentition.      Pharynx: Oropharynx is clear. No oropharyngeal exudate or posterior oropharyngeal erythema.          Comments: Tooth #19 broken on the anterior lateral aspect.  No evidence of gingival erythema, no gingival fluctuance or abscess.   Neck:       Musculoskeletal: Neck supple.   Cardiovascular :       Rate and Rhythm: Normal rate and regular rhythm.      Pulses: Normal pulses.      Heart sounds: Normal heart sounds. No murmur. No gallop.    Pulmonary:       Effort: Pulmonary effort is normal.      Breath sounds: Normal breath sounds. No wheezing, rhonchi or rales.    Skin:      General: Skin is warm and dry.      Capillary Refill: Capillary refill takes less than 2 seconds.    Neurological:       General: No focal deficit present.      Mental Status: He is alert and oriented to person, place, and time.      Cranial Nerves: No cranial nerve  deficit.      Sensory: No sensory deficit.      Motor: No weakness.                 Diagnostic Study Results        Labs -   No results found for this or any previous visit (from the past 12 hour(s)).      Radiologic Studies -      No orders to display                Medical Decision Making     I am the first provider for this patient.      I reviewed the vital signs, available nursing notes, past medical history, past surgical history, family  history and social history.      Vital Signs-Reviewed the patient's vital signs.      Records Reviewed: Nursing Notes and Old Medical Records (Time of Review: 4:13 PM)      ED Course: Progress Notes, Reevaluation, and Consults:   4:13 PM met with patient, reviewed history, performed physical exam.  Physical exam suggestive of left lower broken tooth, tooth #19 broken on the anterior lateral aspect.      Provider Notes (Medical Decision Making):    23 year old male seen in the emergency department for complaints of left lower dental pain.  Physical exam reveals broken tooth #19.  No evidence of gingival infection, no evidence of gingival fluctuance or abscess.  Patient will be treated symptomatically  with Magic mouthwash, Tylenol, ibuprofen.  Patient vies follow-up with his primary care provider.  Patient given information for dental clinics in the area.  Patient advised to return immediately to the emergency department if symptoms worsen.        Diagnosis        Clinical Impression:       1.  Pain, dental            Disposition: home         Follow-up Information               Follow up With  Specialties  Details  Why  [facility]    Call in 1 day  For follow up regarding ER visit.  [facility]              [place]    Call in 1 day  For follow up regarding ER visit.  [place]              [facility] EMERGENCY DEPT  Emergency Medicine     Immediately if symptoms worsen.  [place]   [phone]                   Discharge Medication List as of 04/06/2018  4:39 PM              START taking these medications          Details        aluminum-magnesium hydroxide 200-200 mg/5 mL susp 5 mL, diphenhydrAMINE 12.5 mg/5 mL liqd 12.5 mg, lidocaine 2 % soln 5 mL  5 mL by Swish and Spit route two (2) times a day. Magic mouth wash    Maalox   Lidocaine 2% viscous    Diphenhydramine oral solution       Pharmacy to mix equal portions of ingredients to a total volume as indicated in the dispense amount., Print, Disp-250 mL , R-0               acetaminophen (TYLENOL) 325 mg tablet  Take 2 Tabs by mouth every six (6) hours as needed for Pain., Print, Disp-20 Tab, R-0               ibuprofen (MOTRIN) 600 mg tablet  Take 1 Tab by mouth every six (6) hours as needed for Pain., Print, Disp-20 Tab, R-0  Meade Maw, PA-C      Dictation disclaimer:  Please note that this dictation was completed with Dragon, the computer voice recognition software.  Quite often unanticipated grammatical, syntax, homophones, and other  interpretive errors are inadvertently transcribed by the computer software.  Please disregard these errors.  Please excuse any errors that have escaped final proofreading.

## 2018-04-06 NOTE — ED Notes (Signed)
Pt left w/o paperwork.

## 2018-04-06 NOTE — ED Notes (Signed)
Patient returned, requesting tylenol and paperwork.Patient given tylenol and discharged at this time. I have reviewed discharge instructions with the patient and/or family member. The patient verbalized understanding. Patient armband removed and shredded. VSS. Patient verbalized understanding of how to properly take any prescribed medications.

## 2018-04-06 NOTE — ED Notes (Signed)
Left lower dental pain. No dentist at this time

## 2018-04-06 NOTE — ED Provider Notes (Signed)
EMERGENCY DEPARTMENT HISTORY AND PHYSICAL EXAM    4:13 PM      Date: 04/06/2018  Patient Name: Ruben Fields    History of Presenting Illness     Chief Complaint   Patient presents with   ??? Dental Pain         History Provided By: Patient    Additional History (Context): Ruben Fields is a 23 y.o. male with No significant past medical history who presents with complaints of dental pain for the last 2 days.  Patient states that he can visualize a broken tooth on left lower side.  States he is tried to call dentist in the area but no one is able to see him.  Patient states he is here from out of town on work.  Patient denies any fevers or chills.    PCP: None        Past History     Past Medical History:  History reviewed. No pertinent past medical history.    Past Surgical History:  History reviewed. No pertinent surgical history.    Family History:  History reviewed. No pertinent family history.    Social History:  Social History     Tobacco Use   ??? Smoking status: Never Smoker   Substance Use Topics   ??? Alcohol use: Never     Frequency: Never   ??? Drug use: Not on file       Allergies:  Allergies   Allergen Reactions   ??? Amoxicillin Hives   ??? Levofloxacin Hives         Review of Systems       Review of Systems   Constitutional: Negative.    HENT: Positive for dental problem.    Respiratory: Negative.    Cardiovascular: Negative.    Gastrointestinal: Negative.    Genitourinary: Negative.    Musculoskeletal: Negative.    Neurological: Negative.    All other systems reviewed and are negative.        Physical Exam     Visit Vitals  BP (!) 140/103 (BP 1 Location: Right arm, BP Patient Position: At rest)   Pulse 98   Temp 97.9 ??F (36.6 ??C)   Resp 18   SpO2 100%         Physical Exam  Vitals signs reviewed.   Constitutional:       General: He is not in acute distress.     Appearance: Normal appearance. He is not ill-appearing, toxic-appearing or diaphoretic.   HENT:      Head: Normocephalic and atraumatic.       Right Ear: External ear normal.      Left Ear: External ear normal.      Nose: Nose normal.      Mouth/Throat:      Mouth: Mucous membranes are moist.      Dentition: Abnormal dentition.      Pharynx: Oropharynx is clear. No oropharyngeal exudate or posterior oropharyngeal erythema.        Comments: Tooth #19 broken on the anterior lateral aspect.  No evidence of gingival erythema, no gingival fluctuance or abscess.  Neck:      Musculoskeletal: Neck supple.   Cardiovascular:      Rate and Rhythm: Normal rate and regular rhythm.      Pulses: Normal pulses.      Heart sounds: Normal heart sounds. No murmur. No gallop.    Pulmonary:      Effort: Pulmonary effort is normal.  Breath sounds: Normal breath sounds. No wheezing, rhonchi or rales.   Skin:     General: Skin is warm and dry.      Capillary Refill: Capillary refill takes less than 2 seconds.   Neurological:      General: No focal deficit present.      Mental Status: He is alert and oriented to person, place, and time.      Cranial Nerves: No cranial nerve deficit.      Sensory: No sensory deficit.      Motor: No weakness.           Diagnostic Study Results     Labs -  No results found for this or any previous visit (from the past 12 hour(s)).    Radiologic Studies -   No orders to display         Medical Decision Making   I am the first provider for this patient.    I reviewed the vital signs, available nursing notes, past medical history, past surgical history, family history and social history.    Vital Signs-Reviewed the patient's vital signs.    Records Reviewed: Nursing Notes and Old Medical Records (Time of Review: 4:13 PM)    ED Course: Progress Notes, Reevaluation, and Consults:  4:13 PM met with patient, reviewed history, performed physical exam.  Physical exam suggestive of left lower broken tooth, tooth #19 broken on the anterior lateral aspect.    Provider Notes (Medical Decision Making):    23 year old male seen in the emergency department for complaints of left lower dental pain.  Physical exam reveals broken tooth #19.  No evidence of gingival infection, no evidence of gingival fluctuance or abscess.  Patient will be treated symptomatically with Magic mouthwash, Tylenol, ibuprofen.  Patient vies follow-up with his primary care provider.  Patient given information for dental clinics in the area.  Patient advised to return immediately to the emergency department if symptoms worsen.    Diagnosis     Clinical Impression:   1. Pain, dental        Disposition: home     Follow-up Information     Follow up With Specialties Details [facility]  Call in 1 day For follow up regarding ER visit. [place]    [place]  Call in 1 day For follow up regarding ER visit. [place]    [facility] EMERGENCY DEPT Emergency Medicine  Immediately if symptoms worsen. Ruben Fields  [phone]           Discharge Medication List as of 04/06/2018  4:39 PM      START taking these medications    Details   aluminum-magnesium hydroxide 200-200 mg/5 mL susp 5 mL, diphenhydrAMINE 12.5 mg/5 mL liqd 12.5 mg, lidocaine 2 % soln 5 mL 5 mL by Swish and Spit route two (2) times a day. Magic mouth wash   Maalox  Lidocaine 2% viscous   Diphenhydramine oral solution     Pharmacy to mix equal portions of ingredients to a total volume as indicated in the dispense amount., Print, Disp-250 mL , R-0      acetaminophen (TYLENOL) 325 mg tablet Take 2 Tabs by mouth every six (6) hours as needed for Pain., Print, Disp-20 Tab, R-0      ibuprofen (MOTRIN) 600 mg tablet Take  1 Tab by mouth every six (6) hours as needed for Pain., Print, Disp-20 Tab, R-0           Meade Maw, PA-C    Dictation disclaimer:  Please note that this dictation was completed with  Dragon, the computer voice recognition software.  Quite often unanticipated grammatical, syntax, homophones, and other interpretive errors are inadvertently transcribed by the computer software.  Please disregard these errors.  Please excuse any errors that have escaped final proofreading.

## 2018-04-06 NOTE — ED Notes (Signed)
Patient returned, requesting tylenol and paperwork.Patient given tylenol and discharged at this time. I have reviewed discharge instructions with the patient and/or family member. The patient verbalized understanding. Patient armband removed and shredded. VSS. Patient verbalized understanding of how to properly take any prescribed medications.

## 2018-04-06 NOTE — ED Notes (Signed)
Pt left w/o paperwork

## 2018-04-06 NOTE — ED Triage Notes (Signed)
Left lower dental pain. No dentist at this time

## 2018-06-09 ENCOUNTER — Other Ambulatory Visit: Payer: Self-pay

## 2018-06-09 ENCOUNTER — Ambulatory Visit (INDEPENDENT_AMBULATORY_CARE_PROVIDER_SITE_OTHER): Payer: Self-pay | Admitting: Family Medicine

## 2018-06-09 ENCOUNTER — Encounter: Payer: Self-pay | Admitting: Family Medicine

## 2018-06-09 VITALS — BP 136/87 | HR 110 | Temp 98.6°F | Resp 14 | Ht 65.0 in | Wt 145.0 lb

## 2018-06-09 DIAGNOSIS — Q057 Lumbar spina bifida without hydrocephalus: Secondary | ICD-10-CM

## 2018-06-09 DIAGNOSIS — N319 Neuromuscular dysfunction of bladder, unspecified: Secondary | ICD-10-CM

## 2018-06-09 DIAGNOSIS — R300 Dysuria: Secondary | ICD-10-CM

## 2018-06-09 LAB — POCT URINALYSIS DIPSTICK
Bilirubin, UA: NEGATIVE
Blood, UA: NEGATIVE
Glucose, UA: NEGATIVE
Ketones, UA: NEGATIVE
Nitrite, UA: NEGATIVE
Protein, UA: NEGATIVE
Spec Grav, UA: 1.025 (ref 1.010–1.025)
Urobilinogen, UA: 0.2 E.U./dL
pH, UA: 6 (ref 5.0–8.0)

## 2018-06-09 MED ORDER — NITROFURANTOIN MONOHYD MACRO 100 MG PO CAPS: 100.0000 mg | ORAL_CAPSULE | Freq: Every day | ORAL | 1 refills | Status: DC

## 2018-06-09 MED ORDER — SULFAMETHOXAZOLE-TRIMETHOPRIM 800-160 MG PO TABS: 1.0000 | ORAL_TABLET | Freq: Two times a day (BID) | ORAL | 0 refills | Status: DC

## 2018-06-09 MED ORDER — CEPHALEXIN 500 MG PO CAPS: 500.0000 mg | ORAL_CAPSULE | Freq: Two times a day (BID) | ORAL | 0 refills | Status: AC

## 2018-06-09 NOTE — Progress Notes (Signed)
[facility]   Progress Note: Sick Visit Provider: Mike Gip, FNP  SUBJECTIVE:   Riley Simmons is a 23 y.o. male who  has a past medical history of Marijuana smoker, continuous, Scoliosis, Spina bifida (HCC), and Urethritis.. Patient presents today for Urinary Frequency and Dysuria Patient with a history of Spina Bifida and neurogenic bladder. He has been seen in the past for similar symptoms. He states that he is re-using his catheters due to cost. He report urinary frequency and burning with urination. Denies fever, chills, back pain or night sweats.   ROS   OBJECTIVE: BP 136/87 (BP Location: Right Arm, Patient Position: Sitting, Cuff Size: Normal)   Pulse (!) 110   Temp 98.6 F (37 C) (Oral)   Resp 14   Ht 5\' 5"  (1.651 m)   Wt 145 lb (65.8 kg)   SpO2 98%   BMI 24.13 kg/m   Wt Readings from Last 3 Encounters:  06/09/18 145 lb (65.8 kg)  03/25/18 142 lb (64.4 kg)  02/17/18 135 lb (61.2 kg)     Physical Exam Vitals signs and nursing note reviewed.  Constitutional:      General: He is not in acute distress.    Appearance: Normal appearance.  HENT:     Head: Normocephalic and atraumatic.  Eyes:     Extraocular Movements: Extraocular movements intact.     Conjunctiva/sclera: Conjunctivae normal.     Pupils: Pupils are equal, round, and reactive to light.  Cardiovascular:     Rate and Rhythm: Normal rate and regular rhythm.     Heart sounds: No murmur.  Pulmonary:     Effort: Pulmonary effort is normal.     Breath sounds: Normal breath sounds.  Musculoskeletal: Normal range of motion.  Skin:    General: Skin is warm and dry.  Neurological:     Mental Status: He is alert and oriented to person, place, and time.  Psychiatric:        Mood and Affect: Mood normal.        Behavior: Behavior normal.        Thought Content: Thought content normal.        Judgment: Judgment normal.     ASSESSMENT/PLAN:   1.  Dysuria Patient requesting to be on daily preventative due to frequency of cystitis. He is reusing his catheters due to cost.  - Urinalysis Dipstick - Urine Culture - nitrofurantoin, macrocrystal-monohydrate, (MACROBID) 100 MG capsule; Take 1 capsule (100 mg total) by mouth daily.  Dispense: 30 capsule; Refill: 1  2. Neurogenic bladder - nitrofurantoin, macrocrystal-monohydrate, (MACROBID) 100 MG capsule; Take 1 capsule (100 mg total) by mouth daily.  Dispense: 30 capsule; Refill: 1 - cephALEXin (KEFLEX) 500 MG capsule; Take 1 capsule (500 mg total) by mouth 2 (two) times daily for 7 days.  Dispense: 14 capsule; Refill: 0   3. Spina bifida of lumbar region, unspecified hydrocephalus presence (HCC) Discussed catheter assistance with social worker, Abigail Butts CSW.      The patient was given clear instructions to go to ER or return to medical center if symptoms do not improve, worsen or new problems develop. The patient verbalized understanding and agreed with plan of care.   Ms. Freda Jackson. Riley Lam, FNP-BC [facility] [address] [place], [place] [address] [phone]     This note has been created with Dragon speech recognition software and smart phrase technology. Any transcriptional errors  are unintentional.

## 2018-06-09 NOTE — Patient Instructions (Signed)
Urinary Tract Infection, Adult A urinary tract infection (UTI) is an infection of any part of the urinary tract. The urinary tract includes:  The kidneys.  The ureters.  The bladder.  The urethra. These organs make, store, and get rid of pee (urine) in the body. What are the causes? This is caused by germs (bacteria) in your genital area. These germs grow and cause swelling (inflammation) of your urinary tract. What increases the risk? You are more likely to develop this condition if:  You have a small, thin tube (catheter) to drain pee.  You cannot control when you pee or poop (incontinence).  You are male, and: ? You use these methods to prevent pregnancy: ? A medicine that kills sperm (spermicide). ? A device that blocks sperm (diaphragm). ? You have low levels of a male hormone (estrogen). ? You are pregnant.  You have genes that add to your risk.  You are sexually active.  You take antibiotic medicines.  You have trouble peeing because of: ? A prostate that is bigger than normal, if you are male. ? A blockage in the part of your body that drains pee from the bladder (urethra). ? A kidney stone. ? A nerve condition that affects your bladder (neurogenic bladder). ? Not getting enough to drink. ? Not peeing often enough.  You have other conditions, such as: ? Diabetes. ? A weak disease-fighting system (immune system). ? Sickle cell disease. ? Gout. ? Injury of the spine. What are the signs or symptoms? Symptoms of this condition include:  Needing to pee right away (urgently).  Peeing often.  Peeing small amounts often.  Pain or burning when peeing.  Blood in the pee.  Pee that smells bad or not like normal.  Trouble peeing.  Pee that is cloudy.  Fluid coming from the vagina, if you are male.  Pain in the belly or lower back. Other symptoms include:  Throwing up (vomiting).  No urge to eat.  Feeling mixed up (confused).  Being tired  and grouchy (irritable).  A fever.  Watery poop (diarrhea). How is this treated? This condition may be treated with:  Antibiotic medicine.  Other medicines.  Drinking enough water. Follow these instructions at home:  Medicines  Take over-the-counter and prescription medicines only as told by your doctor.  If you were prescribed an antibiotic medicine, take it as told by your doctor. Do not stop taking it even if you start to feel better. General instructions  Make sure you: ? Pee until your bladder is empty. ? Do not hold pee for a long time. ? Empty your bladder after sex. ? Wipe from front to back after pooping if you are a male. Use each tissue one time when you wipe.  Drink enough fluid to keep your pee pale yellow.  Keep all follow-up visits as told by your doctor. This is important. Contact a doctor if:  You do not get better after 1-2 days.  Your symptoms go away and then come back. Get help right away if:  You have very bad back pain.  You have very bad pain in your lower belly.  You have a fever.  You are sick to your stomach (nauseous).  You are throwing up. Summary  A urinary tract infection (UTI) is an infection of any part of the urinary tract.  This condition is caused by germs in your genital area.  There are many risk factors for a UTI. These include having a small, thin  tube to drain pee and not being able to control when you pee or poop.  Treatment includes antibiotic medicines for germs.  Drink enough fluid to keep your pee pale yellow. This information is not intended to replace advice given to you by your health care provider. Make sure you discuss any questions you have with your health care provider. Document Released: 07/22/2007 Document Revised: 08/12/2017 Document Reviewed: 08/12/2017 Elsevier Interactive Patient Education  2019 [employer]. Neurogenic Bladder  Neurogenic bladder is a bladder control disorder. It is usually  caused by problems with the nerves that control the bladder. Your brain sends signals through your spinal cord to the muscles in your bladder that start and stop urine flow. If you have neurogenic bladder, the nerves and muscles do not work together the way they should. This condition may make the bladder overactive, meaning you have trouble holding urine. In other cases, it may make the bladder underactive, meaning you have trouble passing urine. What are the causes? This condition may be caused by any kind of nerve damage or condition that disrupts the signals from your brain to your bladder. Many things can cause these nerve problems, including:  A disease that affects the nervous system, such as: ? Alzheimer disease. ? Cerebral palsy. ? Multiple sclerosis. ? Diabetes. ? Parkinson disease.  Damage to your brain or spinal cord. This can come from: ? Trauma. ? Tumors. ? Infection. ? Surgery. ? Alcohol abuse. ? Stroke. ? A congenital disability that affects the spinal cord. What increases the risk? You are more likely to develop this condition if you have nerve damage or a nerve disorder. What are the signs or symptoms? Signs and symptoms of this condition include:  Leaking or gushing urine (incontinence).  A sudden, strong urge to pass urine (urgency).  Frequent urination during the day and night.  Being unable to empty your bladder completely (urinary retention).  Frequent urinary tract infections. How is this diagnosed? This condition may be diagnosed based on:  Your symptoms and medical history.  A physical exam.  Results of a bladder diary. You may be asked to keep a record of your bladder symptoms and the times that you urinate. You may also have tests, such as:  A urine test to check for infection.  A bladder scan after you urinate to see how much urine is left in your bladder.  Tests to measure your urine flow and see how well the flow is controlled (urodynamic  tests).  A procedure that uses a small device with a camera to look through your urethra into your bladder (cystoscopy). A health care provider who specializes in the urinary tract (urologist) may do this test.  Imaging tests of your brain or spine, such as MRI or CT. How is this treated? Treatment for this condition depends on the cause and the symptoms that you have. Work closely with your health care provider to find the treatments that will improve your quality of life. Treatment options include:  Learning ways to control when you urinate, such as: ? Urinating at scheduled times. ? Training yourself to delay urination. ? Doing exercises to strengthen the muscles that control urine flow (Kegel exercises). ? Avoiding foods or drinks that make your symptoms worse.  Taking medicines to: ? Stimulate an underactive bladder. ? Relax an overactive bladder. ? Treat a urinary tract infection.  Learning how to use a thin tube (catheter) to empty your bladder. A catheter is a hollow tube that you pass  through your urethra.  Procedures to stimulate the nerves that control your bladder.  Surgery, if other treatments do not help. Follow these instructions at home: Lifestyle  Keep a bladder diary to find out which foods, liquids, or activities make your symptoms worse.  Use your bladder diary to schedule bathroom trips. If you are away from home, plan to be near a bathroom when your schedule says you will need one.  Limit your drinking of beverages that stimulate urination. These include soda, coffee, and tea.  After urinating, wait a few minutes and try again (double voiding).  Make sure you urinate just before you leave the house and just before you go to bed. Kegel exercises Do Kegel exercises to strengthen the muscles that control the passing of urine. These muscles are the ones you use to try to hold urine when you need to urinate. To do Kegel exercises: 1. Squeeze your pelvic floor  muscles tight, as if you are trying to stop the flow of urine. You should feel a tight lift in your rectal area. If you are male, you should also feel a tightness in your vaginal area. Keep your stomach, buttocks, and legs relaxed. 2. Hold the muscles tight for 5-10 seconds. 3. Relax your muscles for the same amount of time. 4. Repeat 10 times. Repeat this exercise 3 times a day or as many times as told by your health care provider. General instructions  Take over-the-counter and prescription medicines only as told by your health care provider.  Keep all follow-up visits as told by your health care provider. This is important. Contact a health care provider if:  You are having a hard time controlling your symptoms.  Your symptoms are getting worse.  You have signs of a urinary tract infection. These may include: ? A burning feeling when you urinate. ? Chills. ? Fever. Get help right away if:  You cannot pass urine. Summary  Neurogenic bladder is a bladder control disorder caused by problems with the nerves that control the bladder. This condition may make the bladder overactive or underactive.  This condition may be caused by any kind of nerve damage or condition that disrupts the signals from your brain to your bladder.  Treatment depends on the cause of your neurogenic bladder and the symptoms that you have. Work closely with your health care provider to find the treatments that will improve your quality of life. This information is not intended to replace advice given to you by your health care provider. Make sure you discuss any questions you have with your health care provider. Document Released: 08/16/2006 Document Revised: 02/15/2017 Document Reviewed: 02/15/2017 Elsevier Interactive Patient Education  2019 [employer]

## 2018-06-09 NOTE — Progress Notes (Signed)
CSW met with patient in exam room to discuss access to catheter supplies. Patient has difficulty affording catheters to use at home. Patient is uninsured and may not qualify for Medicaid due to income from employment.   CSW contacted [facility], and they indicated they can provide a temporary supply of free samples and discuss low cost options with patient. Will need patient's name and contact information.  Discussed with patient on the phone after office visit, and patient consented verbally to release phone number to [facility].   CSW will coordinate connecting patient to [facility] to assist with catheter supplies.

## 2018-06-12 LAB — URINE CULTURE

## 2018-06-23 ENCOUNTER — Telehealth: Payer: Self-pay

## 2018-06-23 NOTE — Telephone Encounter (Signed)
Patient called and states he lost his rx for Keflex and was unable to finish the course. He is asking if more of this can be sent in? He has chronic UTI's and keflex was prescribed on 06/16/2018. Please advise. Thanks!

## 2018-06-23 NOTE — Telephone Encounter (Signed)
Patient has been made aware and was advised Debby Bud will not be in office until mid next week. Thanks!

## 2018-07-12 IMAGING — US US SCROTUM W/ DOPPLER COMPLETE
1 series · 14 of 25 positions shown · non-contrast
Comparison: Ultrasound dated 07/22/2017

CLINICAL DATA: Right testicle pain and swelling. Epididymitis
diagnosed on 07/22/2017.

EXAM:
SCROTAL ULTRASOUND
DOPPLER ULTRASOUND OF THE TESTICLES
TECHNIQUE: Complete ultrasound examination of the testicles, epididymis, and
other scrotal structures was performed. Color and spectral Doppler
ultrasound were also utilized to evaluate blood flow to the
testicles.

[Series 1: us scrotum w/ doppler complete · 0.09mm/px · 14 of 53 slices shown]
[im 1/53]
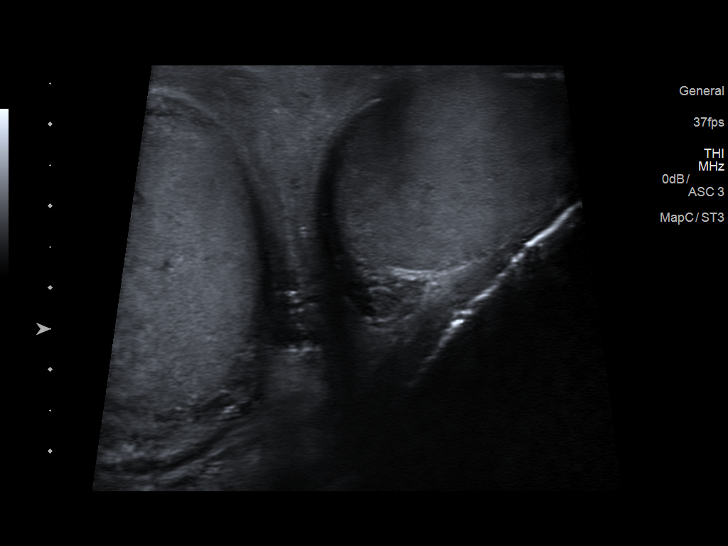
[im 5/53]
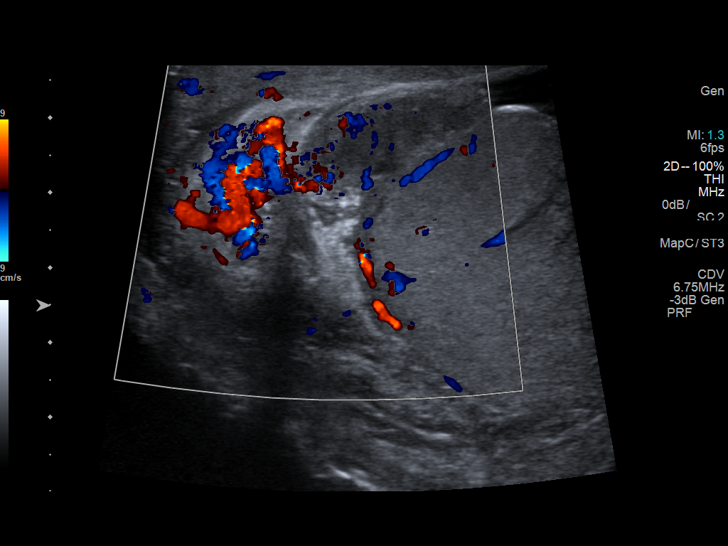
[im 9/53]
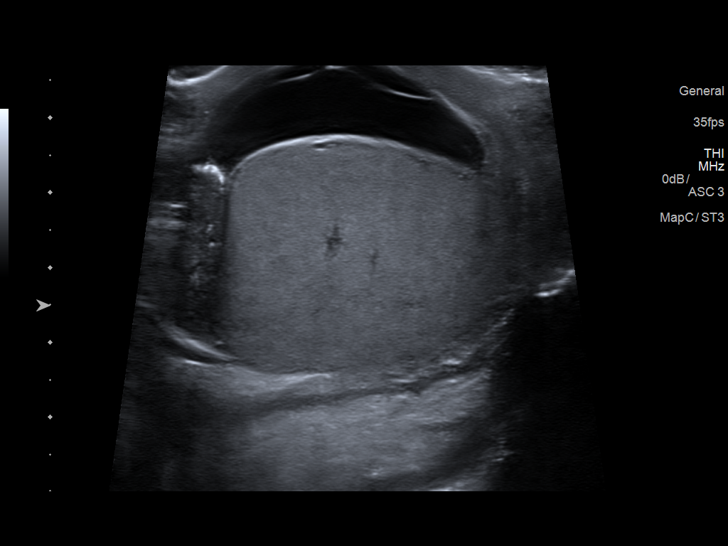
[im 14/53]
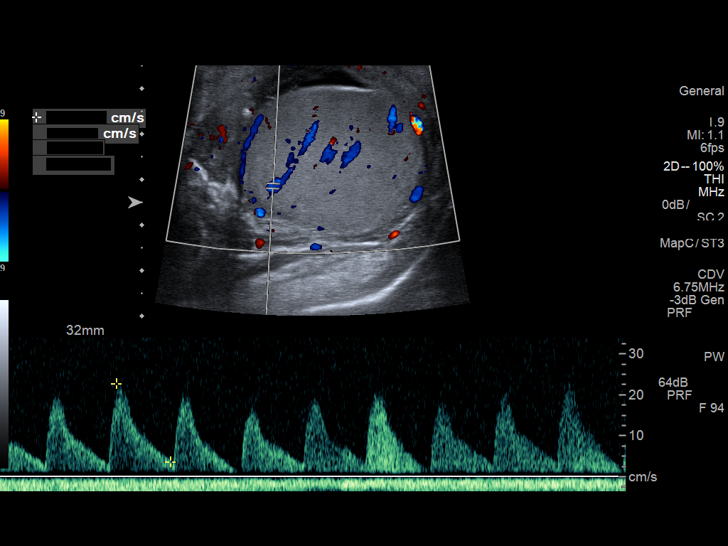
[im 18/53]
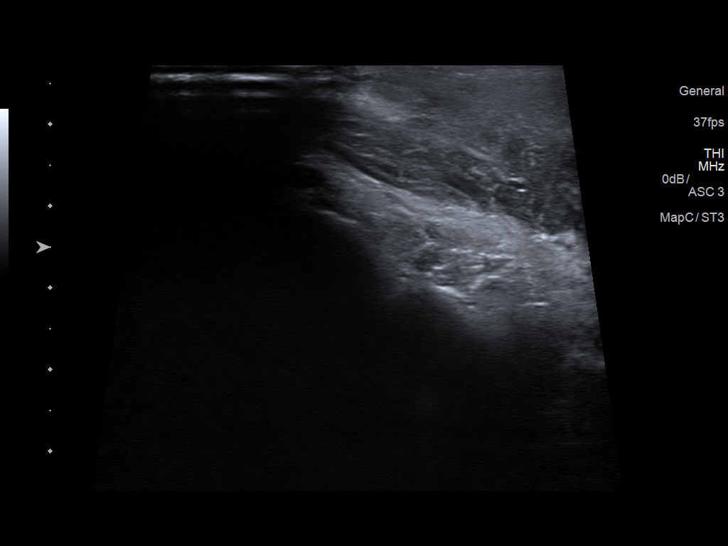
[im 20/53]
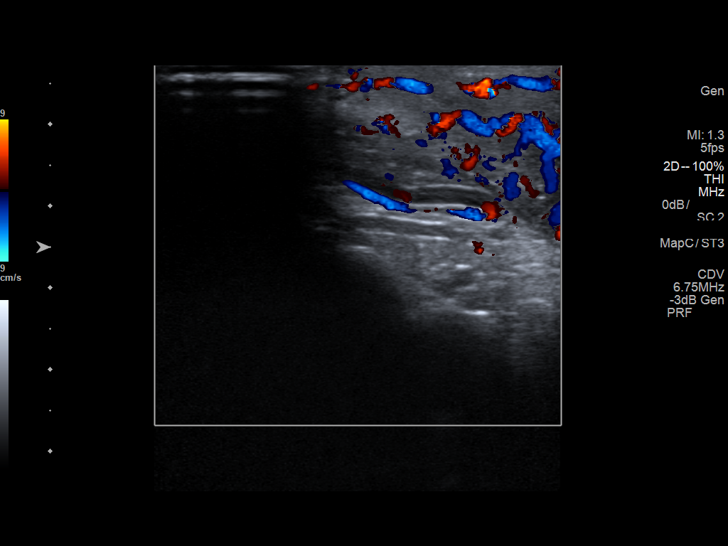
[im 24/53]
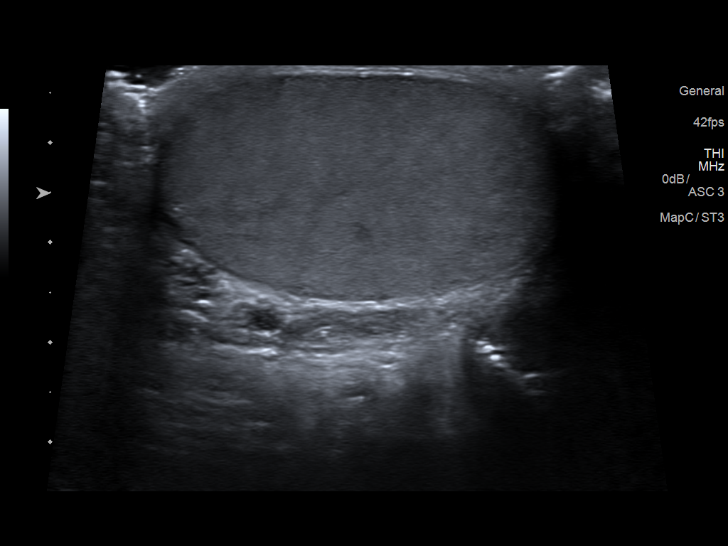
[im 29/53]
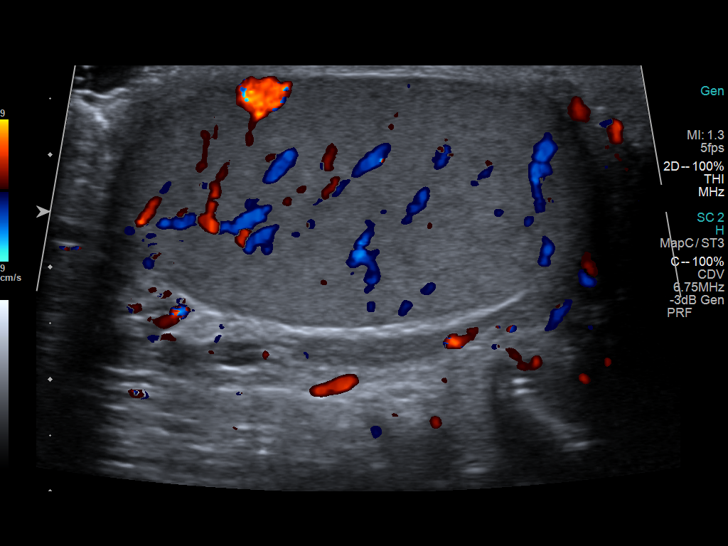
[im 33/53]
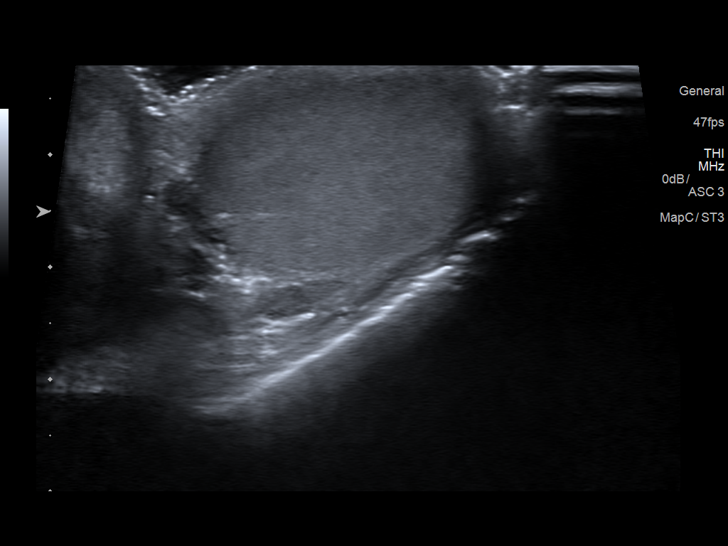
[im 35/53]
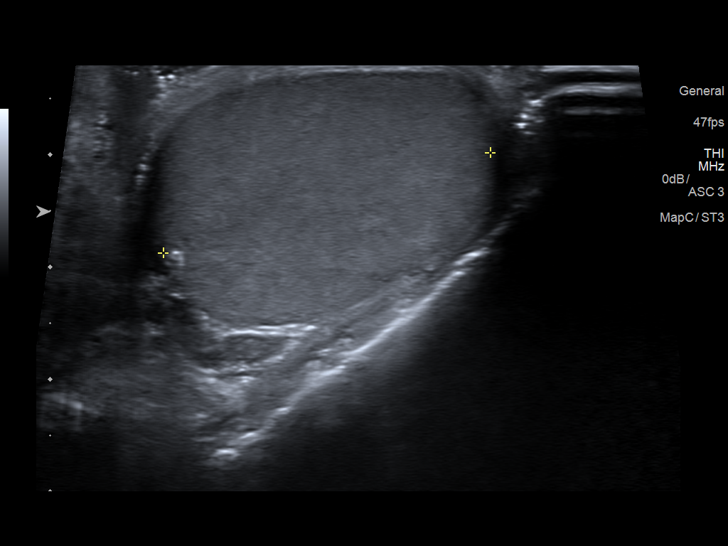
[im 40/53]
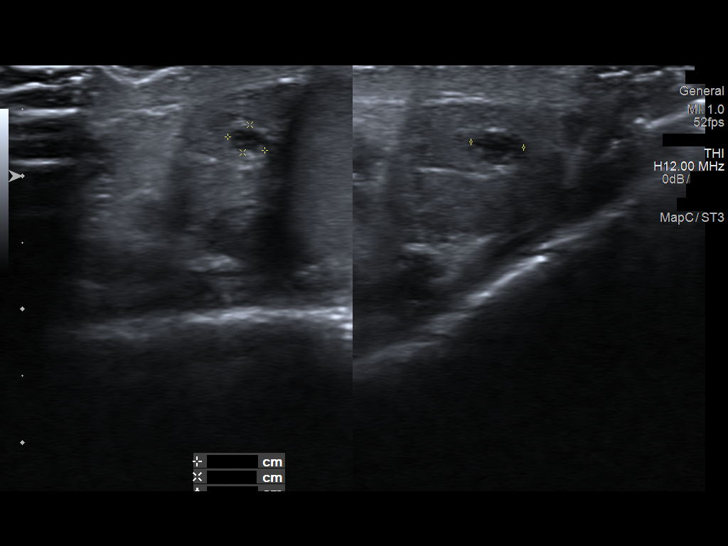
[im 44/53]
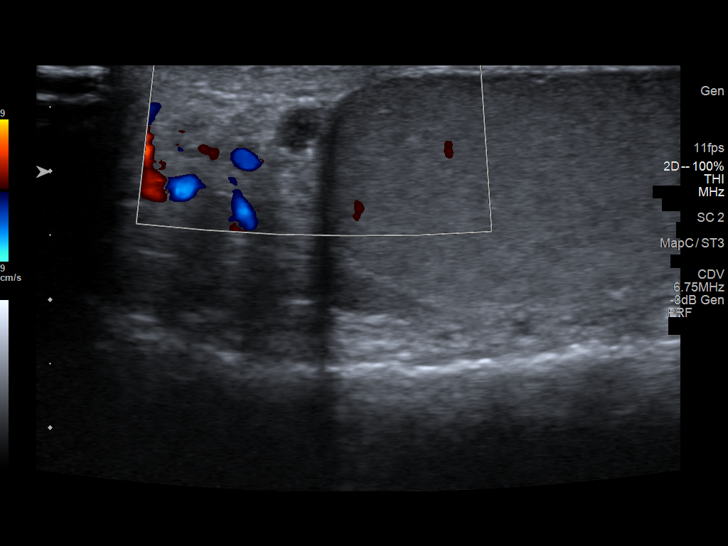
[im 48/53]
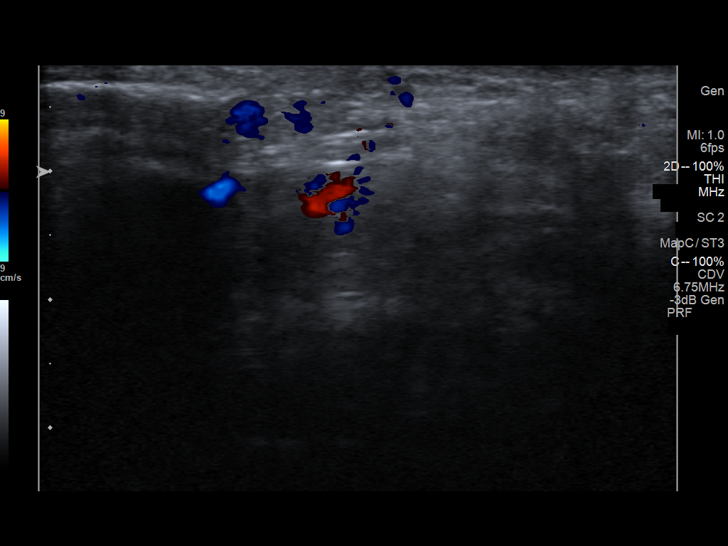
[im 53/53]
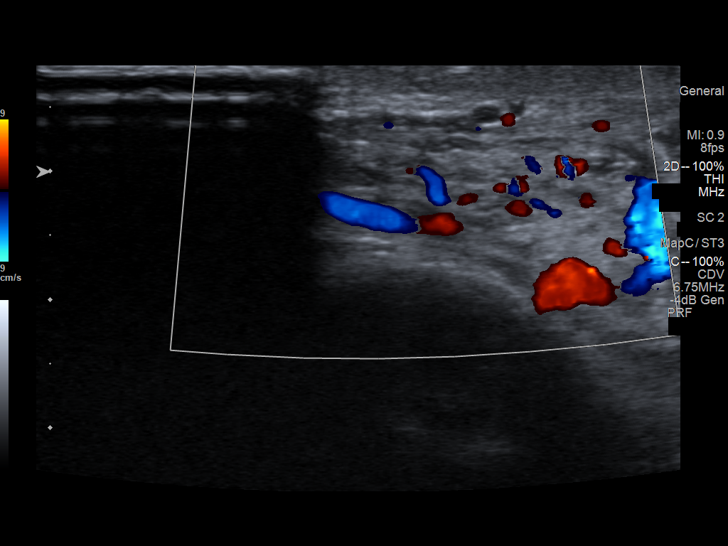

[14 of 25 positions shown; findings below may reference images not displayed]

FINDINGS: Right testicle

Measurements: 4.1 x 3.3 x 3.4 cm. No mass or microlithiasis
visualized.

Left testicle

Measurements: 4.3 x 2.3 x 3.1 cm. No mass or microlithiasis
visualized.

Right epididymis: Enlarged hypervascular right epididymis, similar
to the appearance on the prior exam.

Left epididymis: 4 mm cysts in the otherwise normal appearing left
epididymis

Hydrocele: Small right hydrocele, slightly increased since the prior
study.

Varicocele:  None visualized.

Pulsed Doppler interrogation of both testes demonstrates normal low
resistance arterial and venous waveforms bilaterally.
IMPRESSION: Recurrent right epididymitis.  Increased small right hydrocele.

## 2018-08-03 ENCOUNTER — Telehealth: Payer: Self-pay

## 2018-08-03 NOTE — Telephone Encounter (Signed)
Called to do COVID Screening for appointment tomorrow. No answer. No voicemail. Thanks! 

## 2018-08-04 ENCOUNTER — Ambulatory Visit: Payer: Self-pay | Admitting: Family Medicine

## 2018-10-15 ENCOUNTER — Emergency Department (HOSPITAL_COMMUNITY)
Admission: EM | Admit: 2018-10-15 | Discharge: 2018-10-15 | Disposition: A | Discharge status: Home / Self Care | Payer: Self-pay | Attending: Emergency Medicine | Admitting: Emergency Medicine

## 2018-10-15 ENCOUNTER — Other Ambulatory Visit: Payer: Self-pay

## 2018-10-15 ENCOUNTER — Emergency Department (HOSPITAL_COMMUNITY): Payer: Self-pay

## 2018-10-15 DIAGNOSIS — N390 Urinary tract infection, site not specified: Secondary | ICD-10-CM | POA: Insufficient documentation

## 2018-10-15 DIAGNOSIS — F17210 Nicotine dependence, cigarettes, uncomplicated: Secondary | ICD-10-CM | POA: Insufficient documentation

## 2018-10-15 LAB — URINALYSIS, ROUTINE W REFLEX MICROSCOPIC
Bacteria, UA: NONE SEEN
Bilirubin Urine: NEGATIVE
Glucose, UA: NEGATIVE mg/dL
Ketones, ur: NEGATIVE mg/dL
Nitrite: NEGATIVE
Protein, ur: NEGATIVE mg/dL
Specific Gravity, Urine: 1.02 (ref 1.005–1.030)
pH: 5 (ref 5.0–8.0)

## 2018-10-15 LAB — CBC WITH DIFFERENTIAL/PLATELET
Abs Immature Granulocytes: 0.05 10*3/uL (ref 0.00–0.07)
Basophils Absolute: 0.1 10*3/uL (ref 0.0–0.1)
Basophils Relative: 1 %
Eosinophils Absolute: 0.1 10*3/uL (ref 0.0–0.5)
Eosinophils Relative: 2 %
HCT: 48.1 % (ref 39.0–52.0)
Hemoglobin: 16.2 g/dL (ref 13.0–17.0)
Immature Granulocytes: 1 %
Lymphocytes Relative: 20 %
Lymphs Abs: 1.7 10*3/uL (ref 0.7–4.0)
MCH: 31.5 pg (ref 26.0–34.0)
MCHC: 33.7 g/dL (ref 30.0–36.0)
MCV: 93.4 fL (ref 80.0–100.0)
Monocytes Absolute: 0.7 10*3/uL (ref 0.1–1.0)
Monocytes Relative: 8 %
Neutro Abs: 5.9 10*3/uL (ref 1.7–7.7)
Neutrophils Relative %: 68 %
Platelets: 233 10*3/uL (ref 150–400)
RBC: 5.15 MIL/uL (ref 4.22–5.81)
RDW: 12.3 % (ref 11.5–15.5)
WBC: 8.5 10*3/uL (ref 4.0–10.5)
nRBC: 0 % (ref 0.0–0.2)

## 2018-10-15 LAB — COMPREHENSIVE METABOLIC PANEL
ALT: 13 U/L (ref 0–44)
AST: 18 U/L (ref 15–41)
Albumin: 4.3 g/dL (ref 3.5–5.0)
Alkaline Phosphatase: 61 U/L (ref 38–126)
Anion gap: 7 (ref 5–15)
BUN: 18 mg/dL (ref 6–20)
CO2: 27 mmol/L (ref 22–32)
Calcium: 9.1 mg/dL (ref 8.9–10.3)
Chloride: 105 mmol/L (ref 98–111)
Creatinine, Ser: 1.05 mg/dL (ref 0.61–1.24)
GFR calc Af Amer: >60 mL/min (ref >60)
GFR calc non Af Amer: >60 mL/min (ref >60)
Glucose, Bld: 107 mg/dL — ABNORMAL HIGH (ref 70–99)
Potassium: 4.1 mmol/L (ref 3.5–5.1)
Sodium: 139 mmol/L (ref 135–145)
Total Bilirubin: 0.6 mg/dL (ref 0.3–1.2)
Total Protein: 7 g/dL (ref 6.5–8.1)

## 2018-10-15 MED ORDER — SODIUM CHLORIDE 0.9 % IV BOLUS
1000.0000 mL | Freq: Once | INTRAVENOUS | Status: AC
  Administered 2018-10-15: 1000 mL via INTRAVENOUS

## 2018-10-15 MED ORDER — CEPHALEXIN 500 MG PO CAPS: 500.0000 mg | ORAL_CAPSULE | Freq: Four times a day (QID) | ORAL | 0 refills | Status: AC

## 2018-10-15 NOTE — Discharge Instructions (Addendum)
Return here as needed. Follow up with your doctor for a recheck. °

## 2018-10-15 NOTE — ED Triage Notes (Signed)
Left sided flank pain, worse since yesterday. Also c/o urinary frequency and lower abdominal discomfort

## 2018-10-15 NOTE — ED Provider Notes (Signed)
[facility] EMERGENCY DEPARTMENT Provider Note   CSN: [id number] Arrival date & time: 10/15/18  1146     History   Chief Complaint Chief Complaint  Patient presents with   Flank Pain    HPI Riley Simmons is a 23 y.o. male.     HPI Patient presents to the emergency department with left-sided flank pain that started yesterday.  Patient states he is also having urinary frequency and lower abdominal discomfort as well.  The patient states that he in and out caths himself because of spina bifida.  Patient states that she is make the condition better or worse.  Patient states he did not take any medications prior to arrival.  The patient denies chest pain, shortness of breath, headache,blurred vision, neck pain, fever, cough, weakness, numbness, dizziness, anorexia, edema,  nausea, vomiting, diarrhea, rash, back pain,  hematemesis, bloody stool, near syncope, or syncope. Past Medical History:  Diagnosis Date   Marijuana smoker, continuous    Scoliosis    Spina bifida [facility])    Urethritis     Patient Active Problem List   Diagnosis Date Noted   Spina bifida ([place]) 09/20/2017   Neurogenic bladder 09/20/2017   Leukocytosis    Pain in right testicle    Epididymitis 08/16/2017    Past Surgical History:  Procedure Laterality Date   BACK SURGERY     as a baby due to spina bifida         Home Medications    Prior to Admission medications   Medication Sig Start Date End Date Taking? Authorizing Provider  acetaminophen (TYLENOL) 500 MG tablet Take 500 mg by mouth every 6 (six) hours as needed for mild pain or headache.   Yes [provider]  ibuprofen (ADVIL,MOTRIN) 200 MG tablet Take 800 mg by mouth every 6 (six) hours as needed for moderate pain.   Yes [provider]  Catheters KIT Coloplast 12 french Provide 30 self catheterization kits for PRN catheterization 07/31/16   Pisciotta, Elmyra Ricks, PA-C    Family History No family  history on file.  Social History Social History   Tobacco Use   Smoking status: Current Some Day Smoker    Packs/day: 0.50    Types: Cigarettes   Smokeless tobacco: Never Used  Substance Use Topics   Alcohol use: Not Currently   Drug use: Yes    Types: Marijuana    Comment: daily     Allergies   Levofloxacin, Amoxicillin, and Latex   Review of Systems Review of Systems All other systems negative except as documented in the HPI. All pertinent positives and negatives as reviewed in the HPI.  Physical Exam Updated Vital Signs BP 125/88 (BP Location: Right Arm)    Pulse (!) 59    Temp 98.4 F (36.9 C) (Oral)    Resp 16    Ht '5\' 5"'  (1.651 m)    Wt 68 kg    SpO2 99%    BMI 24.96 kg/m   Physical Exam Vitals signs and nursing note reviewed.  Constitutional:      General: He is not in acute distress.    Appearance: He is well-developed.  HENT:     Head: Normocephalic and atraumatic.  Eyes:     Pupils: Pupils are equal, round, and reactive to light.  Neck:     Musculoskeletal: Normal range of motion and neck supple.  Cardiovascular:     Rate and Rhythm: Normal rate and regular rhythm.  Heart sounds: Normal heart sounds. No murmur. No friction rub. No gallop.   Pulmonary:     Effort: Pulmonary effort is normal. No respiratory distress.     Breath sounds: Normal breath sounds. No wheezing.  Abdominal:     General: Bowel sounds are normal. There is no distension.     Palpations: Abdomen is soft.     Tenderness: There is no abdominal tenderness.  Skin:    General: Skin is warm and dry.     Capillary Refill: Capillary refill takes less than 2 seconds.     Findings: No erythema or rash.  Neurological:     Mental Status: He is alert and oriented to person, place, and time.     Motor: No abnormal muscle tone.     Coordination: Coordination normal.  Psychiatric:        Behavior: Behavior normal.      ED Treatments / Results  Labs (all labs ordered are listed,  but only abnormal results are displayed) Labs Reviewed  COMPREHENSIVE METABOLIC PANEL - Abnormal; Notable for the following components:      Result Value   Glucose, Bld 107 (*)    All other components within normal limits  URINALYSIS, ROUTINE W REFLEX MICROSCOPIC - Abnormal; Notable for the following components:   Hgb urine dipstick SMALL (*)    Leukocytes,Ua SMALL (*)    All other components within normal limits  URINE CULTURE  CBC WITH DIFFERENTIAL/PLATELET    EKG None  Radiology Ct Renal Stone Study  Result Date: 10/15/2018 CLINICAL DATA:  Left flank pain, lower abdominal discomfort and increased urinary frequency. EXAM: CT ABDOMEN AND PELVIS WITHOUT CONTRAST TECHNIQUE: Multidetector CT imaging of the abdomen and pelvis was performed following the standard protocol without IV contrast. COMPARISON:  08/16/2017 FINDINGS: Lower chest: No acute abnormality. Hepatobiliary: No focal liver abnormality is seen. No gallstones, gallbladder wall thickening, or biliary dilatation. Pancreas: Unremarkable. No pancreatic ductal dilatation or surrounding inflammatory changes. Spleen: Normal in size without focal abnormality. Adrenals/Urinary Tract: Stable scarring and atrophy of the left kidney with small focal parenchymal calcification in the lower pole. No evidence of hydronephrosis or urinary tract calculi bilaterally. The bladder is decompressed and unremarkable in appearance. Adrenal glands are unremarkable. Stomach/Bowel: Bowel shows no evidence of obstruction, ileus or inflammation. No free air identified. Vascular/Lymphatic: No significant vascular findings are present. No enlarged abdominal or pelvic lymph nodes. Reproductive: Prostate is unremarkable. Other: No abdominal wall hernia or abnormality. No abdominopelvic ascites. Musculoskeletal: No acute or significant osseous findings. IMPRESSION: Stable scarring and atrophy of the left kidney. No acute findings in the abdomen or pelvis. Electronically  Signed   By: Aletta Edouard M.D.   On: 10/15/2018 13:07    Procedures Procedures (including critical care time)  Medications Ordered in ED Medications  sodium chloride 0.9 % bolus 1,000 mL (0 mLs Intravenous Stopped 10/15/18 1319)     Initial Impression / Assessment and Plan / ED Course  I have reviewed the triage vital signs and the nursing notes.  Pertinent labs & imaging results that were available during my care of the patient were reviewed by me and considered in my medical decision making (see chart for details).        Patient is advised of the results and all questions were answered.  Advised him that he will need to see his urologist for further evaluation.  Also advised him to return to the emergency department for any worsening in his condition.  The patient  requests Keflex as he states this is been effective for him in the past and does not cause significant GI symptoms for him.  Patient has been stable here in the emergency department as well.  Final Clinical Impressions(s) / ED Diagnoses   Final diagnoses:  None    ED Discharge Orders    None       Dalia Heading, Hershal Coria 10/17/18 4801    Lacretia Leigh, MD 10/17/18 1452

## 2018-10-17 LAB — URINE CULTURE: Culture: >=100000 — AB

## 2018-10-18 ENCOUNTER — Telehealth: Payer: Self-pay | Admitting: Emergency Medicine

## 2018-10-18 NOTE — Telephone Encounter (Signed)
Post ED Visit - Positive Culture Follow-up: Successful Patient Follow-Up  Culture assessed and recommendations reviewed by:  []  Elenor Quinones, Pharm.D. [x]  Heide Guile, Pharm.D., BCPS AQ-ID []  Parks Neptune, Pharm.D., BCPS []  Alycia Rossetti, Pharm.D., BCPS []  [place], [place].D., BCPS, AAHIVP []  Legrand Como, Pharm.D., BCPS, AAHIVP []  Salome Arnt, PharmD, BCPS []  Johnnette Gourd, PharmD, BCPS []  Hughes Better, PharmD, BCPS []  Leeroy Cha, PharmD  Positive urine culture  []  Patient discharged without antimicrobial prescription and treatment is now indicated [x]  Organism is resistant to prescribed ED discharge antimicrobial []  Patient with positive blood cultures  Changes discussed with ED provider: Okey Regal PA New antibiotic prescription Stop cephalexin and start bactrim DS 1 tablet po bid x 10 days Called to [facility], [place] 10/18/2018, 10:35 AM

## 2018-10-18 NOTE — Progress Notes (Signed)
ED Antimicrobial Stewardship Positive Culture Follow Up   Riley Simmons is an 23 y.o. male who presented to [facility] on 10/15/2018 with a chief complaint of  Chief Complaint  Patient presents with  . Flank Pain    Recent Results (from the past 720 hour(s))  Urine culture     Status: Abnormal   Collection Time: 10/15/18 12:32 PM   Specimen: Urine, Random  Result Value Ref Range Status   Specimen Description URINE, RANDOM  Final   Special Requests   Final    NONE Performed at [facility], [address] [place], [place] [address]    Culture >=100,000 COLONIES/mL PROTEUS PENNERI (A)  Final   Report Status 10/17/2018 FINAL  Final   Organism ID, Bacteria PROTEUS PENNERI (A)  Final      Susceptibility   Proteus penneri - MIC*    AMPICILLIN >=32 RESISTANT Resistant     CEFAZOLIN >=64 RESISTANT Resistant     CEFTRIAXONE >=64 RESISTANT Resistant     CIPROFLOXACIN <=0.25 SENSITIVE Sensitive     GENTAMICIN <=1 SENSITIVE Sensitive     IMIPENEM 1 SENSITIVE Sensitive     NITROFURANTOIN 256 RESISTANT Resistant     TRIMETH/SULFA <=20 SENSITIVE Sensitive     AMPICILLIN/SULBACTAM >=32 RESISTANT Resistant     PIP/TAZO <=4 SENSITIVE Sensitive     * >=100,000 COLONIES/mL PROTEUS PENNERI    [x]  Treated with cephalexin, organism resistant to prescribed antimicrobial []  Patient discharged originally without antimicrobial agent and treatment is now indicated  New antibiotic prescription: Bactrim DS 1 tablet BID x 10 days  ED Provider: Lenn Sink, PA-C   Candie Mile 10/18/2018, 9:14 AM Clinical Pharmacist Monday - Friday phone -  [phone] Saturday - Sunday phone - [phone]

## 2018-10-26 ENCOUNTER — Encounter (HOSPITAL_COMMUNITY): Payer: Self-pay

## 2018-10-26 ENCOUNTER — Encounter (HOSPITAL_COMMUNITY): Payer: Self-pay | Admitting: *Deleted

## 2018-11-14 ENCOUNTER — Encounter (HOSPITAL_COMMUNITY): Payer: Self-pay | Admitting: Emergency Medicine

## 2018-11-14 ENCOUNTER — Emergency Department (HOSPITAL_COMMUNITY)
Admission: EM | Admit: 2018-11-14 | Discharge: 2018-11-14 | Disposition: A | Discharge status: Home / Self Care | Payer: Self-pay | Attending: Emergency Medicine | Admitting: Emergency Medicine

## 2018-11-14 ENCOUNTER — Other Ambulatory Visit: Payer: Self-pay

## 2018-11-14 ENCOUNTER — Emergency Department (HOSPITAL_COMMUNITY): Payer: Self-pay

## 2018-11-14 ENCOUNTER — Other Ambulatory Visit: Payer: Self-pay | Admitting: *Deleted

## 2018-11-14 DIAGNOSIS — R0981 Nasal congestion: Secondary | ICD-10-CM | POA: Insufficient documentation

## 2018-11-14 DIAGNOSIS — Z91040 Latex allergy status: Secondary | ICD-10-CM | POA: Insufficient documentation

## 2018-11-14 DIAGNOSIS — Z5321 Procedure and treatment not carried out due to patient leaving prior to being seen by health care provider: Secondary | ICD-10-CM | POA: Insufficient documentation

## 2018-11-14 DIAGNOSIS — J4530 Mild persistent asthma, uncomplicated: Secondary | ICD-10-CM | POA: Insufficient documentation

## 2018-11-14 DIAGNOSIS — F17210 Nicotine dependence, cigarettes, uncomplicated: Secondary | ICD-10-CM | POA: Insufficient documentation

## 2018-11-14 DIAGNOSIS — Z20822 Contact with and (suspected) exposure to covid-19: Secondary | ICD-10-CM

## 2018-11-14 MED ORDER — ALBUTEROL SULFATE HFA 108 (90 BASE) MCG/ACT IN AERS
2.0000 | INHALATION_SPRAY | Freq: Four times a day (QID) | RESPIRATORY_TRACT | Status: DC
  Administered 2018-11-14: 09:00:00 2 via RESPIRATORY_TRACT
  Filled 2018-11-14: qty 6.7

## 2018-11-14 MED ORDER — DM-GUAIFENESIN ER 30-600 MG PO TB12: 1.0000 | ORAL_TABLET | Freq: Two times a day (BID) | ORAL | 0 refills | Status: DC

## 2018-11-14 NOTE — ED Provider Notes (Signed)
[place] DEPT Provider Note   CSN: [id number] Arrival date & time: 11/14/18  0620     History   Chief Complaint Chief Complaint  Patient presents with  . Chest Pain    HPI Riley Simmons is a 23 y.o. male.     Patient with complaining of congestion and cough is somewhat productive cough yellow sputum and feeling as if he is wheezing.  Symptoms started this morning when he woke up.  He does have a component of shortness of breath with it.  No real chest pain.  Oxygen saturation is 98% on room air.  Patient denies any fever.  Was feeling fine yesterday.  No myalgias.  No known COVID-19 exposure.     Past Medical History:  Diagnosis Date  . Marijuana smoker, continuous   . Scoliosis   . Spina bifida ([place])   . Urethritis     Patient Active Problem List   Diagnosis Date Noted  . Spina bifida ([place]) 09/20/2017  . Neurogenic bladder 09/20/2017  . Leukocytosis   . Pain in right testicle   . Epididymitis 08/16/2017    Past Surgical History:  Procedure Laterality Date  . BACK SURGERY     as a baby due to spina bifida         Home Medications    Prior to Admission medications   Medication Sig Start Date End Date Taking? Authorizing Provider  Catheters KIT Coloplast 12 french Provide 30 self catheterization kits for PRN catheterization Patient not taking: Reported on 11/14/2018 07/31/16   Pisciotta, Elmyra Ricks, PA-C  dextromethorphan-guaiFENesin (MUCINEX DM) 30-600 MG 12hr tablet Take 1 tablet by mouth 2 (two) times daily. 11/14/18   Fredia Sorrow, MD    Family History History reviewed. No pertinent family history.  Social History Social History   Tobacco Use  . Smoking status: Current Some Day Smoker    Packs/day: 0.50    Types: Cigarettes  . Smokeless tobacco: Never Used  Substance Use Topics  . Alcohol use: Not Currently  . Drug use: Yes    Types: Marijuana    Comment: daily     Allergies   Levofloxacin,  Amoxicillin, and Latex   Review of Systems Review of Systems  Constitutional: Negative for chills and fever.  HENT: Positive for congestion. Negative for rhinorrhea and sore throat.   Eyes: Negative for visual disturbance.  Respiratory: Positive for cough and wheezing. Negative for shortness of breath.   Cardiovascular: Negative for chest pain and leg swelling.  Gastrointestinal: Negative for abdominal pain, diarrhea, nausea and vomiting.  Genitourinary: Negative for dysuria.  Musculoskeletal: Negative for back pain and neck pain.  Skin: Negative for rash.  Neurological: Negative for dizziness, light-headedness and headaches.  Hematological: Does not bruise/bleed easily.  Psychiatric/Behavioral: Negative for confusion.     Physical Exam Updated Vital Signs BP 133/76   Pulse 74   Temp 98.7 F (37.1 C) (Oral)   Resp 18   Ht 1.626 m (_0 )   Wt 68 kg   SpO2 95%   BMI 25.75 kg/m   Physical Exam Vitals signs and nursing note reviewed.  Constitutional:      General: He is not in acute distress.    Appearance: Normal appearance. He is well-developed. He is not toxic-appearing.  HENT:     Head: Normocephalic and atraumatic.  Eyes:     Extraocular Movements: Extraocular movements intact.     Conjunctiva/sclera: Conjunctivae normal.     Pupils: Pupils are equal,  round, and reactive to light.  Neck:     Musculoskeletal: Normal range of motion and neck supple.  Cardiovascular:     Rate and Rhythm: Normal rate and regular rhythm.     Heart sounds: No murmur.  Pulmonary:     Effort: Pulmonary effort is normal. No respiratory distress.     Breath sounds: Wheezing present.  Abdominal:     Palpations: Abdomen is soft.     Tenderness: There is no abdominal tenderness.  Musculoskeletal: Normal range of motion.  Skin:    General: Skin is warm and dry.     Capillary Refill: Capillary refill takes less than 2 seconds.  Neurological:     General: No focal deficit present.      Mental Status: He is alert and oriented to person, place, and time.      ED Treatments / Results  Labs (all labs ordered are listed, but only abnormal results are displayed) Labs Reviewed - No data to display  EKG EKG Interpretation  Date/Time:  Monday November 14 2018 06:43:45 EDT Ventricular Rate:  80 PR Interval:    QRS Duration: 95 QT Interval:  366 QTC Calculation: 423 R Axis:   97 Text Interpretation:  Sinus rhythm Borderline right axis deviation No previous ECGs available Confirmed by Ezequiel Essex [phone]) on 11/14/2018 6:50:36 AM   Radiology Dg Chest 2 View  Result Date: 11/14/2018 CLINICAL DATA:  Cough, congestion, wheezing, smoker, scoliosis and spina bifida EXAM: CHEST - 2 VIEW COMPARISON:  07/22/2016 FINDINGS: Normal heart size, mediastinal contours, and pulmonary vascularity. Lungs clear. No infiltrate, pleural effusion or pneumothorax. Dextroconvex thoracic scoliosis and BILATERAL thoracic deformities again noted. IMPRESSION: No acute abnormalities. Electronically Signed   By: Lavonia Dana M.D.   On: 11/14/2018 08:04    Procedures Procedures (including critical care time)  Medications Ordered in ED Medications  albuterol (VENTOLIN HFA) 108 (90 Base) MCG/ACT inhaler 2 puff (2 puffs Inhalation Given 11/14/18 0856)     Initial Impression / Assessment and Plan / ED Course  I have reviewed the triage vital signs and the nursing notes.  Pertinent labs & imaging results that were available during my care of the patient were reviewed by me and considered in my medical decision making (see chart for details).        Chest x-ray negative no signs of pneumonia.  Patient with some improvement after albuterol inhaler.  Will treat symptomatically with albuterol inhaler for the next 7 days.  As well as Mucinex DM.  Work note provided.  Patient will return for any new or worse symptoms.  Patient nontoxic no acute distress.  Oxygen saturations are good mid to upper 90s.   Clinically patient's symptoms do not seem to be consistent with COVID-19 infection.  Final Clinical Impressions(s) / ED Diagnoses   Final diagnoses:  Mild persistent reactive airway disease without complication    ED Discharge Orders         Ordered    dextromethorphan-guaiFENesin [facility] DM) 30-600 MG 12hr tablet  2 times daily     11/14/18 1025           Fredia Sorrow, MD 11/14/18 1040

## 2018-11-14 NOTE — Discharge Instructions (Signed)
Use the albuterol inhaler 2 puffs every 6 hours for the next 7 days.  Take the Mucinex DM as directed.  This will help suppress the cough and loosen the phlegm.  Work note provided.  Return for any new or worse symptoms.

## 2018-11-14 NOTE — ED Triage Notes (Signed)
Pt c/o nasal congestion x 1 day.

## 2018-11-14 NOTE — ED Notes (Signed)
ED Provider at bedside. 

## 2018-11-14 NOTE — ED Triage Notes (Signed)
Patient is complaining of congestion, coughing, and wheezing. No wheezing heard at this moment. Patient states he woke up having sob.  Patient O2 Sat is 98% on room air.

## 2018-11-15 LAB — NOVEL CORONAVIRUS, NAA: SARS-CoV-2, NAA: NOT DETECTED

## 2018-11-18 ENCOUNTER — Other Ambulatory Visit: Payer: Self-pay

## 2018-11-18 DIAGNOSIS — Z20822 Contact with and (suspected) exposure to covid-19: Secondary | ICD-10-CM

## 2018-11-20 LAB — NOVEL CORONAVIRUS, NAA: SARS-CoV-2, NAA: NOT DETECTED

## 2019-01-11 ENCOUNTER — Other Ambulatory Visit: Payer: Self-pay

## 2019-01-11 ENCOUNTER — Telehealth: Payer: Self-pay | Admitting: Internal Medicine

## 2019-01-11 MED ORDER — CATHETERS KIT: PACK | 3 refills | Status: DC

## 2019-01-11 NOTE — Telephone Encounter (Signed)
This has been refilled and faxed to [facility] at fax [phone].

## 2019-02-07 ENCOUNTER — Ambulatory Visit: Payer: Self-pay | Admitting: Family Medicine

## 2019-03-20 ENCOUNTER — Emergency Department (HOSPITAL_COMMUNITY)
Admission: EM | Admit: 2019-03-20 | Discharge: 2019-03-20 | Disposition: A | Discharge status: Home / Self Care | Payer: Self-pay | Attending: Emergency Medicine | Admitting: Emergency Medicine

## 2019-03-20 ENCOUNTER — Other Ambulatory Visit: Payer: Self-pay

## 2019-03-20 ENCOUNTER — Encounter (HOSPITAL_COMMUNITY): Payer: Self-pay

## 2019-03-20 DIAGNOSIS — R350 Frequency of micturition: Secondary | ICD-10-CM | POA: Insufficient documentation

## 2019-03-20 DIAGNOSIS — F17210 Nicotine dependence, cigarettes, uncomplicated: Secondary | ICD-10-CM | POA: Insufficient documentation

## 2019-03-20 DIAGNOSIS — R109 Unspecified abdominal pain: Secondary | ICD-10-CM | POA: Insufficient documentation

## 2019-03-20 DIAGNOSIS — Z91040 Latex allergy status: Secondary | ICD-10-CM | POA: Insufficient documentation

## 2019-03-20 DIAGNOSIS — F1210 Cannabis abuse, uncomplicated: Secondary | ICD-10-CM | POA: Insufficient documentation

## 2019-03-20 LAB — URINALYSIS, ROUTINE W REFLEX MICROSCOPIC
Bilirubin Urine: NEGATIVE
Glucose, UA: NEGATIVE mg/dL
Hgb urine dipstick: NEGATIVE
Ketones, ur: NEGATIVE mg/dL
Leukocytes,Ua: NEGATIVE
Nitrite: NEGATIVE
Protein, ur: NEGATIVE mg/dL
Specific Gravity, Urine: 1.016 (ref 1.005–1.030)
pH: 5 (ref 5.0–8.0)

## 2019-03-20 MED ORDER — CIPROFLOXACIN HCL 500 MG PO TABS
500.0000 mg | ORAL_TABLET | Freq: Once | ORAL | Status: AC
  Administered 2019-03-20: 23:00:00 500 mg via ORAL
  Filled 2019-03-20: qty 1

## 2019-03-20 MED ORDER — CIPROFLOXACIN HCL 500 MG PO TABS: 500.0000 mg | ORAL_TABLET | Freq: Two times a day (BID) | ORAL | 0 refills | Status: DC

## 2019-03-20 NOTE — ED Provider Notes (Signed)
[place] DEPT Provider Note   CSN: [id number] Arrival date & time: 03/20/19  1805     History Chief Complaint  Patient presents with  . Flank Pain  . Urinary Frequency    Riley Simmons is a 24 y.o. male.  The history is provided by the patient and medical records.  Flank Pain  Urinary Frequency   24 y.o. M with hx of scoliosis, spina bifida, urethritis, presenting to the ED for urinary frequency.  Patient reports this is been ongoing for almost 2 weeks now.  Due to his spina bifida he has neurogenic bladder and has to self cath.  He does not currently have health insurance and so has to wash him reuse his catheters.  States he feels like he has another UTI which she gets frequently.  He has been taking Keflex for almost 2 weeks but has not had any improvement.  States he feels the urge to urinate almost every 5 minutes or so.  He denies any fever, chills, nausea, vomiting, or diarrhea.  Past Medical History:  Diagnosis Date  . Marijuana smoker, continuous   . Scoliosis   . Spina bifida ([place])   . Urethritis     Patient Active Problem List   Diagnosis Date Noted  . Spina bifida ([place]) 09/20/2017  . Neurogenic bladder 09/20/2017  . Leukocytosis   . Pain in right testicle   . Epididymitis 08/16/2017    Past Surgical History:  Procedure Laterality Date  . BACK SURGERY     as a baby due to spina bifida   . BLADDER SURGERY         Family History  Problem Relation Age of Onset  . Deafness Mother     Social History   Tobacco Use  . Smoking status: Current Some Day Smoker    Packs/day: 0.50    Types: Cigarettes  . Smokeless tobacco: Never Used  Substance Use Topics  . Alcohol use: Not Currently  . Drug use: Yes    Types: Marijuana    Comment: daily    Home Medications Prior to Admission medications   Medication Sig Start Date End Date Taking? Authorizing Provider  cephALEXin (KEFLEX) 500 MG capsule Take 500 mg by mouth  2 (two) times daily.   Yes [provider]  Catheters KIT Coloplast 12 french Provide 30 self catheterization kits for PRN catheterization 01/11/19   Azzie Glatter, FNP  dextromethorphan-guaiFENesin [facility] DM) 30-600 MG 12hr tablet Take 1 tablet by mouth 2 (two) times daily. Patient not taking: Reported on 03/20/2019 11/14/18   Fredia Sorrow, MD    Allergies    Levofloxacin, Amoxicillin, and Latex  Review of Systems   Review of Systems  Genitourinary: Positive for flank pain and frequency.  All other systems reviewed and are negative.   Physical Exam Updated Vital Signs BP (!) 129/92 (BP Location: Right Arm)   Pulse 60   Temp 98.5 F (36.9 C) (Oral)   Resp 18   Ht '5\' 5"'  (1.651 m)   Wt 68 kg   SpO2 97%   BMI 24.96 kg/m   Physical Exam Vitals and nursing note reviewed.  Constitutional:      Appearance: He is well-developed. He is not ill-appearing or toxic-appearing.     Comments: Talking on the phone, NAD  HENT:     Head: Normocephalic and atraumatic.  Eyes:     Conjunctiva/sclera: Conjunctivae normal.     Pupils: Pupils are equal, round, and reactive  to light.  Cardiovascular:     Rate and Rhythm: Normal rate and regular rhythm.     Heart sounds: Normal heart sounds.  Pulmonary:     Effort: Pulmonary effort is normal. No respiratory distress.     Breath sounds: Normal breath sounds. No rhonchi.  Abdominal:     General: Bowel sounds are normal.     Palpations: Abdomen is soft.     Tenderness: There is no abdominal tenderness. There is no guarding or rebound.     Comments: No CVA tenderness  Musculoskeletal:        General: Normal range of motion.     Cervical back: Normal range of motion.  Skin:    General: Skin is warm and dry.  Neurological:     Mental Status: He is alert and oriented to person, place, and time.     ED Results / Procedures / Treatments   Labs (all labs ordered are listed, but only abnormal results are displayed) Labs  Reviewed  URINALYSIS, ROUTINE W REFLEX MICROSCOPIC    EKG None  Radiology No results found.  Procedures Procedures (including critical care time)  Medications Ordered in ED Medications - No data to display  ED Course  I have reviewed the triage vital signs and the nursing notes.  Pertinent labs & imaging results that were available during my care of the patient were reviewed by me and considered in my medical decision making (see chart for details).    MDM Rules/Calculators/A&P  24 year old male here with urinary frequency and reported "kidney pain".  History of neurogenic bladder requiring self caths due to his spina bifida.  Has been reusing his home catheters due to lack of insurance.  He is afebrile and nontoxic in appearance.  His abdomen is soft and benign, no CVA tenderness.  UA here without signs of infection.  He does report that urine sometimes does not show infection, but will remain present on culture.  Urine culture was added on today.  Last urine culture grew out proteus penneri which was sensitive to cipro which patient states has worked well for him in the past.  Given dose here and will send script to pharmacy for same.  He was encouraged to follow-up with PCP.  Return here for any new/acute changes.  Final Clinical Impression(s) / ED Diagnoses Final diagnoses:  Urinary frequency    Rx / DC Orders ED Discharge Orders         Ordered    ciprofloxacin (CIPRO) 500 MG tablet  Every 12 hours     03/20/19 2224           Larene Pickett, PA-C 03/20/19 2313    Virgel Manifold, MD 03/22/19 1511

## 2019-03-20 NOTE — ED Triage Notes (Signed)
Patient c/o left flank pain x 1 1/2 weeks. Patient states he has been on antibiotics. Patient states he gets frequent UTI's

## 2019-03-20 NOTE — Discharge Instructions (Signed)
Take the prescribed medication as directed-- has been sent to your pharmacy. Follow-up with your primary care doctor. Return to the ED for new or worsening symptoms.

## 2019-03-21 ENCOUNTER — Ambulatory Visit: Payer: MEDICAID | Attending: Internal Medicine

## 2019-03-21 DIAGNOSIS — Z20822 Contact with and (suspected) exposure to covid-19: Secondary | ICD-10-CM

## 2019-03-22 ENCOUNTER — Other Ambulatory Visit: Payer: Self-pay

## 2019-03-22 LAB — URINE CULTURE: Culture: 60000 — AB

## 2019-03-22 LAB — NOVEL CORONAVIRUS, NAA: SARS-CoV-2, NAA: NOT DETECTED

## 2019-03-23 ENCOUNTER — Telehealth: Payer: Self-pay | Admitting: *Deleted

## 2019-03-23 NOTE — Telephone Encounter (Signed)
Post ED Visit - Positive Culture Follow-up  Culture report reviewed by antimicrobial stewardship pharmacist: [facility] []  , Pharm.D. []  Enzo Bi, Pharm.D., BCPS AQ-ID []  , Pharm.D., BCPS []  Celedonio Miyamoto, .D., BCPS []  [place], .D., BCPS, AAHIVP []  Georgina Pillion, Pharm.D., BCPS, AAHIVP []  [address], PharmD, BCPS []  , PharmD, BCPS []  [place], PharmD, BCPS []  [address], PharmD []  , PharmD, BCPS []  Estella Husk, PharmD  Pharmacy Team []  Lysle Pearl, PharmD []  , PharmD []  Phillips Climes, PharmD []  , Rph []  Agapito Games) , PharmD []  Verlan Friends, PharmD []  , PharmD []  Mervyn Gay, PharmD []  , PharmD []  Vinnie Level, PharmD [x]  [facility], PharmD []  , PharmD []  Len Childs, PharmD   Positive urine culture Treated with Ciprofloxacin, organism sensitive to the same and no further patient follow-up is required at this time.  [facility] 03/23/2019, 10:04 AM

## 2019-03-31 ENCOUNTER — Other Ambulatory Visit: Payer: Self-pay

## 2019-03-31 ENCOUNTER — Emergency Department (HOSPITAL_COMMUNITY)
Admission: EM | Admit: 2019-03-31 | Discharge: 2019-04-01 | Disposition: A | Discharge status: Home / Self Care | Payer: Self-pay | Attending: Emergency Medicine | Admitting: Emergency Medicine

## 2019-03-31 DIAGNOSIS — N3001 Acute cystitis with hematuria: Secondary | ICD-10-CM | POA: Insufficient documentation

## 2019-03-31 DIAGNOSIS — Z91040 Latex allergy status: Secondary | ICD-10-CM | POA: Insufficient documentation

## 2019-03-31 DIAGNOSIS — F17210 Nicotine dependence, cigarettes, uncomplicated: Secondary | ICD-10-CM | POA: Insufficient documentation

## 2019-03-31 NOTE — ED Triage Notes (Signed)
Arrived POV patient has Spinal Bifida and self caths. patient reports he took antibiotics for bladder infection, which he has frequently because he is unable to afford the things he needs to perform sterile craterization every time. Also reports urgency and bilateral flank pain

## 2019-04-01 ENCOUNTER — Encounter (HOSPITAL_COMMUNITY): Payer: Self-pay | Admitting: Emergency Medicine

## 2019-04-01 ENCOUNTER — Emergency Department (HOSPITAL_COMMUNITY): Payer: Self-pay

## 2019-04-01 LAB — URINALYSIS, ROUTINE W REFLEX MICROSCOPIC
Bacteria, UA: NONE SEEN
Bilirubin Urine: NEGATIVE
Glucose, UA: NEGATIVE mg/dL
Ketones, ur: NEGATIVE mg/dL
Leukocytes,Ua: NEGATIVE
Nitrite: POSITIVE — AB
Protein, ur: 30 mg/dL — AB
RBC / HPF: >50 RBC/hpf — ABNORMAL HIGH (ref 0–5)
Specific Gravity, Urine: 1.02 (ref 1.005–1.030)
pH: 5 (ref 5.0–8.0)

## 2019-04-01 MED ORDER — SULFAMETHOXAZOLE-TRIMETHOPRIM 800-160 MG PO TABS
1.0000 | ORAL_TABLET | Freq: Once | ORAL | Status: AC
  Administered 2019-04-01: 1 via ORAL
  Filled 2019-04-01: qty 1

## 2019-04-01 MED ORDER — SULFAMETHOXAZOLE-TRIMETHOPRIM 800-160 MG PO TABS: 1.0000 | ORAL_TABLET | Freq: Two times a day (BID) | ORAL | 0 refills | Status: AC

## 2019-04-01 NOTE — ED Provider Notes (Signed)
[facility] DEPT Provider Note   CSN: [id number] Arrival date & time: 03/31/19  2243     History Chief Complaint  Patient presents with  . Cystitis    Riley Simmons is a 24 y.o. male.  The history is provided by the patient.  Flank Pain This is a recurrent problem. The current episode started more than 2 days ago. The problem occurs constantly. The problem has not changed since onset.Pertinent negatives include no chest pain, no abdominal pain, no headaches and no shortness of breath. Nothing aggravates the symptoms. Nothing relieves the symptoms. He has tried nothing for the symptoms. The treatment provided no relief.  B flank pain and urgency.  No f/c/r.       Past Medical History:  Diagnosis Date  . Marijuana smoker, continuous   . Scoliosis   . Spina bifida ([place])   . Urethritis     Patient Active Problem List   Diagnosis Date Noted  . Spina bifida ([place]) 09/20/2017  . Neurogenic bladder 09/20/2017  . Leukocytosis   . Pain in right testicle   . Epididymitis 08/16/2017    Past Surgical History:  Procedure Laterality Date  . BACK SURGERY     as a baby due to spina bifida   . BLADDER SURGERY         Family History  Problem Relation Age of Onset  . Deafness Mother     Social History   Tobacco Use  . Smoking status: Current Some Day Smoker    Packs/day: 0.50    Types: Cigarettes  . Smokeless tobacco: Never Used  Substance Use Topics  . Alcohol use: Not Currently  . Drug use: Yes    Types: Marijuana    Comment: daily    Home Medications Prior to Admission medications   Medication Sig Start Date End Date Taking? Authorizing Provider  ciprofloxacin (CIPRO) 500 MG tablet Take 1 tablet (500 mg total) by mouth every 12 (twelve) hours. 03/20/19  Yes Larene Pickett, PA-C  Catheters KIT Coloplast 12 french Provide 30 self catheterization kits for PRN catheterization 01/11/19   Azzie Glatter, FNP    dextromethorphan-guaiFENesin [facility] DM) 30-600 MG 12hr tablet Take 1 tablet by mouth 2 (two) times daily. Patient not taking: Reported on 03/20/2019 11/14/18   Fredia Sorrow, MD    Allergies    Levofloxacin, Amoxicillin, and Latex  Review of Systems   Review of Systems  Constitutional: Negative for fever.  HENT: Negative for congestion.   Eyes: Negative for visual disturbance.  Respiratory: Negative for shortness of breath.   Cardiovascular: Negative for chest pain.  Gastrointestinal: Negative for abdominal pain.  Genitourinary: Positive for flank pain. Negative for dysuria, penile swelling and scrotal swelling.  Musculoskeletal: Negative for arthralgias.  Neurological: Negative for headaches.  Psychiatric/Behavioral: Negative for agitation.  All other systems reviewed and are negative.   Physical Exam Updated Vital Signs BP (!) 128/96 (BP Location: Right Arm)   Pulse 95   Temp 98 F (36.7 C) (Oral)   Resp 16   Ht '5\' 6"'  (1.676 m)   Wt 68 kg   SpO2 98%   BMI 24.21 kg/m   Physical Exam Vitals and nursing note reviewed.  Constitutional:      Appearance: Normal appearance.  HENT:     Head: Normocephalic and atraumatic.     Nose: Nose normal.  Eyes:     Conjunctiva/sclera: Conjunctivae normal.     Pupils: Pupils are equal, round, and reactive  to light.  Cardiovascular:     Rate and Rhythm: Normal rate and regular rhythm.     Pulses: Normal pulses.     Heart sounds: Normal heart sounds.  Pulmonary:     Effort: Pulmonary effort is normal.     Breath sounds: Normal breath sounds.  Abdominal:     General: Abdomen is flat. Bowel sounds are normal.     Tenderness: There is no abdominal tenderness. There is no guarding or rebound.  Musculoskeletal:        General: Normal range of motion.     Cervical back: Normal range of motion and neck supple.  Skin:    General: Skin is warm and dry.     Capillary Refill: Capillary refill takes less than 2 seconds.   Neurological:     General: No focal deficit present.     Mental Status: He is alert and oriented to person, place, and time.  Psychiatric:        Mood and Affect: Mood normal.        Behavior: Behavior normal.     ED Results / Procedures / Treatments   Labs (all labs ordered are listed, but only abnormal results are displayed) Labs Reviewed  URINALYSIS, ROUTINE W REFLEX MICROSCOPIC - Abnormal; Notable for the following components:      Result Value   Color, Urine AMBER (*)    Hgb urine dipstick LARGE (*)    Protein, ur 30 (*)    Nitrite POSITIVE (*)    RBC / HPF >50 (*)    All other components within normal limits    EKG None  Radiology CT Renal Stone Study  Result Date: 04/01/2019 CLINICAL DATA:  Pain. Patient reports urinary urgency and bilateral flank pain. Self catheterizes due to spina bifida. EXAM: CT ABDOMEN AND PELVIS WITHOUT CONTRAST TECHNIQUE: Multidetector CT imaging of the abdomen and pelvis was performed following the standard protocol without IV contrast. COMPARISON:  CT 10/15/2018 FINDINGS: Lower chest: No acute airspace disease. Stable left Bochdalek hernia containing fat. Hepatobiliary: No focal liver abnormality is seen. No gallstones, gallbladder wall thickening, or biliary dilatation. Pancreas: No ductal dilatation or inflammation. Spleen: Normal in size without focal abnormality. Adrenals/Urinary Tract: Normal adrenal glands. Chronic left renal scarring and atrophy. Nonobstructing stone versus parenchymal calcification in the lower left kidney measuring 6 mm, unchanged. Lobulated upper left renal contours are unchanged. No right renal stones. No hydronephrosis of either kidney. No significant perinephric edema. Both ureters are decompressed. No ureteral calculi. Urinary bladder near completely empty. Suggestion of mild diffuse wall thickening. No bladder stone. Stomach/Bowel: Stomach is within normal limits. No evidence of bowel wall thickening, distention, or  inflammatory changes. The appendix is not well visualized no evidence of appendicitis. Vascular/Lymphatic: Abdominal aorta is normal in caliber. No adenopathy. Reproductive: Prostate is unremarkable. Other: No free air, free fluid, or intra-abdominal fluid collection. Tiny fat containing umbilical hernia Musculoskeletal: Mild scoliotic curvature of the spine. Vertebral anatomy is in the lower thoracic spine partially included. There are no acute or suspicious osseous abnormalities. Postsurgical change at the lumbosacral junction. IMPRESSION: 1. Chronic left renal scarring and atrophy. Nonobstructing stone versus parenchymal calcification in the lower left kidney. No hydronephrosis or obstructive uropathy. 2. Nondistended bladder with suggestion of bladder wall thickening, may be due to nondistention or cystitis. Electronically Signed   By: Keith Rake M.D.   On: 04/01/2019 00:42    Procedures Procedures (including critical care time)  Medications Ordered in ED Medications  sulfamethoxazole-trimethoprim (  BACTRIM DS) 800-160 MG per tablet 1 tablet (has no administration in time range)    ED Course  I have reviewed the triage vital signs and the nursing notes.  Pertinent labs & imaging results that were available during my care of the patient were reviewed by me and considered in my medical decision making (see chart for details).    Will start bactrim.  Blood likely from trauma of self catheterization.  No stones.  No signs of ascending infection or sepsis.  Will have patient follow up with urology.   JAE SKEET was evaluated in Emergency Department on 04/01/2019 for the symptoms described in the history of present illness. He was evaluated in the context of the global COVID-19 pandemic, which necessitated consideration that the patient might be at risk for infection with the SARS-CoV-2 virus that causes COVID-19. Institutional protocols and algorithms that pertain to the evaluation of  patients at risk for COVID-19 are in a state of rapid change based on information released by regulatory bodies including the CDC and federal and state organizations. These policies and algorithms were followed during the patient's care in the ED.   Final Clinical Impression(s) / ED Diagnoses Return for weakness, numbness, changes in vision or speech, fevers >100.4 unrelieved by medication, shortness of breath, intractable vomiting, or diarrhea, abdominal pain, Inability to tolerate liquids or food, cough, altered mental status or any concerns. No signs of systemic illness or infection. The patient is nontoxic-appearing on exam and vital signs are within normal limits.   I have reviewed the triage vital signs and the nursing notes. Pertinent labs &imaging results that were available during my care of the patient were reviewed by me and considered in my medical decision making (see chart for details).  After history, exam, and medical workup I feel the patient has been appropriately medically screened and is safe for discharge home. Pertinent diagnoses were discussed with the patient. Patient was given return precautions      Malak Orantes, MD 04/01/19 0100

## 2019-05-04 ENCOUNTER — Encounter (HOSPITAL_COMMUNITY): Payer: Self-pay

## 2019-05-04 ENCOUNTER — Emergency Department (HOSPITAL_COMMUNITY)
Admission: EM | Admit: 2019-05-04 | Discharge: 2019-05-05 | Disposition: A | Discharge status: Home / Self Care | Payer: Self-pay | Attending: Emergency Medicine | Admitting: Emergency Medicine

## 2019-05-04 ENCOUNTER — Other Ambulatory Visit: Payer: Self-pay

## 2019-05-04 DIAGNOSIS — Z91040 Latex allergy status: Secondary | ICD-10-CM | POA: Insufficient documentation

## 2019-05-04 DIAGNOSIS — Q059 Spina bifida, unspecified: Secondary | ICD-10-CM | POA: Insufficient documentation

## 2019-05-04 DIAGNOSIS — F17210 Nicotine dependence, cigarettes, uncomplicated: Secondary | ICD-10-CM | POA: Insufficient documentation

## 2019-05-04 DIAGNOSIS — N3000 Acute cystitis without hematuria: Secondary | ICD-10-CM

## 2019-05-04 LAB — URINALYSIS, ROUTINE W REFLEX MICROSCOPIC
Bilirubin Urine: NEGATIVE
Glucose, UA: NEGATIVE mg/dL
Hgb urine dipstick: NEGATIVE
Ketones, ur: NEGATIVE mg/dL
Leukocytes,Ua: NEGATIVE
Nitrite: NEGATIVE
Protein, ur: NEGATIVE mg/dL
Specific Gravity, Urine: 1.019 (ref 1.005–1.030)
pH: 6 (ref 5.0–8.0)

## 2019-05-04 MED ORDER — SULFAMETHOXAZOLE-TRIMETHOPRIM 800-160 MG PO TABS
1.0000 | ORAL_TABLET | Freq: Once | ORAL | Status: AC
  Administered 2019-05-05: 1 via ORAL
  Filled 2019-05-04: qty 1

## 2019-05-04 MED ORDER — CATHETERS KIT: PACK | 3 refills | Status: DC

## 2019-05-04 MED ORDER — SULFAMETHOXAZOLE-TRIMETHOPRIM 800-160 MG PO TABS: 1.0000 | ORAL_TABLET | Freq: Two times a day (BID) | ORAL | 0 refills | Status: AC

## 2019-05-04 NOTE — ED Triage Notes (Signed)
Arrived POV from home. Patient reports he has another bladder infection. Patient self caths and says this is the way he gets every time he has a bladder infection.

## 2019-05-04 NOTE — ED Provider Notes (Signed)
[facility] DEPT Provider Note   CSN: [id number] Arrival date & time: 05/04/19  2025     History Chief Complaint  Patient presents with  . Cystitis    Riley Simmons is a 24 y.o. male.  24 year old male with a history of spina bifida and neurogenic bladder presents to the emergency department for concern surrounding UTI.  He states that he has needed to self cath since birth.  He had a temporary lapse in his insurance coverage and has needed to reuse his Foley catheters.  Began experiencing urinary frequency and urgency with decreased urinary stream yesterday.  He has had the symptoms in the past associated with a urinary tract infection.  Highest recorded temperature was 99.9 F 2 days ago.  No back pain, nausea, vomiting.  No medications taken prior to arrival.  Last treated for UTI 1 month ago.  The history is provided by the patient. No language interpreter was used.       Past Medical History:  Diagnosis Date  . Marijuana smoker, continuous   . Scoliosis   . Spina bifida ([place])   . Urethritis     Patient Active Problem List   Diagnosis Date Noted  . Spina bifida ([place]) 09/20/2017  . Neurogenic bladder 09/20/2017  . Leukocytosis   . Pain in right testicle   . Epididymitis 08/16/2017    Past Surgical History:  Procedure Laterality Date  . BACK SURGERY     as a baby due to spina bifida   . BLADDER SURGERY         Family History  Problem Relation Age of Onset  . Deafness Mother     Social History   Tobacco Use  . Smoking status: Current Some Day Smoker    Packs/day: 0.50    Types: Cigarettes  . Smokeless tobacco: Never Used  Substance Use Topics  . Alcohol use: Not Currently  . Drug use: Yes    Types: Marijuana    Comment: daily    Home Medications Prior to Admission medications   Medication Sig Start Date End Date Taking? Authorizing Provider  Catheters KIT Coloplast 12 french Provide 30 self catheterization kits  for PRN catheterization 05/04/19   Antonietta Breach, PA-C  dextromethorphan-guaiFENesin [facility] DM) 30-600 MG 12hr tablet Take 1 tablet by mouth 2 (two) times daily. Patient not taking: Reported on 03/20/2019 11/14/18   Fredia Sorrow, MD  sulfamethoxazole-trimethoprim (BACTRIM DS) 800-160 MG tablet Take 1 tablet by mouth 2 (two) times daily for 7 days. 05/04/19 05/11/19  Antonietta Breach, PA-C    Allergies    Levofloxacin, Amoxicillin, and Latex  Review of Systems   Review of Systems  Ten systems reviewed and are negative for acute change, except as noted in the HPI.    Physical Exam Updated Vital Signs BP 131/84 (BP Location: Right Arm)   Pulse 78   Temp 98.6 F (37 C) (Oral)   Resp 15   Ht '5\' 5"'  (1.651 m)   Wt 68 kg   SpO2 98%   BMI 24.96 kg/m   Physical Exam Vitals and nursing note reviewed.  Constitutional:      General: He is not in acute distress.    Appearance: He is well-developed. He is not diaphoretic.  HENT:     Head: Normocephalic and atraumatic.  Eyes:     General: No scleral icterus.    Conjunctiva/sclera: Conjunctivae normal.  Pulmonary:     Effort: Pulmonary effort is normal. No respiratory distress.  Musculoskeletal:        General: Normal range of motion.     Cervical back: Normal range of motion.  Skin:    General: Skin is warm and dry.     Coloration: Skin is not pale.     Findings: No erythema or rash.  Neurological:     Mental Status: He is alert and oriented to person, place, and time.  Psychiatric:        Behavior: Behavior normal.     ED Results / Procedures / Treatments   Labs (all labs ordered are listed, but only abnormal results are displayed) Labs Reviewed  URINE CULTURE  URINALYSIS, ROUTINE W REFLEX MICROSCOPIC    EKG None  Radiology No results found.  Procedures Procedures (including critical care time)  Medications Ordered in ED Medications  sulfamethoxazole-trimethoprim (BACTRIM DS) 800-160 MG per tablet 1 tablet (has no  administration in time range)    ED Course  I have reviewed the triage vital signs and the nursing notes.  Pertinent labs & imaging results that were available during my care of the patient were reviewed by me and considered in my medical decision making (see chart for details).    MDM Rules/Calculators/A&P                      24 year old male with symptoms concerning for developing cystitis secondary to reusing his Foley catheters.  Reports urinary frequency, urgency, decreased stream. Urine culture sent. Will start on Bactrim based on past culture sensitivities. Encouraged PCP follow up. Return precautions discussed and provided. Patient discharged in stable condition with no unaddressed concerns.   Final Clinical Impression(s) / ED Diagnoses Final diagnoses:  Acute cystitis without hematuria    Rx / DC Orders ED Discharge Orders         Ordered    sulfamethoxazole-trimethoprim (BACTRIM DS) 800-160 MG tablet  2 times daily     05/04/19 2333    Catheters KIT     05/04/19 2333           Antonietta Breach, PA-C 05/05/19 0002    Palumbo, April, MD 05/05/19 0015

## 2019-05-04 NOTE — Discharge Instructions (Addendum)
Take the antibiotics prescribed to you until finished.  Follow-up with your primary care doctor.

## 2019-05-06 LAB — URINE CULTURE: Culture: 40000 — AB

## 2019-05-07 ENCOUNTER — Telehealth: Payer: Self-pay | Admitting: Emergency Medicine

## 2019-05-07 NOTE — Progress Notes (Signed)
ED Antimicrobial Stewardship Positive Culture Follow Up   Riley Simmons is an 24 y.o. male who presented to [facility] on 05/04/2019 with a chief complaint of urinary frequency and urgency.  Chief Complaint  Patient presents with  . Cystitis    Recent Results (from the past 720 hour(s))  Urine culture     Status: Abnormal   Collection Time: 05/04/19 10:40 PM   Specimen: Urine, Clean Catch  Result Value Ref Range Status   Specimen Description URINE, CLEAN CATCH  Final   Special Requests   Final    NONE Performed at [facility], [address] [place], [place] [address]    Culture 40,000 COLONIES/mL ENTEROCOCCUS FAECALIS (A)  Final   Report Status 05/06/2019 FINAL  Final   Organism ID, Bacteria ENTEROCOCCUS FAECALIS (A)  Final      Susceptibility   Enterococcus faecalis - MIC*    AMPICILLIN <=2 SENSITIVE Sensitive     NITROFURANTOIN <=16 SENSITIVE Sensitive     VANCOMYCIN 1 SENSITIVE Sensitive     * 40,000 COLONIES/mL ENTEROCOCCUS FAECALIS    [x]  Treated with bactrim, organism resistant to prescribed antimicrobial []  Patient discharged originally without antimicrobial agent and treatment is now indicated  Plan: 1) d/c bactrim 2) check for urinary symptoms. If symptomatic, then start macrobid 100 mg twice daily for 5 days  ED Provider: , PA-C   05/07/2019, 8:42 AM Clinical Pharmacist [phone]

## 2019-05-07 NOTE — Telephone Encounter (Signed)
Post ED Visit - Positive Culture Follow-up: Unsuccessful Patient Follow-up  Culture assessed and recommendations reviewed by:  []  , Pharm.D. []  Enzo Bi, Pharm.D., BCPS AQ-ID []  , Pharm.D., BCPS []  Celedonio Miyamoto, Pharm.D., BCPS [x]  , Pharm.D., BCPS, AAHIVP []  Garvin Fila, Pharm.D., BCPS, AAHIVP []  , PharmD []  Georgina Pillion, PharmD, BCPS  Positive urine culture  []  Patient discharged without antimicrobial prescription and treatment is now indicated [x]  Organism is resistant to prescribed ED discharge antimicrobial []  Patient with positive blood cultures   Unable to contact patient at phone number on file, letter will be sent to address on file  Plan: symptom check, if symptomatic: D/C Bactrim, Start Macrobid 100 mg PO twice daily x five days - PA  Dorna Leitz 05/07/2019, 2:46 PM

## 2019-07-24 ENCOUNTER — Encounter (HOSPITAL_COMMUNITY): Payer: Self-pay | Admitting: Emergency Medicine

## 2019-07-24 ENCOUNTER — Emergency Department (HOSPITAL_COMMUNITY)
Admission: EM | Admit: 2019-07-24 | Discharge: 2019-07-24 | Disposition: A | Discharge status: Home / Self Care | Payer: Self-pay | Attending: Emergency Medicine | Admitting: Emergency Medicine

## 2019-07-24 ENCOUNTER — Other Ambulatory Visit: Payer: Self-pay

## 2019-07-24 ENCOUNTER — Emergency Department (HOSPITAL_COMMUNITY): Payer: Self-pay

## 2019-07-24 DIAGNOSIS — Z880 Allergy status to penicillin: Secondary | ICD-10-CM | POA: Insufficient documentation

## 2019-07-24 DIAGNOSIS — R350 Frequency of micturition: Secondary | ICD-10-CM | POA: Insufficient documentation

## 2019-07-24 DIAGNOSIS — Z881 Allergy status to other antibiotic agents status: Secondary | ICD-10-CM | POA: Insufficient documentation

## 2019-07-24 DIAGNOSIS — Z91040 Latex allergy status: Secondary | ICD-10-CM | POA: Insufficient documentation

## 2019-07-24 DIAGNOSIS — Q059 Spina bifida, unspecified: Secondary | ICD-10-CM | POA: Insufficient documentation

## 2019-07-24 DIAGNOSIS — F17210 Nicotine dependence, cigarettes, uncomplicated: Secondary | ICD-10-CM | POA: Insufficient documentation

## 2019-07-24 DIAGNOSIS — N3001 Acute cystitis with hematuria: Secondary | ICD-10-CM | POA: Insufficient documentation

## 2019-07-24 DIAGNOSIS — R1032 Left lower quadrant pain: Secondary | ICD-10-CM | POA: Insufficient documentation

## 2019-07-24 LAB — URINALYSIS, ROUTINE W REFLEX MICROSCOPIC
Bacteria, UA: NONE SEEN
Bilirubin Urine: NEGATIVE
Glucose, UA: NEGATIVE mg/dL
Ketones, ur: NEGATIVE mg/dL
Nitrite: NEGATIVE
Protein, ur: NEGATIVE mg/dL
Specific Gravity, Urine: 1.017 (ref 1.005–1.030)
WBC, UA: >50 WBC/hpf — ABNORMAL HIGH (ref 0–5)
pH: 5 (ref 5.0–8.0)

## 2019-07-24 LAB — BASIC METABOLIC PANEL
Anion gap: 11 (ref 5–15)
BUN: 21 mg/dL — ABNORMAL HIGH (ref 6–20)
CO2: 21 mmol/L — ABNORMAL LOW (ref 22–32)
Calcium: 8.9 mg/dL (ref 8.9–10.3)
Chloride: 107 mmol/L (ref 98–111)
Creatinine, Ser: 0.98 mg/dL (ref 0.61–1.24)
GFR calc Af Amer: >60 mL/min (ref >60)
GFR calc non Af Amer: >60 mL/min (ref >60)
Glucose, Bld: 94 mg/dL (ref 70–99)
Potassium: 4.3 mmol/L (ref 3.5–5.1)
Sodium: 139 mmol/L (ref 135–145)

## 2019-07-24 LAB — CBC
HCT: 47.9 % (ref 39.0–52.0)
Hemoglobin: 15.9 g/dL (ref 13.0–17.0)
MCH: 31.4 pg (ref 26.0–34.0)
MCHC: 33.2 g/dL (ref 30.0–36.0)
MCV: 94.5 fL (ref 80.0–100.0)
Platelets: 304 10*3/uL (ref 150–400)
RBC: 5.07 MIL/uL (ref 4.22–5.81)
RDW: 12.5 % (ref 11.5–15.5)
WBC: 15.9 10*3/uL — ABNORMAL HIGH (ref 4.0–10.5)
nRBC: 0 % (ref 0.0–0.2)

## 2019-07-24 MED ORDER — SULFAMETHOXAZOLE-TRIMETHOPRIM 800-160 MG PO TABS: 1.0000 | ORAL_TABLET | Freq: Two times a day (BID) | ORAL | 0 refills | Status: AC

## 2019-07-24 MED ORDER — SULFAMETHOXAZOLE-TRIMETHOPRIM 800-160 MG PO TABS
1.0000 | ORAL_TABLET | Freq: Once | ORAL | Status: AC
  Administered 2019-07-24: 1 via ORAL
  Filled 2019-07-24: qty 1

## 2019-07-24 NOTE — ED Provider Notes (Signed)
[facility]-EMERGENCY DEPT Provider Note   CSN: [id number] Arrival date & time: 07/24/19  2030     History Chief Complaint  Patient presents with  . Flank Pain    Riley Simmons is a 23 y.o. male past medical history of spina bifida who self c catheterizes who presents for evaluation of urinary urgency and left flank pain that began yesterday.  He states that he frequently gets UTIs.  He states that he self caths secondary to his spina bifida process.  He reports that he has also had some left flank and left abdominal pain.  He has not noted any fevers at home.  He denies any nausea/vomiting.  He states he has not had any chest pain or difficulty breathing.  His last UTI was about 5 months ago.  The history is provided by the patient.       Past Medical History:  Diagnosis Date  . Marijuana smoker, continuous   . Scoliosis   . Spina bifida (HCC)   . Urethritis     Patient Active Problem List   Diagnosis Date Noted  . Spina bifida (HCC) 09/20/2017  . Neurogenic bladder 09/20/2017  . Leukocytosis   . Pain in right testicle   . Epididymitis 08/16/2017    Past Surgical History:  Procedure Laterality Date  . BACK SURGERY     as a baby due to spina bifida   . BLADDER SURGERY         Family History  Problem Relation Age of Onset  . Deafness Mother     Social History   Tobacco Use  . Smoking status: Current Some Day Smoker    Packs/day: 0.50    Types: Cigarettes  . Smokeless tobacco: Never Used  Substance Use Topics  . Alcohol use: Not Currently  . Drug use: Yes    Types: Marijuana    Comment: daily    Home Medications Prior to Admission medications   Medication Sig Start Date End Date Taking? Authorizing Provider  Catheters KIT Coloplast 12 french Provide 30 self catheterization kits for PRN catheterization 05/04/19   Humes, Kelly, PA-C  dextromethorphan-guaiFENesin (MUCINEX DM) 30-600 MG 12hr tablet Take 1 tablet by mouth 2 (two)  times daily. Patient not taking: Reported on 03/20/2019 11/14/18   Zackowski, Scott, MD  sulfamethoxazole-trimethoprim (BACTRIM DS) 800-160 MG tablet Take 1 tablet by mouth 2 (two) times daily for 7 days. 07/24/19 07/31/19  Layden, Lindsey A, PA-C    Allergies    Levofloxacin, Amoxicillin, and Latex  Review of Systems   Review of Systems  Constitutional: Negative for fever.  Respiratory: Negative for cough and shortness of breath.   Cardiovascular: Negative for chest pain.  Gastrointestinal: Positive for abdominal pain. Negative for nausea and vomiting.  Genitourinary: Positive for flank pain and frequency. Negative for dysuria and hematuria.  Neurological: Negative for headaches.  All other systems reviewed and are negative.   Physical Exam Updated Vital Signs BP (!) 140/101 (BP Location: Left Arm)   Pulse 83   Temp 99 F (37.2 C) (Oral)   Resp 16   Ht 5' 6" (1.676 m)   Wt 70.8 kg   SpO2 99%   BMI 25.20 kg/m   Physical Exam Vitals and nursing note reviewed.  Constitutional:      Appearance: Normal appearance. He is well-developed.     Comments: Sitting comfortably on examination table  HENT:     Head: Normocephalic and atraumatic.  Eyes:       General: Lids are normal.     Conjunctiva/sclera: Conjunctivae normal.     Pupils: Pupils are equal, round, and reactive to light.  Cardiovascular:     Rate and Rhythm: Normal rate and regular rhythm.     Pulses: Normal pulses.     Heart sounds: Normal heart sounds. No murmur. No friction rub. No gallop.   Pulmonary:     Effort: Pulmonary effort is normal.     Breath sounds: Normal breath sounds.     Comments: Lungs clear to auscultation bilaterally.  Symmetric chest rise.  No wheezing, rales, rhonchi. Abdominal:     Palpations: Abdomen is soft. Abdomen is not rigid.     Tenderness: There is abdominal tenderness in the left lower quadrant. There is left CVA tenderness. There is no guarding.     Comments: Abdomen soft,  nondistended.  Tenderness palpation on the left lower quadrant as well as left-sided CVA tenderness.  No rigidity, guarding.  Musculoskeletal:        General: Normal range of motion.     Cervical back: Full passive range of motion without pain.  Skin:    General: Skin is warm and dry.     Capillary Refill: Capillary refill takes less than 2 seconds.  Neurological:     Mental Status: He is alert and oriented to person, place, and time.  Psychiatric:        Speech: Speech normal.     ED Results / Procedures / Treatments   Labs (all labs ordered are listed, but only abnormal results are displayed) Labs Reviewed  URINALYSIS, ROUTINE W REFLEX MICROSCOPIC - Abnormal; Notable for the following components:      Result Value   Hgb urine dipstick SMALL (*)    Leukocytes,Ua LARGE (*)    WBC, UA >50 (*)    All other components within normal limits  BASIC METABOLIC PANEL - Abnormal; Notable for the following components:   CO2 21 (*)    BUN 21 (*)    All other components within normal limits  CBC - Abnormal; Notable for the following components:   WBC 15.9 (*)    All other components within normal limits  URINE CULTURE    EKG None  Radiology No results found.  Procedures Procedures (including critical care time)  Medications Ordered in ED Medications  sulfamethoxazole-trimethoprim (BACTRIM DS) 800-160 MG per tablet 1 tablet (has no administration in time range)    ED Course  I have reviewed the triage vital signs and the nursing notes.  Pertinent labs & imaging results that were available during my care of the patient were reviewed by me and considered in my medical decision making (see chart for details).    MDM Rules/Calculators/A&P                      23-year-old male who presents for evaluation of left lower abdominal and left flank pain as well as increased urinary urgency.  He reports he gets frequent UTIs secondary to self cathing that he has to do for spina bifida.   On initially arrival, he is afebrile, nontoxic-appearing.  Vital signs are stable.  On exam, he has left-sided CVA tenderness, left lower abdominal tenderness.  Concern for GU etiology.  Labs and urine ordered at triage.  BMP shows normal BUN and creatinine.  CBC shows slight leukocytosis of 16.9.  UA shows large leukocytes with pyuria.  Urine culture sent.  Given UTI as well as flank pain, will   plan for imaging to ensure there is no stone that would be contributing to symptoms.  I reviewed his most recent urine culture which showed 40,000 colonies of Enterococcus.  This was sensitive to ampicillin, Macrobid and vancomycin.  He also had a urine culture done on 03/20/2019 that was sensitive to Bactrim, Cipro, Rocephin.  Given evidence of infectious etiology on urine as well as abdominal and flank pain.  I wanted to do a CT scan for evaluation of kidney stone.  I discussed results with patient and plan.  After extensive discussion, patient states that he did not wish to have a CT scan at this time.  We discussed extensively regarding the risk versus benefits of not obtaining a CT scan, including but not limited to missed condition that could cause worsening condition.  After this discussion, patient states that he did not want to have a CT scan.  He wanted to be treated for his UTI only.  Patient is clinically sober and exhibits full medical decision-making capacity.  He understands that by declining the CT scan, there could be a missed diagnosis.  He expresses understanding.  I reviewed his records.  He has been sensitive to Bactrim in the past.  He is taking it without any difficulty.  We will plan to start him on Bactrim.  Patient encouraged at home supportive care measures. Patient had ample opportunity for questions and discussion. All patient's questions were answered with full understanding. Strict return precautions discussed. Patient expresses understanding and agreement to plan.   Portions of this  note were generated with Dragon dictation software. Dictation errors may occur despite best attempts at proofreading.  Final Clinical Impression(s) / ED Diagnoses Final diagnoses:  Acute cystitis with hematuria    Rx / DC Orders ED Discharge Orders         Ordered    sulfamethoxazole-trimethoprim (BACTRIM DS) 800-160 MG tablet  2 times daily     07/24/19 2209           Layden, Lindsey A, PA-C 07/24/19 2212    Butler, Michael C, MD 07/25/19 0938  

## 2019-07-24 NOTE — ED Triage Notes (Signed)
Patient complaining of left flank pain that started yesterday. Patient states that he is having urine urgency.

## 2019-07-24 NOTE — Discharge Instructions (Signed)
Take antibiotics as directed. Please take all of your antibiotics until finished.  Follow-up with [facility] to establish a primary care doctor if you do not have one.   Return the emergency department for any worsening pain, fever, nausea/vomiting or any other worsening or concerning symptoms.

## 2019-07-25 LAB — URINE CULTURE: Culture: <10000 — AB

## 2019-08-11 ENCOUNTER — Other Ambulatory Visit: Payer: Self-pay

## 2019-08-11 ENCOUNTER — Encounter (HOSPITAL_COMMUNITY): Payer: Self-pay

## 2019-08-11 ENCOUNTER — Ambulatory Visit (HOSPITAL_COMMUNITY)
Admission: EM | Admit: 2019-08-11 | Discharge: 2019-08-11 | Disposition: A | Discharge status: Home / Self Care | Payer: Self-pay | Attending: Emergency Medicine | Admitting: Emergency Medicine

## 2019-08-11 DIAGNOSIS — R3915 Urgency of urination: Secondary | ICD-10-CM | POA: Insufficient documentation

## 2019-08-11 DIAGNOSIS — R350 Frequency of micturition: Secondary | ICD-10-CM

## 2019-08-11 LAB — POCT URINALYSIS DIP (DEVICE)
Bilirubin Urine: NEGATIVE
Glucose, UA: NEGATIVE mg/dL
Hgb urine dipstick: NEGATIVE
Ketones, ur: NEGATIVE mg/dL
Leukocytes,Ua: NEGATIVE
Nitrite: NEGATIVE
Protein, ur: NEGATIVE mg/dL
Specific Gravity, Urine: 1.025 (ref 1.005–1.030)
Urobilinogen, UA: 0.2 mg/dL (ref 0.0–1.0)
pH: 6 (ref 5.0–8.0)

## 2019-08-11 MED ORDER — CATHETERS KIT: PACK | 1 refills | Status: DC

## 2019-08-11 NOTE — ED Provider Notes (Signed)
[facility]    CSN: [id number] Arrival date & time: 08/11/19  1329      History   Chief Complaint Chief Complaint  Patient presents with  . Urinary Frequency    HPI Riley Simmons is a 24 y.o. male with history of marijuana use, scoliosis, spina bifida (performs self catheter) presenting for 2-week course of intermittent urinary urgency.  Denies frequency, dysuria, blood in urine.  Patient states sometimes he notes an odor.  Denies penile pain, discharge, testicular pain or swelling.  No abdominal, back pain, fever.  Patient seen in ER earlier this month for similar concern, though had back pain at the time: Please see MDM for further details.  Patient states that symptoms onset he has already obtain STD testing: Negative.  Sexually active with 1 male partner.   Past Medical History:  Diagnosis Date  . Marijuana smoker, continuous   . Scoliosis   . Spina bifida ([place])   . Urethritis     Patient Active Problem List   Diagnosis Date Noted  . Spina bifida ([place]) 09/20/2017  . Neurogenic bladder 09/20/2017  . Leukocytosis   . Pain in right testicle   . Epididymitis 08/16/2017    Past Surgical History:  Procedure Laterality Date  . BACK SURGERY     as a baby due to spina bifida   . BLADDER SURGERY         Home Medications    Prior to Admission medications   Medication Sig Start Date End Date Taking? Authorizing Provider  Catheters KIT Coloplast 12 french Provide 30 self catheterization kits for PRN catheterization 08/11/19   Hall-Potvin, [place], PA-C    Family History Family History  Problem Relation Age of Onset  . Deafness Mother     Social History Social History   Tobacco Use  . Smoking status: Current Some Day Smoker    Packs/day: 0.50    Types: Cigarettes  . Smokeless tobacco: Never Used  Vaping Use  . Vaping Use: Never used  Substance Use Topics  . Alcohol use: Not Currently  . Drug use: Yes    Types: Marijuana    Comment:  daily     Allergies   Levofloxacin, Amoxicillin, and Latex   Review of Systems As per HPI   Physical Exam Triage Vital Signs ED Triage Vitals  Enc Vitals Group     BP 08/11/19 1346 109/71     Pulse Rate 08/11/19 1346 92     Resp 08/11/19 1346 17     Temp 08/11/19 1346 98.6 F (37 C)     Temp Source 08/11/19 1346 Oral     SpO2 08/11/19 1346 98 %     Weight 08/11/19 1343 160 lb (72.6 kg)     Height --      Head Circumference --      Peak Flow --      Pain Score 08/11/19 1343 0     Pain Loc --      Pain Edu? --      Excl. in [place]? --    No data found.  Updated Vital Signs BP 109/71 (BP Location: Right Arm)   Pulse 92   Temp 98.6 F (37 C) (Oral)   Resp 17   Wt 160 lb (72.6 kg)   SpO2 98%   BMI 25.82 kg/m   Visual Acuity Right Eye Distance:   Left Eye Distance:   Bilateral Distance:    Right Eye Near:   Left Eye  Near:    Bilateral Near:     Physical Exam Constitutional:      General: He is not in acute distress. HENT:     Head: Normocephalic and atraumatic.  Eyes:     General: No scleral icterus.    Pupils: Pupils are equal, round, and reactive to light.  Cardiovascular:     Rate and Rhythm: Normal rate.  Pulmonary:     Effort: Pulmonary effort is normal. No respiratory distress.     Breath sounds: No wheezing.  Abdominal:     General: Abdomen is flat.     Palpations: Abdomen is soft.     Tenderness: There is no abdominal tenderness. There is no right CVA tenderness or left CVA tenderness.  Skin:    Coloration: Skin is not jaundiced or pale.  Neurological:     Mental Status: He is alert and oriented to person, place, and time.      UC Treatments / Results  Labs (all labs ordered are listed, but only abnormal results are displayed) Labs Reviewed  URINE CULTURE  POCT URINALYSIS DIP (DEVICE)    EKG   Radiology No results found.  Procedures Procedures (including critical care time)  Medications Ordered in UC Medications - No data  to display  Initial Impression / Assessment and Plan / UC Course  I have reviewed the triage vital signs and the nursing notes.  Pertinent labs & imaging results that were available during my care of the patient were reviewed by me and considered in my medical decision making (see chart for details).    Patient febrile, nontoxic in office today.  Hemodynamically stable.  Per chart review, patient seen 07/24/2019 in ER for acute cystitis with hematuria: Given Bactrim upon discharge due to previous urine culture positive for Enterococcus sensitive to ampicillin, Macrobid, vancomycin.  Urine culture obtained at that time <10,000 colonies per mL insignificant growth.  Patient states Bactrim resolved issues and he completed course.  Denies antibiotic use since symptom onset this time.  Given patient's comorbidities/risk factors will send urine for culture despite unremarkable findings.  Provided refill of self-catheterization kit as well as contact information for urology as patient will require further management of chronic disease.  Return precautions discussed, patient verbalized understanding and is agreeable to plan. Final Clinical Impressions(s) / UC Diagnoses   Final diagnoses:  Urinary urgency     Discharge Instructions     Avoid advil/ibuprofen/motrin, aleve/naproxen. Important to drink plenty of water throughout the day. Urine culture pending: check MyChart for results. Important to follow up with urology for further management. Return for worsening urinary symptoms, blood in urine, abdominal or back pain, fever.    ED Prescriptions    Medication Sig Dispense Auth. Provider   Catheters KIT Coloplast 12 french Provide 30 self catheterization kits for PRN catheterization 30 kit Hall-Potvin, [place], PA-C     PDMP not reviewed this encounter.   Hall-Potvin, [place], [place] 08/11/19 1443

## 2019-08-11 NOTE — Discharge Instructions (Addendum)
Avoid advil/ibuprofen/motrin, aleve/naproxen. Important to drink plenty of water throughout the day. Urine culture pending: check MyChart for results. Important to follow up with urology for further management. Return for worsening urinary symptoms, blood in urine, abdominal or back pain, fever.

## 2019-08-11 NOTE — ED Triage Notes (Addendum)
Pt is here with Spina bifida so he self cath's himself & had to reuse a Catherer recently when running low on funds and states he caught an infection from that most likely. He is confirmation urination frequency. Pt also is requesting a Librarian, academic refill.

## 2019-08-13 LAB — URINE CULTURE: Culture: 20000 — AB

## 2019-08-14 ENCOUNTER — Telehealth (HOSPITAL_COMMUNITY): Payer: Self-pay | Admitting: Orthopedic Surgery

## 2019-08-14 MED ORDER — NITROFURANTOIN MONOHYD MACRO 100 MG PO CAPS: 100.0000 mg | ORAL_CAPSULE | Freq: Two times a day (BID) | ORAL | 0 refills | Status: DC

## 2019-09-29 ENCOUNTER — Telehealth: Payer: MEDICAID | Admitting: Nurse Practitioner

## 2019-09-29 DIAGNOSIS — J40 Bronchitis, not specified as acute or chronic: Secondary | ICD-10-CM

## 2019-09-29 MED ORDER — AZITHROMYCIN 250 MG PO TABS: ORAL_TABLET | ORAL | 0 refills | Status: DC

## 2019-09-29 MED ORDER — BENZONATATE 100 MG PO CAPS: 100.0000 mg | ORAL_CAPSULE | Freq: Three times a day (TID) | ORAL | 0 refills | Status: DC | PRN

## 2019-09-29 NOTE — Progress Notes (Signed)
We are sorry that you are not feeling well.  Here is how we plan to help!  Based on your presentation I believe you most likely have A cough due to bacteria.  When patients have a fever and a productive cough with a change in color or increased sputum production, we are concerned about bacterial bronchitis.  If left untreated it can progress to pneumonia.  If your symptoms do not improve with your treatment plan it is important that you contact your provider.   I have prescribed Azithromyin 250 mg: two tablets now and then one tablet daily for 4 additonal days    In addition you may use A prescription cough medication called Tessalon Perles 100mg. You may take 1-2 capsules every 8 hours as needed for your cough.   From your responses in the eVisit questionnaire you describe inflammation in the upper respiratory tract which is causing a significant cough.  This is commonly called Bronchitis and has four common causes:    Allergies  Viral Infections  Acid Reflux  Bacterial Infection Allergies, viruses and acid reflux are treated by controlling symptoms or eliminating the cause. An example might be a cough caused by taking certain blood pressure medications. You stop the cough by changing the medication. Another example might be a cough caused by acid reflux. Controlling the reflux helps control the cough.  USE OF BRONCHODILATOR ("RESCUE") INHALERS: There is a risk from using your bronchodilator too frequently.  The risk is that over-reliance on a medication which only relaxes the muscles surrounding the breathing tubes can reduce the effectiveness of medications prescribed to reduce swelling and congestion of the tubes themselves.  Although you feel brief relief from the bronchodilator inhaler, your asthma may actually be worsening with the tubes becoming more swollen and filled with mucus.  This can delay other crucial treatments, such as oral steroid medications. If you need to use a  bronchodilator inhaler daily, several times per day, you should discuss this with your provider.  There are probably better treatments that could be used to keep your asthma under control.     HOME CARE . Only take medications as instructed by your medical team. . Complete the entire course of an antibiotic. . Drink plenty of fluids and get plenty of rest. . Avoid close contacts especially the very young and the elderly . Cover your mouth if you cough or cough into your sleeve. . Always remember to wash your hands . A steam or ultrasonic humidifier can help congestion.   GET HELP RIGHT AWAY IF: . You develop worsening fever. . You become short of breath . You cough up blood. . Your symptoms persist after you have completed your treatment plan MAKE SURE YOU   Understand these instructions.  Will watch your condition.  Will get help right away if you are not doing well or get worse.  Your e-visit answers were reviewed by a board certified advanced clinical practitioner to complete your personal care plan.  Depending on the condition, your plan could have included both over the counter or prescription medications. If there is a problem please reply  once you have received a response from your provider. Your safety is important to us.  If you have drug allergies check your prescription carefully.    You can use MyChart to ask questions about today's visit, request a non-urgent call back, or ask for a work or school excuse for 24 hours related to this e-Visit. If it has been   greater than 24 hours you will need to follow up with your provider, or enter a new e-Visit to address those concerns. You will get an e-mail in the next two days asking about your experience.  I hope that your e-visit has been valuable and will speed your recovery. Thank you for using e-visits.  5-10 minutes spent reviewing and documenting in chart.  

## 2019-11-01 ENCOUNTER — Telehealth: Payer: MEDICAID | Admitting: Nurse Practitioner

## 2019-11-01 DIAGNOSIS — R399 Unspecified symptoms and signs involving the genitourinary system: Secondary | ICD-10-CM

## 2019-11-01 DIAGNOSIS — M545 Low back pain, unspecified: Secondary | ICD-10-CM

## 2019-11-01 NOTE — Progress Notes (Signed)
Based on what you shared with me it looks like you have urinary symptoms with associated back pain,that should be evaluated in a face to face office visit. You will need a face to face visit in order to have  urinalysis and urine culture done for proper treatment.    NOTE: If you entered your credit card information for this eVisit, you will not be charged. You may see a "hold" on your card for the $35 but that hold will drop off and you will not have a charge processed.  If you are having a true medical emergency please call 911.     For an urgent face to face visit, [facility] has four urgent care centers for your convenience:   . [facility]    [phone]                  Get Driving Directions  [address] [place], [place] [address] . 10 am to 8 pm Monday-Friday . 12 pm to 8 pm Saturday-Sunday   . [facility]  [phone]                  Get Driving Directions  [address], [address] [place], [place] [address] . 8 am to 8 pm Monday-Friday . 9 am to 6 pm Saturday . 11 am to 6 pm Sunday   . [facility] [place]  [phone]                  Get Driving Directions   [address] [place], [place] [address] . 8 am to 8 pm Monday-Friday . 8 am to 4 pm Saturday-Sunday    . [facility]                    Get Driving Directions  [phone]  [address] [place], [place] [address]  . Monday-Friday, 12 PM to 6 PM    Your e-visit answers were reviewed by a board certified advanced clinical practitioner to complete your personal care plan.  Thank you for using e-Visits.   

## 2019-11-01 NOTE — Progress Notes (Signed)
I am sorry but I can not change my response from previous evisit. You will need a face to face visit.

## 2019-11-10 ENCOUNTER — Telehealth: Payer: MEDICAID | Admitting: Physician Assistant

## 2019-11-10 ENCOUNTER — Encounter: Payer: Self-pay | Admitting: Physician Assistant

## 2019-11-10 DIAGNOSIS — Z20818 Contact with and (suspected) exposure to other bacterial communicable diseases: Secondary | ICD-10-CM

## 2019-11-10 DIAGNOSIS — J028 Acute pharyngitis due to other specified organisms: Secondary | ICD-10-CM

## 2019-11-10 DIAGNOSIS — Z1152 Encounter for screening for COVID-19: Secondary | ICD-10-CM

## 2019-11-10 MED ORDER — AZITHROMYCIN 250 MG PO TABS: ORAL_TABLET | ORAL | 0 refills | Status: DC

## 2019-11-10 NOTE — Progress Notes (Signed)
E-Visit for Corona Virus Screening  Your current symptoms could be consistent with the coronavirus.  Many health care providers can now test patients at their office but not all are.  [facility] has multiple testing sites. For information on our COVID testing locations and hours go to [email or web address]  We are enrolling you in our MyChart Home Monitoring for COVID19 . Daily you will receive a questionnaire within the MyChart website. Our COVID 19 response team will be monitoring your responses daily.  Testing Information: The COVID-19 Community Testing sites are testing BY APPOINTMENT ONLY.  You can schedule online at [email or web address]  If you do not have access to a smart phone or computer you may call [phone] for an appointment.   Additional testing sites in the Community:  . For [facility] Testing sites in [facility]  FarmerBuys.[email or web address]  . For Pop-up testing sites in [place]  [email or web address]  . For [facility] [email or web address].dk  . For [facility] testing in [place] and [employer] [email or web address].dk  . For [facility] testing in [facility]   [email or web address]  For  more information about community testing call [phone]  I have prescribed antibiotics for your sore throat due to your exposure to strep Based on what you have shared with me it is likely that you have strep pharyngitis.  Strep pharyngitis is inflammation and infection in the back of the throat.  This is an infection cause by bacteria and is treated with antibiotics.  I have prescribed Azithromycin 250 mg, 2 tablets today, and one tablet days 2-5. The  antibiotics has been prescribed due to your exposure to strep throat.  For throat pain, we recommend over the counter oral pain relief medications such as acetaminophen or aspirin, or anti-inflammatory medications such as ibuprofen or naproxen sodium. Topical treatments such as oral throat lozenges or sprays may be used as needed. Strep infections are not as easily transmitted as other respiratory infections, however we still recommend that you avoid close contact with loved ones, especially the very young and elderly.  Remember to wash your hands thoroughly throughout the day as this is the number one way to prevent the spread of infection and wipe down door knobs and counters with disinfectant.   Please quarantine yourself while awaiting your test results. Please stay home for a minimum of 10 days from the first day of illness with improving symptoms and you have had 24 hours of no fever (without the use of Tylenol (Acetaminophen) Motrin (Ibuprofen) or any fever reducing medication).  Also - Do not get tested prior to returning to work because once you have had a positive test the test can stay positive for more than a month in some cases.   You should wear a mask or cloth face covering over your nose and mouth if you must be around other people or animals, including pets (even at home). Try to stay at least 6 feet away from other people. This will protect the people around you.  Please continue good preventive care measures, including:  frequent hand-washing, avoid touching your face, cover coughs/sneezes, stay out of crowds and keep a 6 foot distance from others.  COVID-19 is a respiratory illness with symptoms that are similar to the flu. Symptoms are typically mild to moderate, but there have been cases of severe illness and death due to the virus.   The following symptoms may appear 2-14 days after exposure: . Fever . Cough . Shortness of breath or difficulty breathing .  Chills . Repeated shaking  with chills . Muscle pain . Headache . Sore throat . New loss of taste or smell . Fatigue . Congestion or runny nose . Nausea or vomiting . Diarrhea  Go to the nearest hospital ED for assessment if fever/cough/breathlessness are severe or illness seems like a threat to life.  It is vitally important that if you feel that you have an infection such as this virus or any other virus that you stay home and away from places where you may spread it to others.  You should avoid contact with people age 24 and older.   You can use medication such as A prescription cough medication called Tessalon Perles 100 mg. You may take 1-2 capsules every 8 hours as needed for cough  You may also take acetaminophen (Tylenol) as needed for fever.  I have also provided a work note  Reduce your risk of any infection by using the same precautions used for avoiding the common cold or flu:  Marland Kitchen Wash your hands often with soap and warm water for at least 20 seconds.  If soap and water are not readily available, use an alcohol-based hand sanitizer with at least 60% alcohol.  . If coughing or sneezing, cover your mouth and nose by coughing or sneezing into the elbow areas of your shirt or coat, into a tissue or into your sleeve (not your hands). . Avoid shaking hands with others and consider head nods or verbal greetings only. . Avoid touching your eyes, nose, or mouth with unwashed hands.  . Avoid close contact with people who are sick. . Avoid places or events with large numbers of people in one location, like concerts or sporting events. . Carefully consider travel plans you have or are making. . If you are planning any travel outside or inside the [place], visit the CDC's Travelers' Health webpage for the latest health notices. . If you have some symptoms but not all symptoms, continue to monitor at home and seek medical attention if your symptoms worsen. . If you are having a medical emergency, call 911.  HOME  CARE . Only take medications as instructed by your medical team. . Drink plenty of fluids and get plenty of rest. . A steam or ultrasonic humidifier can help if you have congestion.   GET HELP RIGHT AWAY IF YOU HAVE EMERGENCY WARNING SIGNS** FOR COVID-19. If you or someone is showing any of these signs seek emergency medical care immediately. Call 911 or proceed to your closest emergency facility if: . You develop worsening high fever. . Trouble breathing . Bluish lips or face . Persistent pain or pressure in the chest . New confusion . Inability to wake or stay awake . You cough up blood. . Your symptoms become more severe  **This list is not all possible symptoms. Contact your medical provider for any symptoms that are sever or concerning to you.  MAKE SURE YOU   Understand these instructions.  Will watch your condition.  Will get help right away if you are not doing well or get worse.  Your e-visit answers were reviewed by a board certified advanced clinical practitioner to complete your personal care plan.  Depending on the condition, your plan could have included both over the counter or prescription medications.  If there is a problem please reply once you have received a response from your provider.  Your safety is important to [place].  If you have drug allergies check your prescription carefully.  You can use MyChart to ask questions about today's visit, request a non-urgent call back, or ask for a work or school excuse for 24 hours related to this e-Visit. If it has been greater than 24 hours you will need to follow up with your provider, or enter a new e-Visit to address those concerns. You will get an e-mail in the next two days asking about your experience.  I hope that your e-visit has been valuable and will speed your recovery. Thank you for using e-visits.   I spent 5-10 minutes on review and completion of this note- Illa Level [facility]

## 2019-12-03 ENCOUNTER — Telehealth: Payer: MEDICAID | Admitting: Physician Assistant

## 2019-12-03 DIAGNOSIS — N3000 Acute cystitis without hematuria: Secondary | ICD-10-CM

## 2019-12-03 NOTE — Progress Notes (Signed)
Based on what you shared with me, I feel your condition warrants further evaluation and I recommend that you be seen for a face to face office visit.     [name],  It is recommended that you have a face to face evaluation of your symptoms so proper workup, diagnosis, and treatment is done.  NOTE: If you entered your credit card information for this eVisit, you will not be charged. You may see a "hold" on your card for the $35 but that hold will drop off and you will not have a charge processed.   If you are having a true medical emergency please call 911.      For an urgent face to face visit, [place] has five urgent care centers for your convenience:     [facility] at [facility] Directions [phone] [address] [place], [place] [address] . 10 am - 6pm Monday - Friday    [facility]) Get Driving Directions [phone] [address] [place], [place] [address] . 10 am to 8 pm Monday-Friday . 12 pm to 8 pm [facility] Urgent Care at [facility] Get Driving Directions [phone] [address], [address] [place], [place] [address] . 8 am to 8 pm Monday-Friday . 9 am to 6 pm Saturday . 11 am to 6 pm Sunday     [facility] at [facility] Get Driving Directions  [phone] [address] [place], [place] [address] . 8 am to 8 pm Monday-Friday . 8 am to 4 pm [facility] Urgent Care at [facility] Directions [phone] [address] [place], [place] [address] . 12 pm to 6 pm Monday-Friday      Your e-visit answers were reviewed by a board certified advanced clinical practitioner to complete your personal care plan.  Thank you for using e-Visits.    I spent 5-10 minutes on review and completion of this note- Illa Level [facility]

## 2019-12-07 ENCOUNTER — Other Ambulatory Visit: Payer: Self-pay

## 2019-12-07 ENCOUNTER — Ambulatory Visit (HOSPITAL_COMMUNITY)
Admission: EM | Admit: 2019-12-07 | Discharge: 2019-12-07 | Disposition: A | Discharge status: Home / Self Care | Payer: Self-pay | Attending: Family Medicine | Admitting: Family Medicine

## 2019-12-07 ENCOUNTER — Encounter (HOSPITAL_COMMUNITY): Payer: Self-pay

## 2019-12-07 DIAGNOSIS — N390 Urinary tract infection, site not specified: Secondary | ICD-10-CM | POA: Insufficient documentation

## 2019-12-07 LAB — POCT URINALYSIS DIPSTICK, ED / UC
Bilirubin Urine: NEGATIVE
Glucose, UA: NEGATIVE mg/dL
Ketones, ur: NEGATIVE mg/dL
Nitrite: NEGATIVE
Protein, ur: NEGATIVE mg/dL
Specific Gravity, Urine: >=1.03 (ref 1.005–1.030)
Urobilinogen, UA: 0.2 mg/dL (ref 0.0–1.0)
pH: 5.5 (ref 5.0–8.0)

## 2019-12-07 MED ORDER — SULFAMETHOXAZOLE-TRIMETHOPRIM 800-160 MG PO TABS: 1.0000 | ORAL_TABLET | Freq: Two times a day (BID) | ORAL | 0 refills | Status: AC

## 2019-12-07 MED ORDER — CATHETERS KIT: PACK | 1 refills | Status: DC

## 2019-12-07 NOTE — Discharge Instructions (Addendum)
Treating you for UTI Sending for culture  Follow up as needed for continued or worsening symptoms

## 2019-12-07 NOTE — ED Triage Notes (Signed)
Pt presents with urinary urgency and foul smell to urine X 4 days.

## 2019-12-08 NOTE — ED Provider Notes (Signed)
[place]    CSN: [id number] Arrival date & time: 12/07/19  0827      History   Chief Complaint Chief Complaint  Patient presents with  . Urinary Tract Infection    HPI Riley Simmons is a 24 y.o. male.   Patient is a 24 year old male with past medical history of scoliosis, spina bifida, neurogenic bladder and chronic urinary tract infections.  He presents today with urinary urgency and foul smell to urine x4 days.  Reporting he has been having to reuse his urinary catheters to self cath due to cost issues.  Denies any dysuria, hematuria or fevers.  No concern for STDs.     Past Medical History:  Diagnosis Date  . Marijuana smoker, continuous   . Scoliosis   . Spina bifida ([place])   . Urethritis     Patient Active Problem List   Diagnosis Date Noted  . Spina bifida ([place]) 09/20/2017  . Neurogenic bladder 09/20/2017  . Leukocytosis   . Pain in right testicle   . Epididymitis 08/16/2017    Past Surgical History:  Procedure Laterality Date  . BACK SURGERY     as a baby due to spina bifida   . BLADDER SURGERY         Home Medications    Prior to Admission medications   Medication Sig Start Date End Date Taking? Authorizing Provider  benzonatate (TESSALON PERLES) 100 MG capsule Take 1 capsule (100 mg total) by mouth 3 (three) times daily as needed for cough. 09/29/19   Chevis Pretty, FNP  Catheters KIT Coloplast 12 french Provide 30 self catheterization kits for PRN catheterization 12/07/19   Loura Halt A, NP  sulfamethoxazole-trimethoprim (BACTRIM DS) 800-160 MG tablet Take 1 tablet by mouth 2 (two) times daily for 7 days. 12/07/19 12/14/19  Orvan July, NP    Family History Family History  Problem Relation Age of Onset  . Deafness Mother     Social History Social History   Tobacco Use  . Smoking status: Current Some Day Smoker    Packs/day: 0.50    Types: Cigarettes  . Smokeless tobacco: Never Used  Vaping Use  . Vaping  Use: Never used  Substance Use Topics  . Alcohol use: Not Currently  . Drug use: Yes    Types: Marijuana    Comment: daily     Allergies   Levofloxacin, Amoxicillin, and Latex   Review of Systems Review of Systems   Physical Exam Triage Vital Signs ED Triage Vitals [12/07/19 0850]  Enc Vitals Group     BP (!) 142/101     Pulse Rate 77     Resp 17     Temp 97.9 F (36.6 C)     Temp Source Oral     SpO2 99 %     Weight      Height      Head Circumference      Peak Flow      Pain Score 0     Pain Loc      Pain Edu?      Excl. in [place]?    No data found.  Updated Vital Signs BP (!) 142/101 (BP Location: Left Arm)   Pulse 77   Temp 97.9 F (36.6 C) (Oral)   Resp 17   SpO2 99%   Visual Acuity Right Eye Distance:   Left Eye Distance:   Bilateral Distance:    Right Eye Near:   Left Eye Near:  Bilateral Near:     Physical Exam Vitals and nursing note reviewed.  Constitutional:      Appearance: Normal appearance.  HENT:     Head: Normocephalic and atraumatic.  Eyes:     Conjunctiva/sclera: Conjunctivae normal.  Pulmonary:     Effort: Pulmonary effort is normal.  Abdominal:     Palpations: Abdomen is soft.  Musculoskeletal:        General: Normal range of motion.     Cervical back: Normal range of motion.  Skin:    General: Skin is warm and dry.  Neurological:     Mental Status: He is alert.  Psychiatric:        Mood and Affect: Mood normal.      UC Treatments / Results  Labs (all labs ordered are listed, but only abnormal results are displayed) Labs Reviewed  URINE CULTURE - Abnormal; Notable for the following components:      Result Value   Culture   (*)    Value: 80,000 COLONIES/mL ENTEROCOCCUS FAECALIS SUSCEPTIBILITIES TO FOLLOW Performed at [facility], [address], [place], [place] [address]    All other components within normal limits  POCT URINALYSIS DIPSTICK, ED / UC - Abnormal; Notable for the following components:     Hgb urine dipstick TRACE (*)    Leukocytes,Ua TRACE (*)    All other components within normal limits    EKG   Radiology No results found.  Procedures Procedures (including critical care time)  Medications Ordered in UC Medications - No data to display  Initial Impression / Assessment and Plan / UC Course  I have reviewed the triage vital signs and the nursing notes.  Pertinent labs & imaging results that were available during my care of the patient were reviewed by me and considered in my medical decision making (see chart for details).     Chronic UTI Urine with trace leuks and trace hemoglobin. Sending for culture.  We will go ahead and cover with Bactrim pending culture. Recommended increase fluids Follow up as needed for continued or worsening symptoms  Final Clinical Impressions(s) / UC Diagnoses   Final diagnoses:  Chronic UTI (urinary tract infection)     Discharge Instructions     Treating you for UTI Sending for culture  Follow up as needed for continued or worsening symptoms      ED Prescriptions    Medication Sig Dispense Auth. Provider   Catheters KIT Coloplast 12 french Provide 30 self catheterization kits for PRN catheterization 30 kit Lurena Naeve A, NP   sulfamethoxazole-trimethoprim (BACTRIM DS) 800-160 MG tablet Take 1 tablet by mouth 2 (two) times daily for 7 days. 14 tablet Tamara Monteith A, NP     PDMP not reviewed this encounter.   Orvan July, NP 12/08/19 1117

## 2019-12-09 LAB — URINE CULTURE: Culture: 80000 — AB

## 2019-12-11 ENCOUNTER — Telehealth (HOSPITAL_COMMUNITY): Payer: Self-pay | Admitting: Emergency Medicine

## 2019-12-11 MED ORDER — NITROFURANTOIN MONOHYD MACRO 100 MG PO CAPS: 100.0000 mg | ORAL_CAPSULE | Freq: Two times a day (BID) | ORAL | 0 refills | Status: DC

## 2019-12-28 ENCOUNTER — Ambulatory Visit (HOSPITAL_COMMUNITY)
Admission: EM | Admit: 2019-12-28 | Discharge: 2019-12-28 | Disposition: A | Discharge status: Home / Self Care | Payer: Self-pay | Attending: Family Medicine | Admitting: Family Medicine

## 2019-12-28 ENCOUNTER — Other Ambulatory Visit: Payer: Self-pay

## 2019-12-28 ENCOUNTER — Encounter (HOSPITAL_COMMUNITY): Payer: Self-pay | Admitting: Emergency Medicine

## 2019-12-28 DIAGNOSIS — R3915 Urgency of urination: Secondary | ICD-10-CM

## 2019-12-28 DIAGNOSIS — N390 Urinary tract infection, site not specified: Secondary | ICD-10-CM

## 2019-12-28 LAB — POCT URINALYSIS DIPSTICK, ED / UC
Bilirubin Urine: NEGATIVE
Glucose, UA: NEGATIVE mg/dL
Hgb urine dipstick: NEGATIVE
Ketones, ur: NEGATIVE mg/dL
Nitrite: NEGATIVE
Protein, ur: NEGATIVE mg/dL
Specific Gravity, Urine: 1.025 (ref 1.005–1.030)
Urobilinogen, UA: 0.2 mg/dL (ref 0.0–1.0)
pH: 5.5 (ref 5.0–8.0)

## 2019-12-28 MED ORDER — NITROFURANTOIN MONOHYD MACRO 100 MG PO CAPS: 100.0000 mg | ORAL_CAPSULE | Freq: Two times a day (BID) | ORAL | 0 refills | Status: DC

## 2019-12-28 NOTE — Discharge Instructions (Signed)
We are sending the urine for culture.  Will call when we get the results and treat. If appropriate.  Recommend seeing a urology specialist.  I have referred you there.

## 2019-12-28 NOTE — ED Triage Notes (Signed)
Pt c/o urinary urgency x 2 days. He states he was just treated for a UTI but feels like it did not clear up all the way. He states he finished his treatment and felt better. Pt states he would also like to discuss preventative care and medication.

## 2019-12-28 NOTE — ED Provider Notes (Signed)
[place]    CSN: [id number] Arrival date & time: 12/28/19  0815      History   Chief Complaint Chief Complaint  Patient presents with  . Urinary Tract Infection    HPI Riley Simmons is a 24 y.o. male.   Patient is a 24 year old male with past medical history of scoliosis, spina bifida, chronic urinary tract infections.  He presents today with recurrent infection.  He has had urinary urgency for 2 days.  Was treated for urinary tract infection a couple weeks ago and did get improvement but never feels like the symptoms went away.  He still reusing catheters due to cost.  No fevers, chills, abdominal pain     Past Medical History:  Diagnosis Date  . Marijuana smoker, continuous   . Scoliosis   . Spina bifida ([place])   . Urethritis     Patient Active Problem List   Diagnosis Date Noted  . Spina bifida ([place]) 09/20/2017  . Neurogenic bladder 09/20/2017  . Leukocytosis   . Pain in right testicle   . Epididymitis 08/16/2017    Past Surgical History:  Procedure Laterality Date  . BACK SURGERY     as a baby due to spina bifida   . BLADDER SURGERY         Home Medications    Prior to Admission medications   Medication Sig Start Date End Date Taking? Authorizing Provider  benzonatate (TESSALON PERLES) 100 MG capsule Take 1 capsule (100 mg total) by mouth 3 (three) times daily as needed for cough. 09/29/19   Chevis Pretty, FNP  Catheters KIT Coloplast 12 french Provide 30 self catheterization kits for PRN catheterization 12/07/19   Loura Halt A, NP  nitrofurantoin, macrocrystal-monohydrate, (MACROBID) 100 MG capsule Take 1 capsule (100 mg total) by mouth 2 (two) times daily. 12/28/19   Orvan July, NP    Family History Family History  Problem Relation Age of Onset  . Deafness Mother     Social History Social History   Tobacco Use  . Smoking status: Current Some Day Smoker    Packs/day: 0.50    Types: Cigarettes  . Smokeless  tobacco: Never Used  Vaping Use  . Vaping Use: Never used  Substance Use Topics  . Alcohol use: Not Currently  . Drug use: Yes    Types: Marijuana    Comment: daily     Allergies   Levofloxacin, Amoxicillin, and Latex   Review of Systems Review of Systems   Physical Exam Triage Vital Signs ED Triage Vitals  Enc Vitals Group     BP 12/28/19 0846 136/89     Pulse Rate 12/28/19 0846 76     Resp 12/28/19 0846 17     Temp 12/28/19 0846 98.2 F (36.8 C)     Temp Source 12/28/19 0846 Oral     SpO2 12/28/19 0846 98 %     Weight --      Height --      Head Circumference --      Peak Flow --      Pain Score 12/28/19 0844 0     Pain Loc --      Pain Edu? --      Excl. in [place]? --    No data found.  Updated Vital Signs BP 136/89 (BP Location: Left Arm)   Pulse 76   Temp 98.2 F (36.8 C) (Oral)   Resp 17   SpO2 98%   Visual Acuity  Right Eye Distance:   Left Eye Distance:   Bilateral Distance:    Right Eye Near:   Left Eye Near:    Bilateral Near:     Physical Exam Vitals and nursing note reviewed.  Constitutional:      Appearance: Normal appearance.  HENT:     Head: Normocephalic and atraumatic.     Nose: Nose normal.  Eyes:     Conjunctiva/sclera: Conjunctivae normal.  Pulmonary:     Effort: Pulmonary effort is normal.  Musculoskeletal:        General: Normal range of motion.     Cervical back: Normal range of motion.  Skin:    General: Skin is warm and dry.  Neurological:     Mental Status: He is alert.  Psychiatric:        Mood and Affect: Mood normal.      UC Treatments / Results  Labs (all labs ordered are listed, but only abnormal results are displayed) Labs Reviewed  POCT URINALYSIS DIPSTICK, ED / UC - Abnormal; Notable for the following components:      Result Value   Leukocytes,Ua TRACE (*)    All other components within normal limits  URINE CULTURE    EKG   Radiology No results found.  Procedures Procedures (including  critical care time)  Medications Ordered in UC Medications - No data to display  Initial Impression / Assessment and Plan / UC Course  I have reviewed the triage vital signs and the nursing notes.  Pertinent labs & imaging results that were available during my care of the patient were reviewed by me and considered in my medical decision making (see chart for details).     Urinary urgency Urine with trace leuks.  Sending for culture. Patient requesting treatment with Macrobid at this time.  We will go ahead and prescribe Macrobid pending culture results. Referral placed for urology  Final Clinical Impressions(s) / UC Diagnoses   Final diagnoses:  Urinary urgency     Discharge Instructions     We are sending the urine for culture.  Will call when we get the results and treat. If appropriate.  Recommend seeing a urology specialist.  I have referred you there.     ED Prescriptions    Medication Sig Dispense Auth. Provider   nitrofurantoin, macrocrystal-monohydrate, (MACROBID) 100 MG capsule Take 1 capsule (100 mg total) by mouth 2 (two) times daily. 10 capsule Loura Halt A, NP     PDMP not reviewed this encounter.   Loura Halt A, NP 12/28/19 1440

## 2019-12-30 LAB — URINE CULTURE: Culture: 30000 — AB

## 2020-01-02 ENCOUNTER — Ambulatory Visit (INDEPENDENT_AMBULATORY_CARE_PROVIDER_SITE_OTHER): Payer: Self-pay | Admitting: Urology

## 2020-01-02 ENCOUNTER — Encounter: Payer: Self-pay | Admitting: Urology

## 2020-01-02 ENCOUNTER — Other Ambulatory Visit: Payer: Self-pay

## 2020-01-02 VITALS — BP 131/88 | HR 76 | Ht 65.0 in | Wt 170.0 lb

## 2020-01-02 DIAGNOSIS — N319 Neuromuscular dysfunction of bladder, unspecified: Secondary | ICD-10-CM

## 2020-01-02 DIAGNOSIS — N390 Urinary tract infection, site not specified: Secondary | ICD-10-CM

## 2020-01-02 LAB — URINALYSIS, COMPLETE
Bilirubin, UA: NEGATIVE
Glucose, UA: NEGATIVE
Ketones, UA: NEGATIVE
Nitrite, UA: NEGATIVE
Protein,UA: NEGATIVE
RBC, UA: NEGATIVE
Specific Gravity, UA: 1.025 (ref 1.005–1.030)
Urobilinogen, Ur: 0.2 mg/dL (ref 0.2–1.0)
pH, UA: 6.5 (ref 5.0–7.5)

## 2020-01-02 LAB — MICROSCOPIC EXAMINATION: Bacteria, UA: NONE SEEN

## 2020-01-02 MED ORDER — OXYBUTYNIN CHLORIDE ER 10 MG PO TB24: 10.0000 mg | ORAL_TABLET | Freq: Every day | ORAL | 11 refills | Status: DC

## 2020-01-02 MED ORDER — NITROFURANTOIN MONOHYD MACRO 100 MG PO CAPS
100.0000 mg | ORAL_CAPSULE | Freq: Two times a day (BID) | ORAL | 0 refills | Status: DC
Start: 2020-01-02 — End: 2020-03-21

## 2020-01-02 NOTE — Progress Notes (Signed)
01/02/2020 2:56 PM   Maryjo Rochester Aura Camps 1995/07/20 [id number]  Referring provider: Orvan July, NP [address] [place],  [place] [address]  Chief Complaint  Patient presents with  . Urinary Frequency    HPI: Riley Simmons is a 24 y.o. male who presents to establish local urologic care.   Spina bifida with neurogenic bladder  Catheterizes every 3 hours though states recently due to increased frequency of UTIs he is cathing every 1-2 hours with moderate urine volumes obtained  Seen [facility] 12/28/2019 and treated for UTI with Macrobid  Urine culture grew t 30,000 colonies Enterococcus  Was also treated in late October for same  Typical UTI symptoms include frequency, urgency, nocturnal enuresis and suprapubic pain  History of febrile UTIs but none recently  Does not void between caths though has occasional episodes of incontinence use related to increased activity  Last urologic follow-up 3-4 years ago  CT 03/2019 showed no hydronephrosis; nonobstructing 6 mm parenchymal calcification versus calculus; chronic left renal atrophy/scarring  PMH: Past Medical History:  Diagnosis Date  . Marijuana smoker, continuous   . Scoliosis   . Spina bifida ([place])   . Urethritis     Surgical History: Past Surgical History:  Procedure Laterality Date  . BACK SURGERY     as a baby due to spina bifida   . BLADDER SURGERY      Home Medications:  Allergies as of 01/02/2020      Reactions   Levofloxacin Anxiety, Hives, Swelling   Amoxicillin Hives, Rash   Did it involve swelling of the face/tongue/throat, SOB, or low BP? Unknown Did it involve sudden or severe rash/hives, skin peeling, or any reaction on the inside of your mouth or nose? Yes Did you need to seek medical attention at a hospital or doctor's office? Unknown When did it last happen?unk If all above answers are "NO", may proceed with cephalosporin use.   Latex Rash      Medication List         Accurate as of January 02, 2020  2:56 PM. If you have any questions, ask your nurse or doctor.        benzonatate 100 MG capsule Commonly known as: Tessalon Perles Take 1 capsule (100 mg total) by mouth 3 (three) times daily as needed for cough.   Catheters Kit Coloplast 12 french Provide 30 self catheterization kits for PRN catheterization   nitrofurantoin (macrocrystal-monohydrate) 100 MG capsule Commonly known as: MACROBID Take 1 capsule (100 mg total) by mouth 2 (two) times daily.       Allergies:  Allergies  Allergen Reactions  . Levofloxacin Anxiety, Hives and Swelling  . Amoxicillin Hives and Rash    Did it involve swelling of the face/tongue/throat, SOB, or low BP? Unknown Did it involve sudden or severe rash/hives, skin peeling, or any reaction on the inside of your mouth or nose? Yes Did you need to seek medical attention at a hospital or doctor's office? Unknown When did it last happen?unk If all above answers are "NO", may proceed with cephalosporin use.   . Latex Rash    Family History: Family History  Problem Relation Age of Onset  . Deafness Mother     Social History:  reports that he has been smoking cigarettes. He has been smoking about 0.50 packs per day. He has never used smokeless tobacco. He reports previous alcohol use. He reports current drug use. Drug: Marijuana.   Physical Exam: BP 131/88  Pulse 76   Ht '5\' 5"'  (1.651 m)   Wt 170 lb (77.1 kg)   BMI 28.29 kg/m   Constitutional:  Alert and oriented, No acute distress. HEENT: [place] AT, moist mucus membranes.  Trachea midline, no masses. Cardiovascular: No clubbing, cyanosis, or edema. Respiratory: Normal respiratory effort, no increased work of breathing. Psychiatric: Normal mood and affect.    Assessment & Plan:    1.  Neurogenic bladder secondary to spina bifida  Recommend getting back to his regular catheter interval  Recent upper tract imaging showed no hydronephrosis  He  did request to get back on oxybutynin which he has been on the past and Rx sent  2.  Recurrent UTI  UA today is clear  States he just started feeling better yesterday and has 2 capsules left.  Will refill antibiotic x1 week  We discussed starting D-mannose for potential UTI prevention  Recommend annual follow-up and prn visits for recurrent infections   Abbie Sons, MD  [facility] [address] [place], [place] [address] [phone]

## 2020-02-05 ENCOUNTER — Telehealth: Payer: Self-pay

## 2020-02-05 NOTE — Telephone Encounter (Signed)
Incoming call from pt on triage line who states that he believes oxybutynin is causing him to have diarrhea and nausea. He would like to know what other alternatives are available. Please advise.

## 2020-02-06 IMAGING — CT CT RENAL STONE PROTOCOL
2 of 5 series · 16 of 46 positions shown, 18 images · non-contrast
Comparison: 08/16/2017

CLINICAL DATA: Left flank pain, lower abdominal discomfort and
increased urinary frequency.

EXAM:
CT ABDOMEN AND PELVIS WITHOUT CONTRAST
TECHNIQUE: Multidetector CT imaging of the abdomen and pelvis was performed
following the standard protocol without IV contrast.

[Series 4: thins · axial · 0.90mm/px · z∈[+518,+955]mm · 13 of 677 slices shown, 15 images]
[im 27/677  soft-tissue]
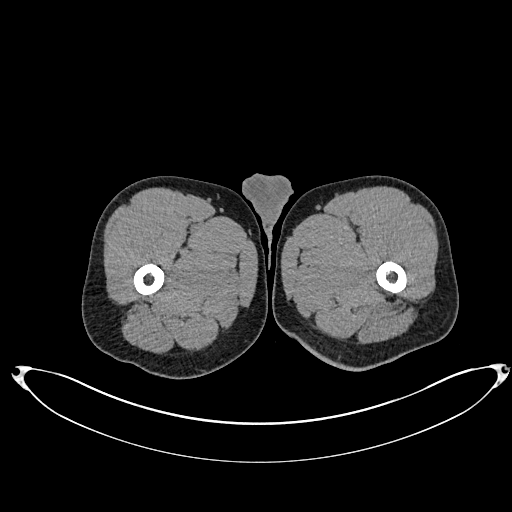
[im 27/677  bone]
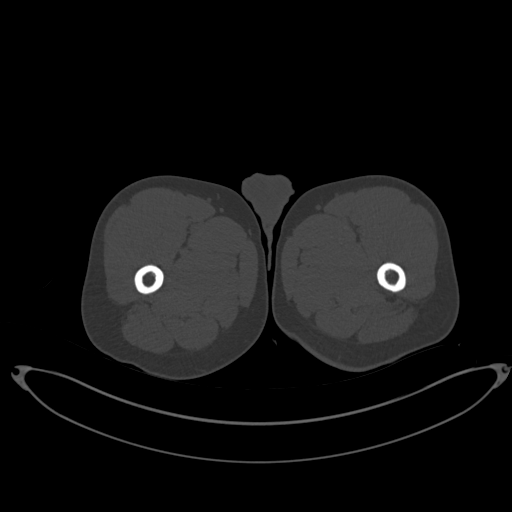
[im 79/677  soft-tissue]
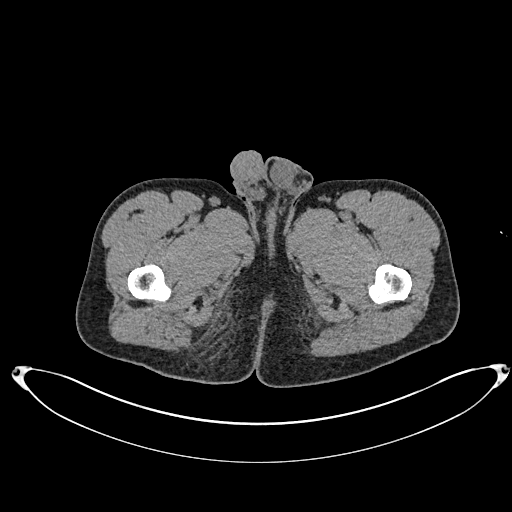
[im 131/677  soft-tissue]
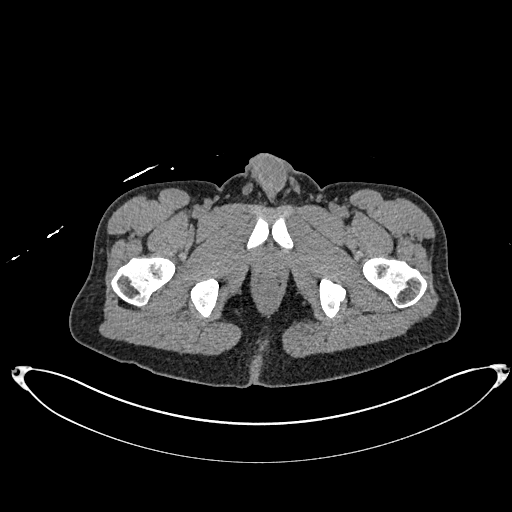
[im 183/677  soft-tissue]
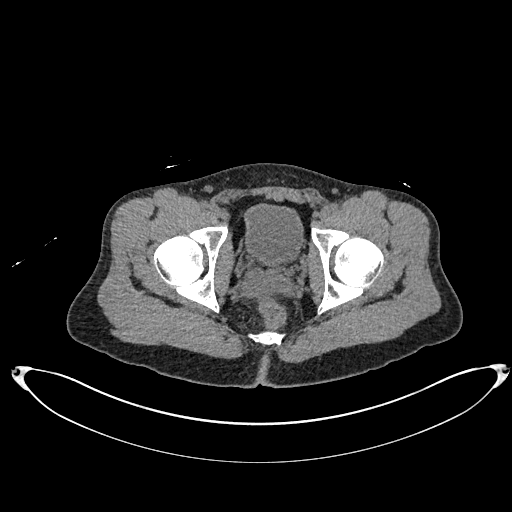
[im 235/677  soft-tissue]
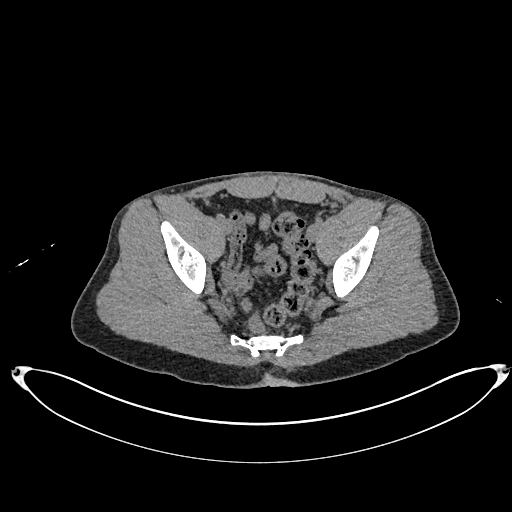
[im 287/677  soft-tissue]
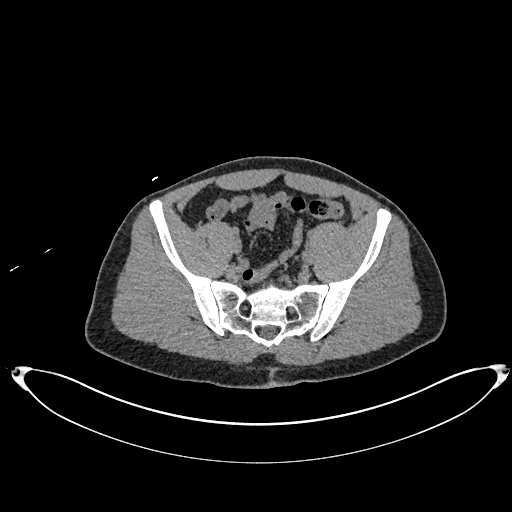
[im 339/677  soft-tissue]
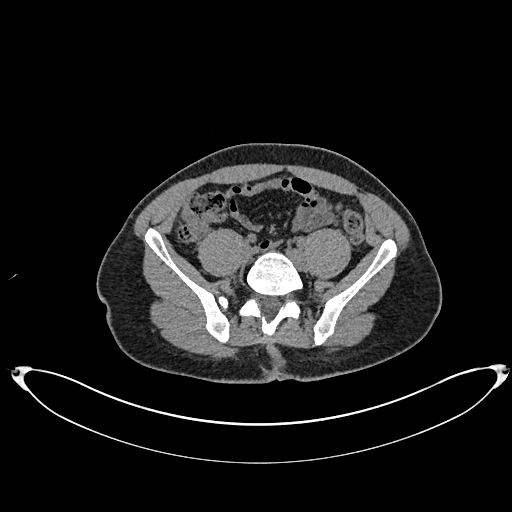
[im 391/677  soft-tissue]
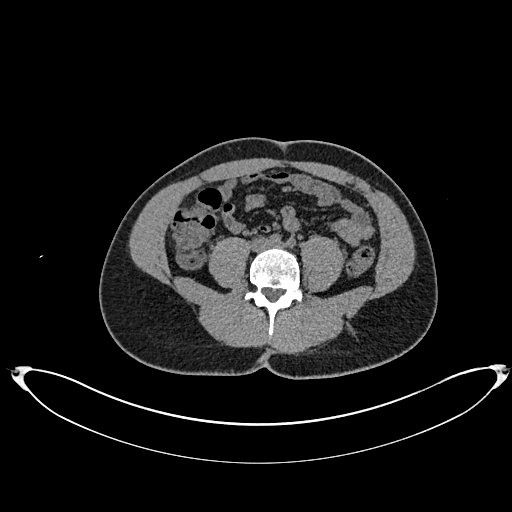
[im 443/677  soft-tissue]
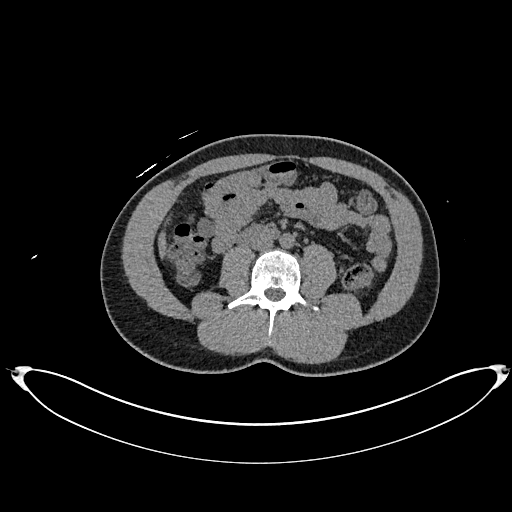
[im 443/677  bone]
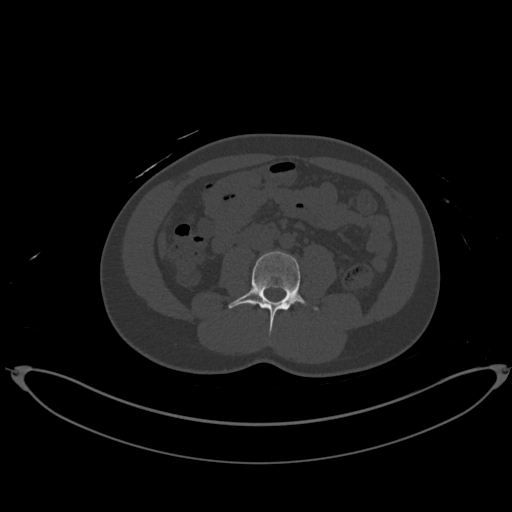
[im 495/677  soft-tissue]
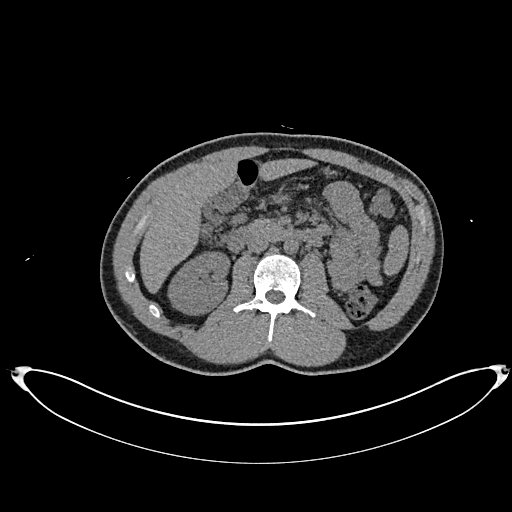
[im 547/677  soft-tissue]
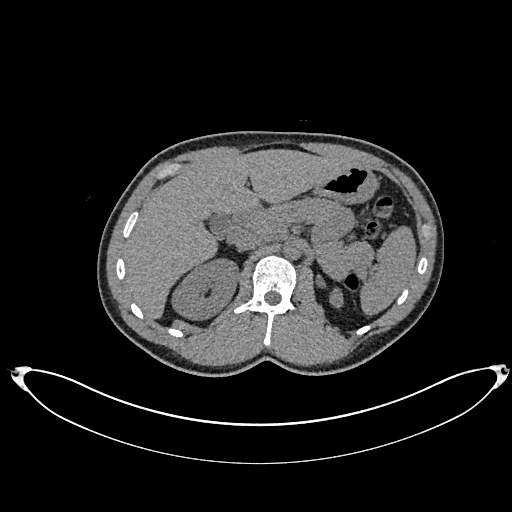
[im 599/677  soft-tissue]
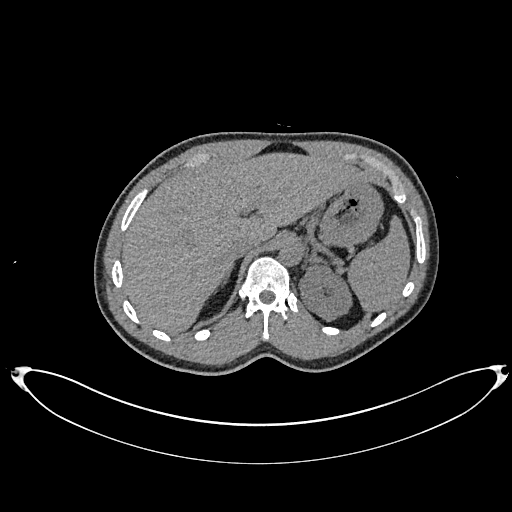
[im 651/677  soft-tissue]
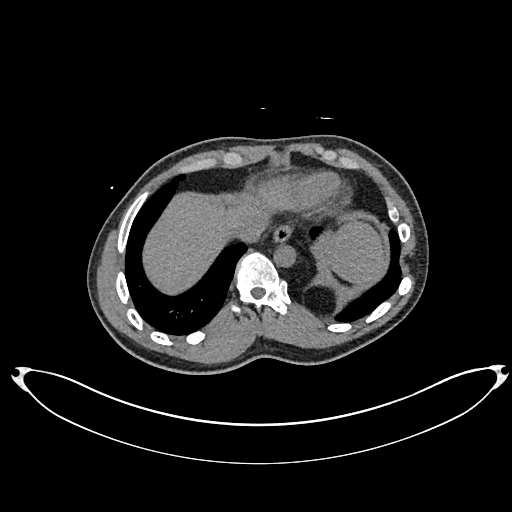

[Series 6: coronal soft tissue · coronal · 0.79mm/px · 3 of 96 slices shown]
[im 32/96  soft-tissue]
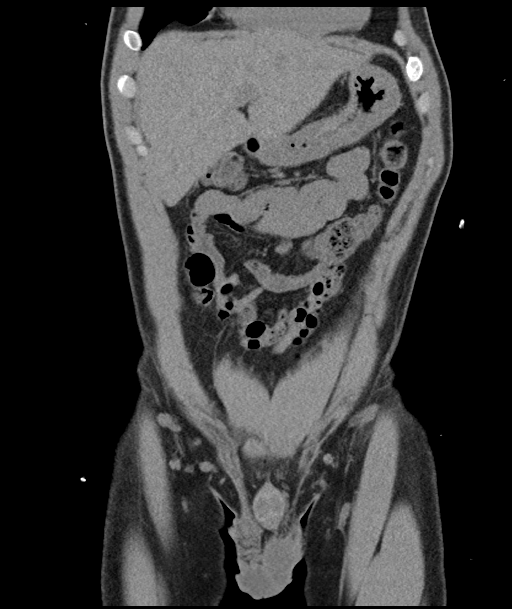
[im 43/96  soft-tissue]
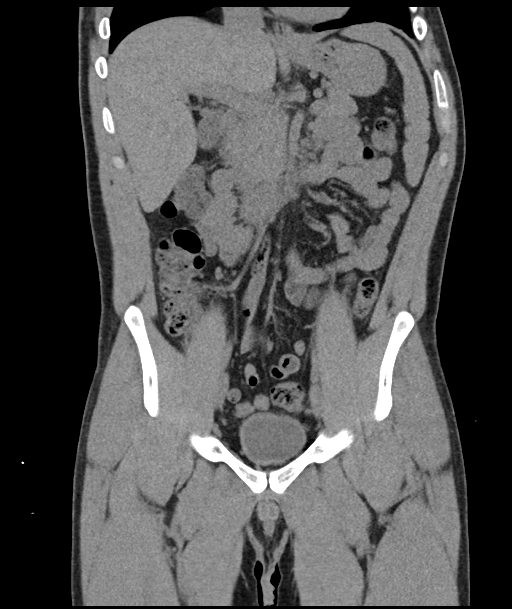
[im 53/96  soft-tissue]
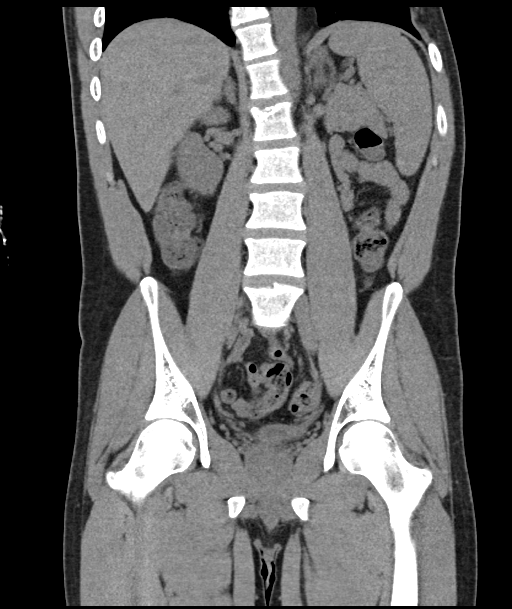

[16 of 46 positions shown; findings below may reference images not displayed]

FINDINGS: Lower chest: No acute abnormality.

Hepatobiliary: No focal liver abnormality is seen. No gallstones,
gallbladder wall thickening, or biliary dilatation.

Pancreas: Unremarkable. No pancreatic ductal dilatation or
surrounding inflammatory changes.

Spleen: Normal in size without focal abnormality.

Adrenals/Urinary Tract: Stable scarring and atrophy of the left
kidney with small focal parenchymal calcification in the lower pole.
No evidence of hydronephrosis or urinary tract calculi bilaterally.
The bladder is decompressed and unremarkable in appearance. Adrenal
glands are unremarkable.

Stomach/Bowel: Bowel shows no evidence of obstruction, ileus or
inflammation. No free air identified.

Vascular/Lymphatic: No significant vascular findings are present. No
enlarged abdominal or pelvic lymph nodes.

Reproductive: Prostate is unremarkable.

Other: No abdominal wall hernia or abnormality. No abdominopelvic
ascites.

Musculoskeletal: No acute or significant osseous findings.
IMPRESSION: Stable scarring and atrophy of the left kidney. No acute findings in
the abdomen or pelvis.

## 2020-02-06 NOTE — Telephone Encounter (Signed)
That class of medication typically causes constipation and diarrhea.  There are several medications in that class but all have similar side effects.  If you would like can give a trial of a second anticholinergic medication

## 2020-02-07 ENCOUNTER — Encounter: Payer: Self-pay | Admitting: *Deleted

## 2020-02-07 NOTE — Telephone Encounter (Signed)
No answer, sent my chart message.

## 2020-03-07 IMAGING — CR DG CHEST 2V
2 series · 2 of 2 positions shown · non-contrast
Comparison: 07/22/2016

CLINICAL DATA: Cough, congestion, wheezing, smoker, scoliosis and
spina bifida

EXAM:
CHEST - 2 VIEW

[w chest pa]
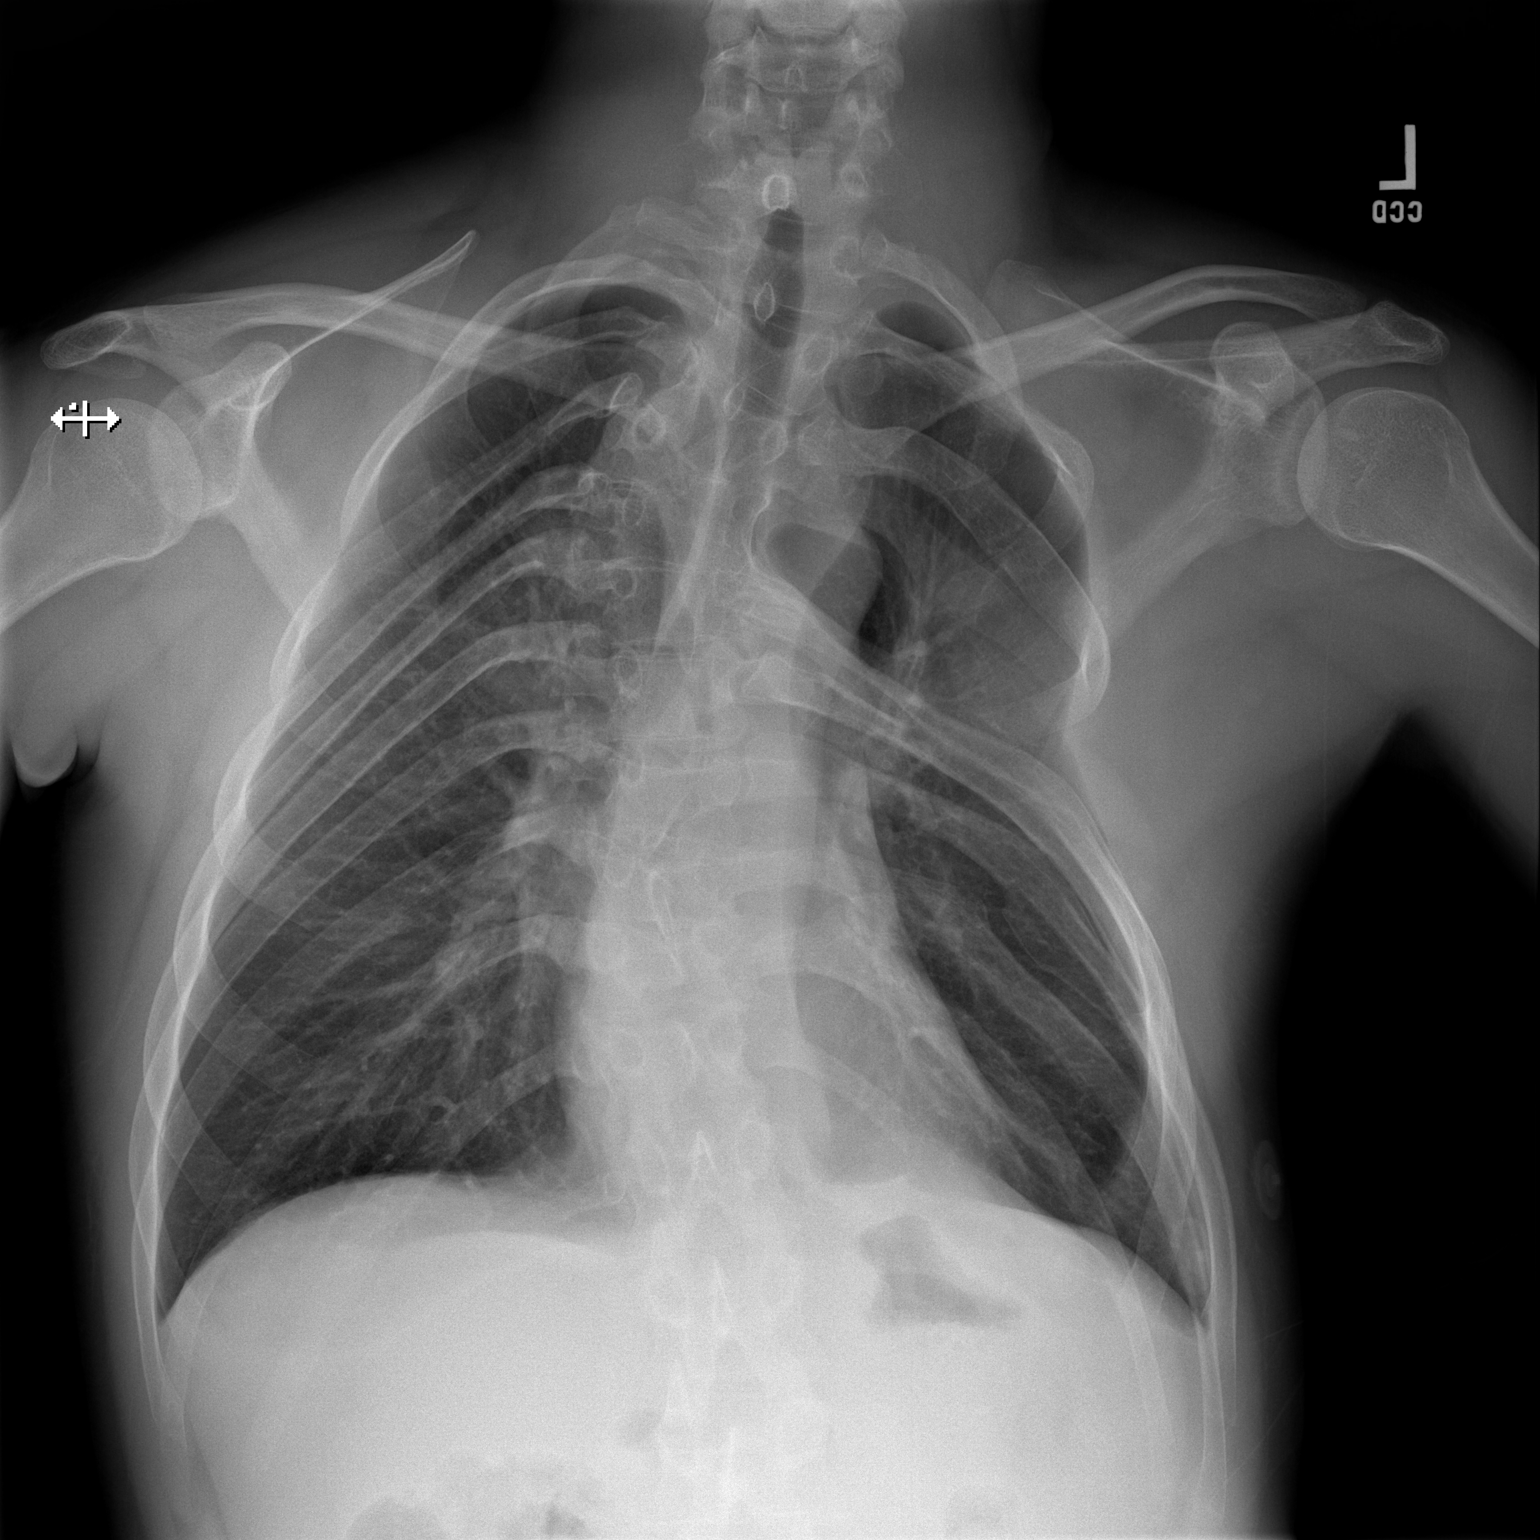

[w chest lat]
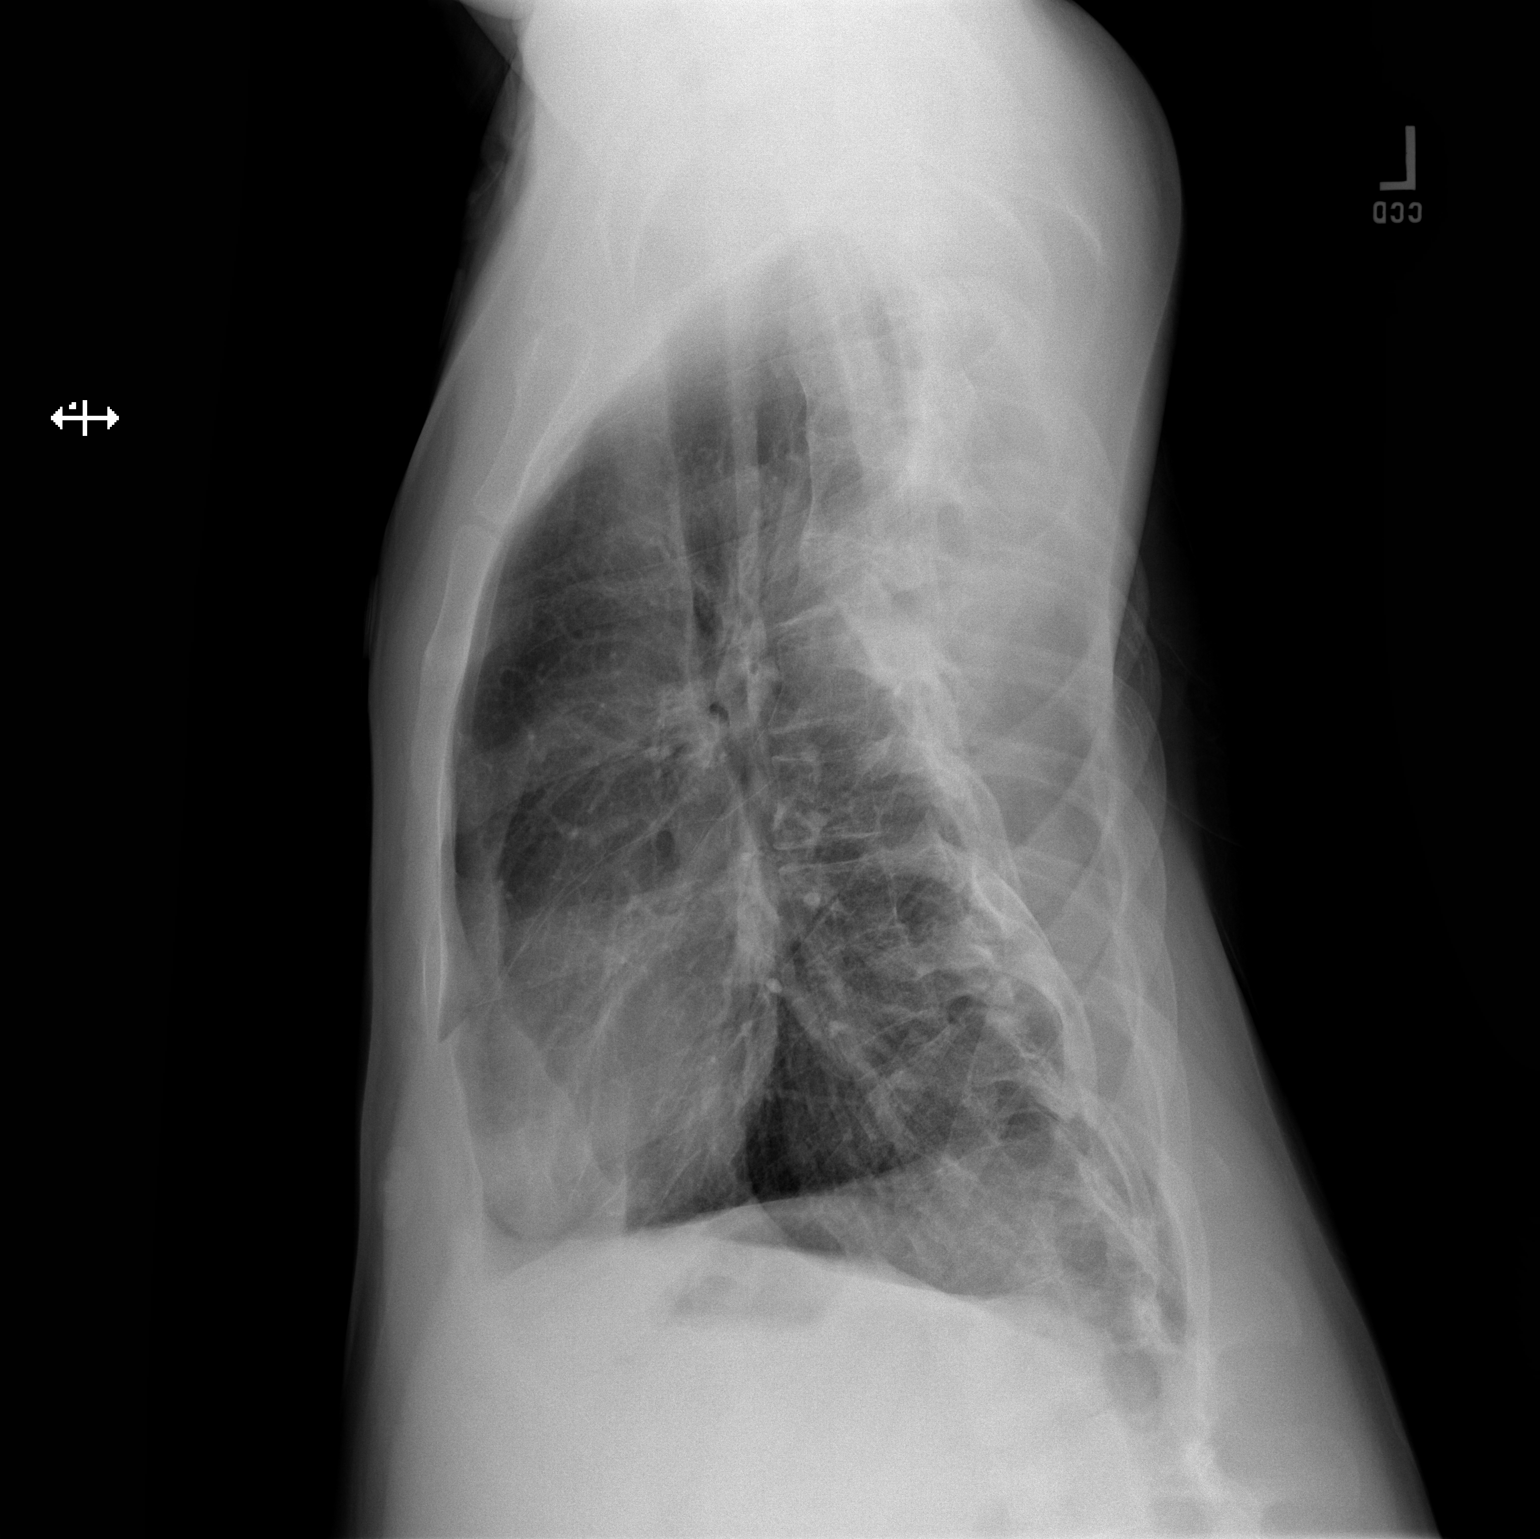

[2 of 2 positions shown; findings below may reference images not displayed]

FINDINGS: Normal heart size, mediastinal contours, and pulmonary vascularity.

Lungs clear.

No infiltrate, pleural effusion or pneumothorax.

Dextroconvex thoracic scoliosis and BILATERAL thoracic deformities
again noted.
IMPRESSION: No acute abnormalities.

## 2020-03-21 ENCOUNTER — Other Ambulatory Visit: Payer: Self-pay

## 2020-03-21 ENCOUNTER — Ambulatory Visit
Admission: RE | Admit: 2020-03-21 | Discharge: 2020-03-21 | Disposition: A | Discharge status: Home / Self Care | Payer: Self-pay | Source: Ambulatory Visit | Attending: Urology | Admitting: Urology

## 2020-03-21 ENCOUNTER — Ambulatory Visit: Payer: Self-pay | Admitting: Physician Assistant

## 2020-03-21 ENCOUNTER — Ambulatory Visit (INDEPENDENT_AMBULATORY_CARE_PROVIDER_SITE_OTHER): Payer: Self-pay | Admitting: Urology

## 2020-03-21 VITALS — BP 130/93 | HR 61 | Ht 65.0 in | Wt 160.0 lb

## 2020-03-21 DIAGNOSIS — N319 Neuromuscular dysfunction of bladder, unspecified: Secondary | ICD-10-CM

## 2020-03-21 DIAGNOSIS — R109 Unspecified abdominal pain: Secondary | ICD-10-CM | POA: Insufficient documentation

## 2020-03-21 DIAGNOSIS — N390 Urinary tract infection, site not specified: Secondary | ICD-10-CM

## 2020-03-21 DIAGNOSIS — R3915 Urgency of urination: Secondary | ICD-10-CM

## 2020-03-21 LAB — URINALYSIS, COMPLETE
Bilirubin, UA: NEGATIVE
Glucose, UA: NEGATIVE
Ketones, UA: NEGATIVE
Nitrite, UA: NEGATIVE
Protein,UA: NEGATIVE
RBC, UA: NEGATIVE
Specific Gravity, UA: 1.025 (ref 1.005–1.030)
Urobilinogen, Ur: 0.2 mg/dL (ref 0.2–1.0)
pH, UA: 5.5 (ref 5.0–7.5)

## 2020-03-21 LAB — MICROSCOPIC EXAMINATION: RBC, Urine: NONE SEEN /hpf (ref 0–2)

## 2020-03-21 LAB — BLADDER SCAN AMB NON-IMAGING: Scan Result: 69

## 2020-03-21 NOTE — Progress Notes (Signed)
03/21/2020 11:11 AM   Riley Simmons 1995-11-02 [id number]  Referring provider: Lanae Boast, [place],   [address]  Chief Complaint  Patient presents with  . Cystitis   Urological history: 1. rUTI's - contributing factors of neurogenic bladder, CIC - documented UTI's  On December 28, 2019 + for Enterococcus faecalis pan sensitive treated with Macrobid 100 mg, twice daily for 5 days  On December 07, 2019 + for Enterococcus faecalis pan sensitive treated with Bactrim DS, twice daily for 7 days  On August 11, 2019 + for Enterococcus faecalis pan sensitive treated with Bactrim DS, twice daily for 7 days  On May 04, 2019 + for Entercoccus faecalis pan sensitive treated with Bactrim DS, twice daily for 7 days  2. Nephrolithiasis - CT renal stone study 04/01/2019 nonobstructing stone versus parenchymal calcification in the lower left kidney measuring 6 mm  HPI: Riley Simmons is a 25 y.o. male who presents today for cystitis.    He states a few days ago he was having intercourse and he experienced a pain in his left lower back and left abdomen.  He has been having some difficulty with catheterization as he feels like there is something stuck in his urethra causing intermittent pain.  He is self catheterizing up to 7 times a day.    His UA 6-10 WBC's.    PVR is 69 mL.  KUB 03/21/2020 calculus in bladder.     PMH: Past Medical History:  Diagnosis Date  . Marijuana smoker, continuous   . Scoliosis   . Spina bifida ([place])   . Urethritis     Surgical History: Past Surgical History:  Procedure Laterality Date  . BACK SURGERY     as a baby due to spina bifida   . BLADDER SURGERY      Home Medications:  Allergies as of 03/21/2020      Reactions   Levofloxacin Anxiety, Hives, Swelling   Amoxicillin Hives, Rash   Did it involve swelling of the face/tongue/throat, SOB, or low BP? Unknown Did it involve sudden or  severe rash/hives, skin peeling, or any reaction on the inside of your mouth or nose? Yes Did you need to seek medical attention at a hospital or doctor's office? Unknown When did it last happen?unk If all above answers are "NO", may proceed with cephalosporin use.   Latex Rash      Medication List       Accurate as of March 21, 2020 11:59 PM. If you have any questions, ask your nurse or doctor.        STOP taking these medications   benzonatate 100 MG capsule Commonly known as: Best boy Stopped by: Zara Council, PA-C   Catheters Kit Stopped by: Zara Council, PA-C   nitrofurantoin (macrocrystal-monohydrate) 100 MG capsule Commonly known as: MACROBID Stopped by: Zara Council, PA-C     TAKE these medications   oxybutynin 10 MG 24 hr tablet Commonly known as: DITROPAN-XL Take 1 tablet (10 mg total) by mouth daily.       Allergies:  Allergies  Allergen Reactions  . Levofloxacin Anxiety, Hives and Swelling  . Amoxicillin Hives and Rash    Did it involve swelling of the face/tongue/throat, SOB, or low BP? Unknown Did it involve sudden or severe rash/hives, skin peeling, or any reaction on the inside of your mouth or nose? Yes Did you need to seek medical attention at a hospital or doctor's office?  Unknown When did it last happen?unk If all above answers are "NO", may proceed with cephalosporin use.   . Latex Rash    Family History: Family History  Problem Relation Age of Onset  . Deafness Mother     Social History:  reports that he has been smoking cigarettes. He has been smoking about 0.50 packs per day. He has never used smokeless tobacco. He reports previous alcohol use. He reports current drug use. Drug: Marijuana.  ROS: Pertinent ROS in HPI  Physical Exam: BP (!) 130/93   Pulse 61   Ht '5\' 5"'  (1.651 m)   Wt 160 lb (72.6 kg)   BMI 26.63 kg/m   Constitutional:  Well nourished. Alert and oriented, No acute distress. HEENT:  [place] AT, mask in place.  Trachea midline Cardiovascular: No clubbing, cyanosis, or edema. Respiratory: Normal respiratory effort, no increased work of breathing. Neurologic: Grossly intact, no focal deficits, moving all 4 extremities. Psychiatric: Normal mood and affect.  Laboratory Data: Lab Results  Component Value Date   WBC 15.9 (H) 07/24/2019   HGB 15.9 07/24/2019   HCT 47.9 07/24/2019   MCV 94.5 07/24/2019   PLT 304 07/24/2019    Lab Results  Component Value Date   CREATININE 0.98 07/24/2019   Lab Results  Component Value Date   TSH 3.923 12/10/2010   Urinalysis Component     Latest Ref Rng & Units 03/21/2020  Specific Gravity, UA     1.005 - 1.030 1.025  pH, UA     5.0 - 7.5 5.5  Color, UA     Yellow Yellow  Appearance Ur     Clear Hazy (A)  Leukocytes,UA     Negative Trace (A)  Protein,UA     Negative/Trace Negative  Glucose, UA     Negative Negative  Ketones, UA     Negative Negative  RBC, UA     Negative Negative  Bilirubin, UA     Negative Negative  Urobilinogen, Ur     0.2 - 1.0 mg/dL 0.2  Nitrite, UA     Negative Negative  Microscopic Examination      See below:   Component     Latest Ref Rng & Units 03/21/2020  WBC, UA     0 - 5 /hpf 6-10 (A)  RBC     0 - 2 /hpf None seen  Epithelial Cells (non renal)     0 - 10 /hpf 0-10  Casts     None seen /lpf Present (A)  Cast Type     N/A Granular casts (A)  Bacteria, UA     None seen/Few Few  I have reviewed the labs.   Pertinent Imaging: CLINICAL DATA:  Flank pain.  EXAM: ABDOMEN - 1 VIEW  COMPARISON:  There a 13 2013  FINDINGS: The previously demonstrated left-sided kidney stone is not well appreciated. There is an apparent new rounded calcification projecting over the patient's pelvis. This could represent a distal ureteral stone in the appropriate clinical setting. The bowel gas pattern is nonobstructive.  IMPRESSION: 1. Nonobstructive bowel gas pattern. 2. New rounded  calcification projecting over the patient's pelvis could represent a distal ureteral stone in the appropriate clinical setting. 3. The previously demonstrated left-sided ureteral stone is not well visualized on this study.   Electronically Signed   By: Constance Holster M.D.   On: 03/21/2020 23:52 I have independently reviewed the films.  See HPI.   Assessment & Plan:    1. Flank  pain - DG Abd 1 View; Future - Urinalysis, Complete - CULTURE, URINE COMPREHENSIVE - BLADDER SCAN AMB NON-IMAGING - a new calcification is seen in his pelvis area that may represent a new bladder calculi or the migration of the left lower renal pole stone -I recommended that patient undergo cystoscopy for further evaluation, but he deferred at this time -We discussed that a bladder stone would contribute to recurrent UTIs and discomfort and possibly sepsis so it is important to get his cystoscopy completed -He would like to obtain insurance prior to scheduling more tests which should be in about 30 days  2. rUTI's -Patient seeks treatment in the ED for his UTIs as he has no insurance -He is reusing catheters due to expense which increases his risk for UTIs -If he does have a bladder calculi, this can also contribute to urinary urgency and recurrent UTIs -Cystoscopy is pending per acquiring insurance per patient -We were able to give patient samples of 12 [place] Coloplast catheters today in the office and instructed him to contact us for further samples in the future  3. Urgency -Differential diagnosis consists of neurogenic bladder versus recurrent UTIs versus bladder calculi -cystoscopy pending  4. Neurogenic bladder -Continue self-catheterization with clean catheters each time   Return for pending insurance .  These notes generated with voice recognition software. I apologize for typographical errors.  Zara Council, PA-C  [facility] [address]  [place], [place] [address] [phone]

## 2020-03-22 ENCOUNTER — Ambulatory Visit: Payer: Self-pay | Admitting: Physician Assistant

## 2020-03-25 ENCOUNTER — Telehealth: Payer: Self-pay | Admitting: Family Medicine

## 2020-03-25 LAB — CULTURE, URINE COMPREHENSIVE

## 2020-03-25 MED ORDER — CIPROFLOXACIN HCL 500 MG PO TABS: 500.0000 mg | ORAL_TABLET | Freq: Two times a day (BID) | ORAL | 0 refills | Status: DC

## 2020-03-25 NOTE — Telephone Encounter (Signed)
LMOM informed patient of positive urine culture and I have sent ABX to [facility] pharmacy in [facility].

## 2020-03-25 NOTE — Telephone Encounter (Signed)
Patient notified, verbalized understanding

## 2020-03-25 NOTE — Telephone Encounter (Signed)
-----   Message from Harle Battiest, PA-C sent at 03/25/2020 11:50 AM EST ----- Please let Mr. Bledsoe know that his urine culture is positive for infection and he needs to start Cipro 500 mg, twice daily for seven days.

## 2020-03-25 NOTE — Telephone Encounter (Signed)
LMOM for patient to return call. Want to confirm pharmacy to send ABX.

## 2020-04-04 ENCOUNTER — Encounter: Payer: Self-pay | Admitting: Urology

## 2020-05-15 ENCOUNTER — Telehealth: Payer: Self-pay | Admitting: Nurse Practitioner

## 2020-05-15 DIAGNOSIS — H60332 Swimmer's ear, left ear: Secondary | ICD-10-CM

## 2020-05-15 MED ORDER — NEOMYCIN-POLYMYXIN-HC 3.5-10000-1 OP SUSP: 3.0000 [drp] | Freq: Four times a day (QID) | OPHTHALMIC | 0 refills | Status: DC

## 2020-05-15 MED ORDER — NEOMYCIN-POLYMYXIN-HC 3.5-10000-1 OT SOLN: 3.0000 [drp] | Freq: Four times a day (QID) | OTIC | 0 refills | Status: DC

## 2020-05-15 NOTE — Progress Notes (Signed)
E Visit for Swimmer's Ear  We are sorry that you are not feeling well. Here is how we plan to help!  I have prescribed: Neomycin 0.35%, polymyxin B 10,000 units/mL, and hydrocortisone 0,5% otic solution 4 drops in affected ears four times a day for 7 days    In certain cases swimmer's ear may progress to a more serious bacterial infection of the middle or inner ear.  If you have a fever 102 and up and significantly worsening symptoms, this could indicate a more serious infection moving to the middle/inner and needs face to face evaluation in an office by a provider.  Your symptoms should improve over the next 3 days and should resolve in about 7 days.  HOME CARE:   Wash your hands frequently.  Do not place the tip of the bottle on your ear or touch it with your fingers.  You can take Acetominophen 650 mg every 4-6 hours as needed for pain.  If pain is severe or moderate, you can apply a heating pad (set on low) or hot water bottle (wrapped in a towel) to outer ear for 20 minutes.  This will also increase drainage.  Avoid ear plugs  Do not use Q-tips  After showers, help the water run out by tilting your head to one side.  GET HELP RIGHT AWAY IF:   Fever is over 102.2 degrees.  You develop progressive ear pain or hearing loss.  Ear symptoms persist longer than 3 days after treatment.  MAKE SURE YOU:   Understand these instructions.  Will watch your condition.  Will get help right away if you are not doing well or get worse.  TO PREVENT SWIMMER'S EAR:  Use a bathing cap or custom fitted swim molds to keep your ears dry.  Towel off after swimming to dry your ears.  Tilt your head or pull your earlobes to allow the water to escape your ear canal.  If there is still water in your ears, consider using a hairdryer on the lowest setting.  Thank you for choosing an e-visit. Your e-visit answers were reviewed by a board certified advanced clinical practitioner to  complete your personal care plan. Depending upon the condition, your plan could have included both over the counter or prescription medications. Please review your pharmacy choice. Be sure that the pharmacy you have chosen is open so that you can pick up your prescription now.  If there is a problem you may message your provider in MyChart to have the prescription routed to another pharmacy. Your safety is important to us. If you have drug allergies check your prescription carefully.  For the next 24 hours, you can use MyChart to ask questions about today's visit, request a non-urgent call back, or ask for a work or school excuse from your e-visit provider. You will get an email in the next two days asking about your experience. I hope that your e-visit has been valuable and will speed your recovery.   5-10 minutes spent reviewing and documenting in chart.     

## 2020-05-15 NOTE — Addendum Note (Signed)
Addended by: Bennie Pierini on: 05/15/2020 05:05 PM   Modules accepted: Orders

## 2020-05-15 NOTE — Progress Notes (Signed)
Prescription sent in wrong- corrected as below. Meds ordered this encounter  Medications  . DISCONTD: neomycin-polymyxin-hydrocortisone (CORTISPORIN) 3.5-10000-1 ophthalmic suspension    Sig: Place 3 drops into the left eye 4 (four) times daily.    Dispense:  7.5 mL    Refill:  0    Order Specific Question:   Supervising Provider    Answer:   MILLER, BRIAN [[id number]]  . neomycin-polymyxin-hydrocortisone (CORTISPORIN) OTIC solution    Sig: Place 3 drops into the left ear 4 (four) times daily.    Dispense:  10 mL    Refill:  0    Order Specific Question:   Supervising Provider    Answer:   Eber Hong [[id number]]

## 2020-06-15 ENCOUNTER — Telehealth: Payer: Self-pay | Admitting: Physician Assistant

## 2020-06-15 ENCOUNTER — Encounter: Payer: Self-pay | Admitting: Family Medicine

## 2020-06-15 DIAGNOSIS — N390 Urinary tract infection, site not specified: Secondary | ICD-10-CM

## 2020-06-15 NOTE — Progress Notes (Signed)
Based on what you shared with me, I feel your condition warrants further evaluation and I recommend that you be seen for a face to face office visit.  Male bladder infections are not very common.  We worry about prostate or kidney conditions.  The standard of care is to examine the abdomen and kidneys, and to do a urine and blood test to make sure that something more serious is not going on.  We recommend that you see a provider today.  If your doctor's office is closed [facility] has the following Urgent Cares:    NOTE: If you entered your credit card information for this eVisit, you will not be charged. You may see a "hold" on your card for the $35 but that hold will drop off and you will not have a charge processed.   If you are having a true medical emergency please call 911.     For an urgent face to face visit, [facility] has five urgent care centers for your convenience:     [facility] [place] Get Driving Directions [phone] [address] [place], [place] [address] . 10 am - 6pm Monday - Friday    [facility] Get Driving Directions [phone] [address] [place], [place] [address] . 10 am to 8 pm Monday-Friday . 12 pm to 8 pm Saturday-Sunday  [facility] [facility] [place]) Get Driving Directions [phone]  [address] ,  [place]  [address] . 8 am to 8 pm Monday-Friday . 8 am to 4 pm Saturday-Sunday  [facility] Get Driving Directions [phone] [address] [place], [place] [address] . 8 am to 8 pm Monday-Friday . 9 am to 6 pm Saturday . 11 am to 6 pm Sunday   [facility] [place] Get Driving Directions  [phone] [address] [place], [place] [address] . 8 am to 8 pm Monday-Friday . 8 am to 4 pm Saturday-Sunday   [facility] [place] Get Driving Directions [phone] [address] [place], [place] [address] . 12 pm to 6 pm Monday-Friday    VIDEO VISITS: [facility] is now providing on-demand video visits for your convenience. Speak to one of our providers from the comfort of your home 8 am to 8 pm or after hours through our partner vendor.   For Video Visit details and options:   [email or web address]      Your MyChart E-visit questionnaire answers were reviewed by a board certified advanced clinical practitioner to complete your personal care plan based on your specific symptoms.  Thank you for using e-Visits.       

## 2020-11-21 ENCOUNTER — Telehealth: Payer: Self-pay | Admitting: Family Medicine

## 2020-11-21 NOTE — Telephone Encounter (Signed)
He saw Carollee Herter in February 2022 and was found to have a bladder calculus.  He needs an in person visit with KUB.  Carollee Herter had recommended cystoscopy.  If he is not scheduled for cystoscopy he could also see Carollee Herter back
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
## 2020-11-22 ENCOUNTER — Other Ambulatory Visit: Payer: Self-pay | Admitting: Family Medicine

## 2020-11-22 DIAGNOSIS — N319 Neuromuscular dysfunction of bladder, unspecified: Secondary | ICD-10-CM

## 2020-11-22 NOTE — Telephone Encounter (Signed)
Patient notified and appointment has been made for KUB and Cysto.

## 2020-11-28 ENCOUNTER — Ambulatory Visit
Admission: RE | Admit: 2020-11-28 | Discharge: 2020-11-28 | Disposition: A | Discharge status: Home / Self Care | Payer: PRIVATE HEALTH INSURANCE | Attending: Urology | Admitting: Urology

## 2020-11-28 ENCOUNTER — Encounter: Payer: Self-pay | Admitting: Urology

## 2020-11-28 ENCOUNTER — Other Ambulatory Visit: Payer: Self-pay

## 2020-11-28 ENCOUNTER — Ambulatory Visit
Admission: RE | Admit: 2020-11-28 | Discharge: 2020-11-28 | Disposition: A | Discharge status: Home / Self Care | Payer: PRIVATE HEALTH INSURANCE | Source: Ambulatory Visit | Attending: Urology | Admitting: Urology

## 2020-11-28 ENCOUNTER — Ambulatory Visit (INDEPENDENT_AMBULATORY_CARE_PROVIDER_SITE_OTHER): Payer: Self-pay | Admitting: Urology

## 2020-11-28 VITALS — BP 130/92 | HR 91 | Ht 65.0 in | Wt 170.0 lb

## 2020-11-28 DIAGNOSIS — N210 Calculus in bladder: Secondary | ICD-10-CM

## 2020-11-28 DIAGNOSIS — N390 Urinary tract infection, site not specified: Secondary | ICD-10-CM

## 2020-11-28 DIAGNOSIS — N319 Neuromuscular dysfunction of bladder, unspecified: Secondary | ICD-10-CM

## 2020-11-28 LAB — URINALYSIS, COMPLETE
Bilirubin, UA: NEGATIVE
Glucose, UA: NEGATIVE
Ketones, UA: NEGATIVE
Nitrite, UA: NEGATIVE
Protein,UA: NEGATIVE
RBC, UA: NEGATIVE
Specific Gravity, UA: 1.02 (ref 1.005–1.030)
Urobilinogen, Ur: 0.2 mg/dL (ref 0.2–1.0)
pH, UA: 6.5 (ref 5.0–7.5)

## 2020-11-28 LAB — MICROSCOPIC EXAMINATION: RBC, Urine: NONE SEEN /hpf (ref 0–2)
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
## 2020-11-29 ENCOUNTER — Other Ambulatory Visit: Payer: Self-pay | Admitting: Urology

## 2020-11-29 DIAGNOSIS — N210 Calculus in bladder: Secondary | ICD-10-CM

## 2020-11-29 MED ORDER — CYCLOBENZAPRINE HCL 10 MG PO TABS: ORAL_TABLET | ORAL | 3 refills | Status: DC

## 2020-11-29 NOTE — Progress Notes (Signed)
Surgical Physician Order Form  * Scheduling expectation : Next Available  *Length of Case: 30 minutes  *Clearance needed: no  *Anticoagulation Instructions: N/A  *Aspirin Instructions: N/A  *Post-op visit Date/Instructions:  1 month follow up with PA  *Diagnosis: Bladder Stone  *Procedure: Cystolitholapaxy <2.5cm (41030)  -Admit type: OUTpatient  -Anesthesia: Choice  -VTE Prophylaxis Standing Order SCD's       Other:   -Standing Lab Orders Per Anesthesia    Lab other:  Urine culture ordered 11/28/2020  -Standing Test orders EKG/Chest x-ray per Anesthesia       Test other:   - Medications:     Gentamicin per pharmacy   Other Instructions:

## 2020-12-05 ENCOUNTER — Telehealth: Payer: Self-pay | Admitting: Urology

## 2020-12-05 NOTE — Progress Notes (Signed)
[facility] Posting Form   Surgery Date/Time: Date: 12/24/2020  Surgeon: Dr. Irineo Axon, MD  Surgery Location: Day Surgery  Inpt ( No  )   Outpt (Yes)   Obs ( No  )   Diagnosis: N21.0 Bladder Stone  -CPT: [phone]  Surgery: Cystolitholapaxy  Stop Anticoagulations: No  Cardiac/Medical/Pulmonary Clearance needed: none needed  *Orders entered into EPIC  Date: 12/05/20   *Case booked in [place]  Date: 12/05/20  *Notified pt of Surgery: Date: 12/05/20  PRE-OP UA & CX: Obtained 11/28/2020  *Placed into Prior Authorization Work Angela Nevin Date: 12/05/20   Assistant/laser/rep:No

## 2020-12-05 NOTE — Telephone Encounter (Signed)
Per Dr. Lonna Cobb Patient is to be scheduled for Cystolitholipaxy  Riley Simmons  was contacted and possible surgical dates were discussed, 12/24/20 was agreed upon for surgery. Patient was directed to call [phone] between 1-3pm the day before surgery to find out surgical arrival time.  Instructions were given not to eat or drink from midnight on the night before surgery and have a driver for the day of surgery. On the surgery day patient was instructed to enter through the Medical Mall entrance of [facility] report the Same Day Surgery desk.   Pre-Admit Testing will be in contact via phone to set up an interview with the anesthesia team to review your history and medications prior to surgery.   Reminder of this information was sent via mychart to the patient.

## 2020-12-09 LAB — CULTURE, URINE COMPREHENSIVE

## 2020-12-11 ENCOUNTER — Telehealth: Payer: Self-pay | Admitting: Urology

## 2020-12-11 NOTE — Addendum Note (Signed)
Addended by: Letta Kocher A on: 12/11/2020 04:50 PM   Modules accepted: Orders, SmartSet

## 2020-12-11 NOTE — Telephone Encounter (Signed)
Preop urine culture grew 2 different antibiotics consistent with colonization since he self caths.  Please send in Rx Macrobid 100 mg twice daily x7 days to start 5 days prior to procedure.  Also change preop antibiotic to vancomycin per pharmacy consult.

## 2020-12-16 ENCOUNTER — Other Ambulatory Visit: Payer: Self-pay

## 2020-12-16 MED ORDER — NITROFURANTOIN MONOHYD MACRO 100 MG PO CAPS: 100.0000 mg | ORAL_CAPSULE | Freq: Two times a day (BID) | ORAL | 0 refills | Status: DC

## 2020-12-16 NOTE — Progress Notes (Signed)
Spoke with pt about urine culture results. Patient to start Macrobid bid x 10 days per Dr. Lonna Cobb. Patient advised and verbalized to send medication to [facility] which I have done.   Patient also requested a note to excuse him from court tomorrow due to UTI Symptoms, after speaking with Dr. Lonna Cobb and Glenice Bow our practice administrator it was determined that we cannot provide a note excusing from court for this reason, advised patient of this as well. Patient verbalized understanding.

## 2020-12-16 NOTE — Telephone Encounter (Signed)
Spoke with pt. Pt. States  that he is having an active infection with a constant urge and frequency. Is it okay to send abx now or would you like to extend the course of treatment?

## 2020-12-16 NOTE — Telephone Encounter (Signed)
Ok! Patient advised.

## 2020-12-16 NOTE — Telephone Encounter (Signed)
Can go ahead and send now and do a 10-day course

## 2020-12-17 ENCOUNTER — Telehealth: Payer: Self-pay

## 2020-12-17 NOTE — Telephone Encounter (Signed)
Patient called in today and states that he has been having blood in his urine since this morning. Has taken 3 doses of the Macrobid prescribed to him yesterday and is scheduled for surgery next Tuesday 11/8. Patient states that he has had frequent bowel movements over last 2 days and is unsure if this is related. Reports that he has been noticing that when he self caths that he has to manipulate his catheter to get a flow. He wanted to check with you and see if he needed to come in or have his abx changed before next week. Please advise.

## 2020-12-17 NOTE — Telephone Encounter (Signed)
Due to his allergies Macrobid was the only option

## 2020-12-18 NOTE — Telephone Encounter (Signed)
Can one of yall talk to him? He is having Catheter Issues that I don't know the answer to. Thanks  . Left message for patient to call us back

## 2020-12-18 NOTE — Telephone Encounter (Signed)
Spoke to patient and he states he was having catheter pain and he took Ibuprofen 800mg , then tramadol and the pain has subsided. The catheter seems to be fine he is passing urine. I informed him to be sure there is no tension on the catheter and it is high on his leg so the catheter can drain properly. Patient voiced understanding.

## 2020-12-19 ENCOUNTER — Inpatient Hospital Stay
Admission: RE | Admit: 2020-12-19 | Discharge: 2020-12-19 | Disposition: A | Discharge status: Home / Self Care | Payer: 59 | Source: Ambulatory Visit

## 2020-12-19 NOTE — Patient Instructions (Signed)
Your procedure is scheduled on: 12/24/20  Report to the Registration Desk on the 1st floor of the Medical Mall. To find out your arrival time, please call [phone] between 1PM - 3PM on: 01/02/21  REMEMBER: Instructions that are not followed completely may result in serious medical risk, up to and including death; or upon the discretion of your surgeon and anesthesiologist your surgery may need to be rescheduled.  Do not eat food OR DRINK ANY FLUIDS after midnight the night before surgery.  No gum chewing, lozengers or hard candies.  TAKE THESE MEDICATIONS THE MORNING OF SURGERY WITH A SIP OF WATER: NONE  One week prior to surgery: STOP TAKING BEGINNING 12/19/20 Stop Anti-inflammatories (NSAIDS) such as Advil, Aleve, Ibuprofen, Motrin, Naproxen, Naprosyn and Aspirin based products such as Excedrin, Goodys Powder, BC Powder.  Stop ANY OVER THE COUNTER supplements until after surgery. You may however, continue to take Tylenol if needed for pain up until the day of surgery.  No Alcohol for 24 hours before or after surgery.  No Smoking including e-cigarettes for 24 hours prior to surgery.  No chewable tobacco products for at least 6 hours prior to surgery.  No nicotine patches on the day of surgery.  Do not use any "recreational" drugs for at least a week prior to your surgery.  Please be advised that the combination of cocaine and anesthesia may have negative outcomes, up to and including death. If you test positive for cocaine, your surgery will be cancelled.  On the morning of surgery brush your teeth with toothpaste and water, you may rinse your mouth with mouthwash if you wish. Do not swallow any toothpaste or mouthwash.  BATHE/SHOWER THE DAY OF YOUR PROCEDURE, YOU MAY APPLY DEODERANT .  Do not wear jewelry, make-up, hairpins, clips or nail polish.  Do not wear lotions, powders, or perfumes.   Do not shave body from the neck down 48 hours prior to surgery just in case you  cut yourself which could leave a site for infection.  Also, freshly shaved skin may become irritated if using the CHG soap.  Contact lenses, hearing aids and dentures may not be worn into surgery.  Do not bring valuables to the hospital. [facility] is not responsible for any missing/lost belongings or valuables.   Notify your doctor if there is any change in your medical condition (cold, fever, infection).  Wear comfortable clothing (specific to your surgery type) to the hospital.  After surgery, you can help prevent lung complications by doing breathing exercises.  Take deep breaths and cough every 1-2 hours. Your doctor may order a device called an Incentive Spirometer to help you take deep breaths. When coughing or sneezing, hold a pillow firmly against your incision with both hands. This is called "splinting." Doing this helps protect your incision. It also decreases belly discomfort.  If you are being admitted to the hospital overnight, leave your suitcase in the car. After surgery it may be brought to your room.  If you are being discharged the day of surgery, you will not be allowed to drive home. You will need a responsible adult (18 years or older) to drive you home and stay with you that night.   If you are taking public transportation, you will need to have a responsible adult (18 years or older) with you. Please confirm with your physician that it is acceptable to use public transportation.   Please call the Pre-admissions Testing Dept. at [phone] if you have  any questions about these instructions.  Surgery Visitation Policy:  Patients undergoing a surgery or procedure may have one family member or support person with them as long as that person is not COVID-19 positive or experiencing its symptoms.  That person may remain in the waiting area during the procedure and may rotate out with other people.  Inpatient Visitation:    Visiting hours are 7 a.m. to 8  p.m. Up to two visitors ages 16+ are allowed at one time in a patient room. The visitors may rotate out with other people during the day. Visitors must check out when they leave, or other visitors will not be allowed. One designated support person may remain overnight. The visitor must pass COVID-19 screenings, use hand sanitizer when entering and exiting the patient's room and wear a mask at all times, including in the patient's room. Patients must also wear a mask when staff or their visitor are in the room. Masking is required regardless of vaccination status.

## 2020-12-20 ENCOUNTER — Encounter
Admission: RE | Admit: 2020-12-20 | Discharge: 2020-12-20 | Disposition: A | Discharge status: Home / Self Care | Payer: 59 | Source: Ambulatory Visit | Attending: Urology | Admitting: Urology

## 2020-12-20 ENCOUNTER — Other Ambulatory Visit: Payer: Self-pay

## 2020-12-20 HISTORY — DX: Personal history of urinary calculi: Z87.442

## 2020-12-20 NOTE — Patient Instructions (Signed)
Your procedure is scheduled on: 12/24/20 Report to DAY SURGERY DEPARTMENT LOCATED ON 2ND FLOOR MEDICAL MALL ENTRANCE. To find out your arrival time please call [phone] between 1PM - 3PM on 12/23/20.  Remember: Instructions that are not followed completely may result in serious medical risk, up to and including death, or upon the discretion of your surgeon and anesthesiologist your surgery may need to be rescheduled.     _X__ 1. Do not eat any food or drink any liquids after midnight the night before your procedure.                 No gum chewing or hard candies.   __X__2.  On the morning of surgery brush your teeth with toothpaste and water, you                 may rinse your mouth with mouthwash if you wish.  Do not swallow any              toothpaste of mouthwash.     _X__ 3.  No Alcohol for 24 hours before or after surgery.   _X__ 4.  Do Not Smoke or use e-cigarettes For 24 Hours Prior to Your Surgery.                 Do not use any chewable tobacco products for at least 6 hours prior to                 surgery.  ____  5.  Bring all medications with you on the day of surgery if instructed.   __X__  6.  Notify your doctor if there is any change in your medical condition      (cold, fever, infections).     Do not wear jewelry, make-up, hairpins, clips or nail polish. Do not wear lotions, powders, or perfumes.  Do not shave body hair 48 hours prior to surgery. Men may shave face and neck. Do not bring valuables to the hospital.    [facility] is not responsible for any belongings or valuables.  Contacts, dentures/partials or body piercings may not be worn into surgery. Bring a case for your contacts, glasses or hearing aids, a denture cup will be supplied. Leave your suitcase in the car. After surgery it may be brought to your room. For patients admitted to the hospital, discharge time is determined by your treatment team.   Patients discharged the day of surgery will not be  allowed to drive home.   Please read over the following fact sheets that you were given:     __X__ Take these medicines the morning of surgery with A SIP OF WATER:    1. none  2.   3.   4.  5.  6.  ____ Fleet Enema (as directed)   ____ Use CHG Soap/SAGE wipes as directed  ____ Use inhalers on the day of surgery  ____ Stop metformin/Janumet/Farxiga 2 days prior to surgery    ____ Take 1/2 of usual insulin dose the night before surgery. No insulin the morning          of surgery.   ____ Stop Blood Thinners Coumadin/Plavix/Xarelto/Pleta/Pradaxa/Eliquis/Effient/Aspirin  on   Or contact your Surgeon, Cardiologist or Medical Doctor regarding  ability to stop your blood thinners  __X__ Stop Anti-inflammatories 7 days before surgery such as Advil, Ibuprofen, Motrin,  BC or Goodies Powder, Naprosyn, Naproxen, Aleve, Aspirin    __X__ Stop all herbal supplements, fish oil or  vitamin E until after surgery.    ____ Bring C-Pap to the hospital.    We understand how difficult it can be to make time for these appointments when time away from work is limited. We strive to provide the best care for our patients and we appreciate your time. Have a great weekend and good luck with your surgery./cn

## 2020-12-24 ENCOUNTER — Encounter: Payer: Self-pay | Admitting: Urology

## 2020-12-24 ENCOUNTER — Encounter
Admission: RE | Disposition: A | Discharge status: Home / Self Care | Payer: Self-pay | Source: Home / Self Care | Attending: Urology

## 2020-12-24 ENCOUNTER — Ambulatory Visit: Payer: PRIVATE HEALTH INSURANCE | Admitting: Anesthesiology

## 2020-12-24 ENCOUNTER — Ambulatory Visit
Admission: RE | Admit: 2020-12-24 | Discharge: 2020-12-24 | Disposition: A | Discharge status: Home / Self Care | Payer: PRIVATE HEALTH INSURANCE | Attending: Urology | Admitting: Urology

## 2020-12-24 ENCOUNTER — Other Ambulatory Visit: Payer: Self-pay

## 2020-12-24 DIAGNOSIS — N210 Calculus in bladder: Secondary | ICD-10-CM

## 2020-12-24 DIAGNOSIS — Z87440 Personal history of urinary (tract) infections: Secondary | ICD-10-CM | POA: Insufficient documentation

## 2020-12-24 DIAGNOSIS — Q059 Spina bifida, unspecified: Secondary | ICD-10-CM | POA: Insufficient documentation

## 2020-12-24 DIAGNOSIS — N319 Neuromuscular dysfunction of bladder, unspecified: Secondary | ICD-10-CM | POA: Insufficient documentation

## 2020-12-24 DIAGNOSIS — Z87891 Personal history of nicotine dependence: Secondary | ICD-10-CM | POA: Insufficient documentation

## 2020-12-24 HISTORY — PX: CYSTOSCOPY WITH LITHOLAPAXY: SHX1425

## 2020-12-24 SURGERY — CYSTOSCOPY, WITH BLADDER CALCULUS LITHOLAPAXY
Anesthesia: General

## 2020-12-24 MED ORDER — DEXAMETHASONE SODIUM PHOSPHATE 10 MG/ML IJ SOLN
INTRAMUSCULAR | Status: DC | PRN
  Administered 2020-12-24: 10 mg via INTRAVENOUS

## 2020-12-24 MED ORDER — CHLORHEXIDINE GLUCONATE 0.12 % MT SOLN
OROMUCOSAL | Status: AC
  Administered 2020-12-24: 15 mL via OROMUCOSAL
  Filled 2020-12-24: qty 15

## 2020-12-24 MED ORDER — ONDANSETRON HCL 4 MG/2ML IJ SOLN: 4.0000 mg | Freq: Once | INTRAMUSCULAR | Status: DC | PRN

## 2020-12-24 MED ORDER — ORAL CARE MOUTH RINSE: 15.0000 mL | Freq: Once | OROMUCOSAL | Status: AC

## 2020-12-24 MED ORDER — LACTATED RINGERS IV SOLN: INTRAVENOUS | Status: DC

## 2020-12-24 MED ORDER — LIDOCAINE HCL URETHRAL/MUCOSAL 2 % EX GEL
CUTANEOUS | Status: DC | PRN
  Administered 2020-12-24: 1

## 2020-12-24 MED ORDER — LABETALOL HCL 5 MG/ML IV SOLN
INTRAVENOUS | Status: AC
  Filled 2020-12-24: qty 4

## 2020-12-24 MED ORDER — HYDROCODONE-ACETAMINOPHEN 5-325 MG PO TABS: 1.0000 | ORAL_TABLET | Freq: Four times a day (QID) | ORAL | 0 refills | Status: DC | PRN

## 2020-12-24 MED ORDER — ONDANSETRON HCL 4 MG/2ML IJ SOLN
INTRAMUSCULAR | Status: DC | PRN
  Administered 2020-12-24: 4 mg via INTRAVENOUS

## 2020-12-24 MED ORDER — LABETALOL HCL 5 MG/ML IV SOLN
5.0000 mg | Freq: Once | INTRAVENOUS | Status: AC
Start: 2020-12-24 — End: 2020-12-24
  Administered 2020-12-24: 5 mg via INTRAVENOUS
  Filled 2020-12-24: qty 4

## 2020-12-24 MED ORDER — DIPHENHYDRAMINE HCL 50 MG/ML IJ SOLN
INTRAMUSCULAR | Status: AC
  Filled 2020-12-24: qty 1

## 2020-12-24 MED ORDER — OXYCODONE HCL 5 MG/5ML PO SOLN: 5.0000 mg | Freq: Once | ORAL | Status: AC | PRN

## 2020-12-24 MED ORDER — OXYCODONE HCL 5 MG PO TABS
5.0000 mg | ORAL_TABLET | Freq: Once | ORAL | Status: AC | PRN
  Administered 2020-12-24: 5 mg via ORAL

## 2020-12-24 MED ORDER — SODIUM CHLORIDE (PF) 0.9 % IJ SOLN
INTRAMUSCULAR | Status: AC
  Filled 2020-12-24: qty 10

## 2020-12-24 MED ORDER — MIDAZOLAM HCL 2 MG/2ML IJ SOLN
INTRAMUSCULAR | Status: AC
  Filled 2020-12-24: qty 2

## 2020-12-24 MED ORDER — MIDAZOLAM HCL 2 MG/2ML IJ SOLN
INTRAMUSCULAR | Status: DC | PRN
  Administered 2020-12-24: 2 mg via INTRAVENOUS

## 2020-12-24 MED ORDER — STERILE WATER FOR IRRIGATION IR SOLN
Status: DC | PRN
  Administered 2020-12-24: 10 mL

## 2020-12-24 MED ORDER — VANCOMYCIN HCL IN DEXTROSE 1-5 GM/200ML-% IV SOLN: 1000.0000 mg | INTRAVENOUS | Status: AC

## 2020-12-24 MED ORDER — VANCOMYCIN HCL IN DEXTROSE 1-5 GM/200ML-% IV SOLN
INTRAVENOUS | Status: AC
  Administered 2020-12-24: 1000 mg via INTRAVENOUS
  Filled 2020-12-24: qty 200

## 2020-12-24 MED ORDER — PROPOFOL 10 MG/ML IV BOLUS
INTRAVENOUS | Status: DC | PRN
  Administered 2020-12-24: 200 mg via INTRAVENOUS
  Administered 2020-12-24: 50 mg via INTRAVENOUS

## 2020-12-24 MED ORDER — CHLORHEXIDINE GLUCONATE 0.12 % MT SOLN: 15.0000 mL | Freq: Once | OROMUCOSAL | Status: AC

## 2020-12-24 MED ORDER — FAMOTIDINE 20 MG PO TABS
ORAL_TABLET | ORAL | Status: AC
  Administered 2020-12-24: 20 mg via ORAL
  Filled 2020-12-24: qty 1

## 2020-12-24 MED ORDER — LIDOCAINE HCL (CARDIAC) PF 100 MG/5ML IV SOSY
PREFILLED_SYRINGE | INTRAVENOUS | Status: DC | PRN
  Administered 2020-12-24: 100 mg via INTRAVENOUS

## 2020-12-24 MED ORDER — FENTANYL CITRATE (PF) 100 MCG/2ML IJ SOLN
INTRAMUSCULAR | Status: AC
  Filled 2020-12-24: qty 2

## 2020-12-24 MED ORDER — LABETALOL HCL 5 MG/ML IV SOLN
5.0000 mg | Freq: Once | INTRAVENOUS | Status: AC
Start: 2020-12-24 — End: 2020-12-24
  Administered 2020-12-24: 5 mg via INTRAVENOUS

## 2020-12-24 MED ORDER — SODIUM CHLORIDE 0.9 % IR SOLN
Status: DC | PRN
  Administered 2020-12-24: 2000 mL

## 2020-12-24 MED ORDER — FENTANYL CITRATE (PF) 100 MCG/2ML IJ SOLN: 25.0000 ug | INTRAMUSCULAR | Status: DC | PRN

## 2020-12-24 MED ORDER — OXYCODONE HCL 5 MG PO TABS
ORAL_TABLET | ORAL | Status: AC
  Filled 2020-12-24: qty 1

## 2020-12-24 MED ORDER — FAMOTIDINE 20 MG PO TABS: 20.0000 mg | ORAL_TABLET | Freq: Once | ORAL | Status: AC

## 2020-12-24 MED ORDER — LIDOCAINE HCL URETHRAL/MUCOSAL 2 % EX GEL
CUTANEOUS | Status: AC
  Filled 2020-12-24: qty 10

## 2020-12-24 MED ORDER — DIPHENHYDRAMINE HCL 50 MG/ML IJ SOLN
INTRAMUSCULAR | Status: DC | PRN
  Administered 2020-12-24: 50 mg via INTRAVENOUS

## 2020-12-24 MED ORDER — ACETAMINOPHEN 10 MG/ML IV SOLN: 1000.0000 mg | Freq: Once | INTRAVENOUS | Status: DC | PRN

## 2020-12-24 MED ORDER — FENTANYL CITRATE (PF) 100 MCG/2ML IJ SOLN
INTRAMUSCULAR | Status: DC | PRN
  Administered 2020-12-24 (×2): 50 ug via INTRAVENOUS

## 2020-12-24 MED ORDER — OXYBUTYNIN CHLORIDE 5 MG PO TABS: ORAL_TABLET | ORAL | 0 refills | Status: DC

## 2020-12-24 SURGICAL SUPPLY — 26 items
BAG DRAIN CYSTO-URO LG1000N (MISCELLANEOUS) ×2 IMPLANT
BAG DRN RND TRDRP ANRFLXCHMBR (UROLOGICAL SUPPLIES) ×1
BAG URINE DRAIN 2000ML AR STRL (UROLOGICAL SUPPLIES) ×1 IMPLANT
BASKET ZERO TIP 1.9FR (BASKET) IMPLANT
BSKT STON RTRVL ZERO TP 1.9FR (BASKET)
CATH FOL 2WAY LX 18X30 (CATHETERS) IMPLANT
CATH FOLEY SIL 2WAY 14FR5CC (CATHETERS) ×1 IMPLANT
FIBER LASER MOSES 550 DFL (Laser) ×1 IMPLANT
GAUZE 4X4 16PLY ~~LOC~~+RFID DBL (SPONGE) ×4 IMPLANT
GLOVE SURG UNDER POLY LF SZ7.5 (GLOVE) ×2 IMPLANT
GOWN STRL REUS W/ TWL LRG LVL3 (GOWN DISPOSABLE) ×2 IMPLANT
GOWN STRL REUS W/ TWL XL LVL3 (GOWN DISPOSABLE) ×1 IMPLANT
GOWN STRL REUS W/TWL LRG LVL3 (GOWN DISPOSABLE) ×4
GOWN STRL REUS W/TWL XL LVL3 (GOWN DISPOSABLE) ×2
IV NS IRRIG 3000ML ARTHROMATIC (IV SOLUTION) ×4 IMPLANT
KIT PROBE TRILOGY 3.9X350 (MISCELLANEOUS) IMPLANT
KIT TURNOVER CYSTO (KITS) ×2 IMPLANT
MANIFOLD NEPTUNE II (INSTRUMENTS) ×2 IMPLANT
PACK CYSTO AR (MISCELLANEOUS) ×2 IMPLANT
SET IRRIG Y TYPE TUR BLADDER L (SET/KITS/TRAYS/PACK) ×2 IMPLANT
SURGILUBE 2OZ TUBE FLIPTOP (MISCELLANEOUS) ×2 IMPLANT
SYR TOOMEY IRRIG 70ML (MISCELLANEOUS) ×2
SYRINGE TOOMEY IRRIG 70ML (MISCELLANEOUS) ×1 IMPLANT
WATER STERILE IRR 1000ML POUR (IV SOLUTION) ×2 IMPLANT
WATER STERILE IRR 3000ML UROMA (IV SOLUTION) IMPLANT
WATER STERILE IRR 500ML POUR (IV SOLUTION) ×2 IMPLANT

## 2020-12-24 NOTE — Interval H&P Note (Signed)
History and Physical Interval Note:  12/24/2020 9:10 AM  Riley Simmons  has presented today for surgery, with the diagnosis of Bladder Stone.  The various methods of treatment have been discussed with the patient and family. After consideration of risks, benefits and other options for treatment, the patient has consented to  Procedure(s): CYSTOSCOPY WITH LITHOLAPAXY (N/A) as a surgical intervention.  The patient's history has been reviewed, patient examined, no change in status, stable for surgery.  I have reviewed the patient's chart and labs.  Questions were answered to the patient's satisfaction.     Johnpaul Gillentine C Camira Geidel

## 2020-12-24 NOTE — Anesthesia Procedure Notes (Signed)
Procedure Name: LMA Insertion Date/Time: 12/24/2020 9:51 AM Performed by: Junious Silk, CRNA Pre-anesthesia Checklist: Patient identified, Patient being monitored, Timeout performed, Emergency Drugs available and Suction available Patient Re-evaluated:Patient Re-evaluated prior to induction Oxygen Delivery Method: Circle system utilized Preoxygenation: Pre-oxygenation with 100% oxygen Induction Type: IV induction Ventilation: Mask ventilation without difficulty LMA: LMA inserted LMA Size: 4.0 Tube type: Oral Number of attempts: 1 Placement Confirmation: positive ETCO2 and breath sounds checked- equal and bilateral Tube secured with: Tape Dental Injury: Teeth and Oropharynx as per pre-operative assessment

## 2020-12-24 NOTE — Interval H&P Note (Signed)
History and Physical Interval Note:  CV:RRR Lungs:clear  12/24/2020 9:10 AM  Wilmore L Gahm  has presented today for surgery, with the diagnosis of Bladder Stone.  The various methods of treatment have been discussed with the patient and family. After consideration of risks, benefits and other options for treatment, the patient has consented to  Procedure(s): CYSTOSCOPY WITH LITHOLAPAXY (N/A) as a surgical intervention.  The patient's history has been reviewed, patient examined, no change in status, stable for surgery.  I have reviewed the patient's chart and labs.  Questions were answered to the patient's satisfaction.     Chaise Mahabir C Juletta Berhe

## 2020-12-24 NOTE — Discharge Instructions (Addendum)
Cystoscopy patient instructions  Following a cystoscopy, a catheter (a flexible rubber tube) is sometimes left in place to empty the bladder. This may cause some discomfort or a feeling that you need to urinate. Your doctor determines the period of time that the catheter will be left in place. You may have bloody urine for two to three days (Call your doctor if the amount of bleeding increases or does not subside).  You may pass blood clots in your urine, especially if you had a biopsy. It is not unusual to pass small blood clots and have some bloody urine a couple of weeks after your cystoscopy. Again, call your doctor if the bleeding does not subside. You may have: Dysuria (painful urination) Frequency (urinating often) Urgency (strong desire to urinate)  These symptoms are common especially if medicine is instilled into the bladder or a ureteral stent is placed. Avoiding alcohol and caffeine, such as coffee, tea, and chocolate, may help relieve these symptoms. Drink plenty of water, unless otherwise instructed. Your doctor may also prescribe an antibiotic or other medicine to reduce these symptoms.  Cystoscopy results are available soon after the procedure; biopsy results usually take two to four days. Your doctor will discuss the results of your exam with you. Before you go home, you will be given specific instructions for follow-up care. Special Instructions:   If you are going home with a catheter in place do not take a tub bath until removed by your doctor.   You may resume your normal activities.   Do not drive or operate machinery if you are taking narcotic pain medicine.   Be sure to keep all follow-up appointments with your doctor.   Call Your Doctor If: The catheter is not draining You have severe pain You are unable to urinate You have a fever over 101 You have severe bleeding         AMBULATORY SURGERY  DISCHARGE INSTRUCTIONS   The drugs that you were given will stay in  your system until tomorrow so for the next 24 hours you should not:  Drive an automobile Make any legal decisions Drink any alcoholic beverage   You may resume regular meals tomorrow.  Today it is better to start with liquids and gradually work up to solid foods.  You may eat anything you prefer, but it is better to start with liquids, then soup and crackers, and gradually work up to solid foods.   Please notify your doctor immediately if you have any unusual bleeding, trouble breathing, redness and pain at the surgery site, drainage, fever, or pain not relieved by medication.    Additional Instructions:        Please contact your physician with any problems or Same Day Surgery at [phone], Monday through Friday 6 am to 4 pm, or [place] [facility] number at [phone].  

## 2020-12-24 NOTE — Anesthesia Postprocedure Evaluation (Signed)
Anesthesia Post Note  Patient: Riley Simmons  Procedure(s) Performed: CYSTOSCOPY WITH LITHOLAPAXY  Patient location during evaluation: PACU Anesthesia Type: General Level of consciousness: awake and alert, oriented and patient cooperative Pain management: pain level controlled Vital Signs Assessment: post-procedure vital signs reviewed and stable Respiratory status: spontaneous breathing, nonlabored ventilation and respiratory function stable Cardiovascular status: blood pressure returned to baseline and stable Postop Assessment: adequate PO intake Anesthetic complications: no   No notable events documented.   Last Vitals:  Vitals:   12/24/20 1034 12/24/20 1045  BP: (!) 149/93 (!) 148/90  Pulse: (!) 108 94  Resp: (!) 23 18  Temp:    SpO2: 96% 97%    Last Pain:  Vitals:   12/24/20 1045  TempSrc:   PainSc: 0-No pain                 Reed Breech

## 2020-12-24 NOTE — Anesthesia Preprocedure Evaluation (Addendum)
Anesthesia Evaluation  Patient identified by MRN, date of birth, ID band Patient awake    Reviewed: Allergy & Precautions, NPO status , Patient's Chart, lab work & pertinent test results  History of Anesthesia Complications Negative for: history of anesthetic complications  Airway Mallampati: IV   Neck ROM: Full    Dental  (+)    Pulmonary former smoker (quit 1 month ago),    Pulmonary exam normal breath sounds clear to auscultation       Cardiovascular Exercise Tolerance: Good negative cardio ROS Normal cardiovascular exam Rhythm:Regular Rate:Normal     Neuro/Psych Marijuana use; scoliosis; spina bifida s/p spinal surgery as infant    GI/Hepatic negative GI ROS,   Endo/Other  negative endocrine ROS  Renal/GU Renal disease (nephrolithiasis)   Neurogenic bladder    Musculoskeletal   Abdominal   Peds  Hematology negative hematology ROS (+)   Anesthesia Other Findings   Reproductive/Obstetrics                            Anesthesia Physical Anesthesia Plan  ASA: 3  Anesthesia Plan: General   Post-op Pain Management:    Induction: Intravenous  PONV Risk Score and Plan: 2 and Ondansetron, Dexamethasone and Treatment may vary due to age or medical condition  Airway Management Planned: Oral ETT  Additional Equipment:   Intra-op Plan:   Post-operative Plan: Extubation in OR  Informed Consent: I have reviewed the patients History and Physical, chart, labs and discussed the procedure including the risks, benefits and alternatives for the proposed anesthesia with the patient or authorized representative who has indicated his/her understanding and acceptance.     Dental advisory given  Plan Discussed with: CRNA  Anesthesia Plan Comments: (Patient consented for risks of anesthesia including but not limited to:  - adverse reactions to medications - damage to eyes, teeth, lips or  other oral mucosa - nerve damage due to positioning  - sore throat or hoarseness - damage to heart, brain, nerves, lungs, other parts of body or loss of life  Informed patient about role of CRNA in peri- and intra-operative care.  Patient voiced understanding.)        Anesthesia Quick Evaluation

## 2020-12-24 NOTE — Transfer of Care (Signed)
Immediate Anesthesia Transfer of Care Note  Patient: Riley Simmons  Procedure(s) Performed: CYSTOSCOPY WITH LITHOLAPAXY  Patient Location: PACU  Anesthesia Type:General  Level of Consciousness: sedated  Airway & Oxygen Therapy: Patient Spontanous Breathing and Patient connected to face mask oxygen  Post-op Assessment: Report given to RN and Post -op Vital signs reviewed and stable  Post vital signs: Reviewed and stable  Last Vitals:  Vitals Value Taken Time  BP 149/93 12/24/20 1034  Temp    Pulse 80 12/24/20 1036  Resp 18 12/24/20 1036  SpO2 95 % 12/24/20 1036  Vitals shown include unvalidated device data.  Last Pain:  Vitals:   12/24/20 0755  TempSrc: Temporal  PainSc: 5          Complications: No notable events documented.

## 2020-12-24 NOTE — Op Note (Signed)
Preoperative diagnosis:  Bladder calculus (< 2.5 cm)  Postoperative diagnosis:  Same  Procedure: Cystolitholapaxy  Surgeon: Riki Altes, MD  Anesthesia: General  Complications: None  Intraoperative findings:  Small caliber, tight urethra; squamous metaplasia distal urethra.  Prostate nonocclusive.  Moderate bladder trabeculation with cellules and synechia.  Bladder calculus at base  EBL: Minimal  Specimens: None  Indication: Riley Simmons is a 25 y.o. with a neurogenic bladder secondary to spina bifida on intermittent catheterization.  He was seen February 2022 and found to have a 9 mm bladder calculus on imaging and declined treatment.  Follow-up [facility] October 2022 with increased in size to 14 mm.  After reviewing the management options for treatment, he elected to proceed with the above surgical procedure(s). We have discussed the potential benefits and risks of the procedure, side effects of the proposed treatment, the likelihood of the patient achieving the goals of the procedure, and any potential problems that might occur during the procedure or recuperation. Informed consent has been obtained.  Description of procedure:  The patient was taken to the operating room and general anesthesia was induced.  The patient was placed in the dorsal lithotomy position, prepped and draped in the usual sterile fashion, and preoperative antibiotics were administered. A preoperative time-out was performed.   The urethral meatus would not except a 22 French continuous-flow resectoscope sheath.  The urethra was gently dilated with Sissy Hoff sounds from 16-20 [place] and the penile urethra was very tight.  Due to these findings it was elected to place a 17 French cystoscope which was advanced through the penile urethra with minimal difficulty.  Panendoscopy was performed once the bladder was entered with findings as described above.  A 550 m holmium laser fiber was then placed through the  cystoscope and the calculus was dusted at initial settings of 0.3J/80 Hz and as the stone became smaller the settings were decreased to 0.3/60 then 0.3/40.  Once the stone was completely fragmented noncontact laser lithotripsy was performed at 0.6/40.  All particles were then removed from the bladder via irrigation.  With the exception of fine dust no fragments remaining at the completion of the procedure.  Due to the need for urethral dilation it was elected to place a 14 French catheter which was placed without difficulty to gravity drainage.  Plan: Catheter removal 48 hours-he was instructed by nursing on how to remove Postop follow-up scheduled 01/21/2021   Riki Altes, M.D.

## 2020-12-25 ENCOUNTER — Encounter: Payer: Self-pay | Admitting: Urology

## 2020-12-26 ENCOUNTER — Telehealth: Payer: Self-pay

## 2020-12-26 NOTE — Telephone Encounter (Signed)
Incoming call from pt on triage line who questions if he may remove his catheter. Advised pt that per Dr. Heywood Footman note he may remove cath today. Catheter removal instructions reviewed with pt in detail.

## 2020-12-27 ENCOUNTER — Telehealth: Payer: Self-pay

## 2020-12-27 NOTE — Telephone Encounter (Signed)
Pt states still having pain. He states he is passing fragments and would like something else called in for pain. He is almost out of hydrocodone I told pt to take oxybutynin TID as needed. Pt states the oxybutynin hurts his stomach.

## 2020-12-30 MED ORDER — TRAMADOL HCL 50 MG PO TABS
50.0000 mg | ORAL_TABLET | Freq: Four times a day (QID) | ORAL | 0 refills | Status: DC | PRN
Start: 2020-12-30 — End: 2022-10-17

## 2020-12-30 NOTE — Telephone Encounter (Signed)
Rx tramadol sent to pharmacy 

## 2021-01-02 ENCOUNTER — Ambulatory Visit: Payer: Self-pay | Admitting: Urology

## 2021-01-21 ENCOUNTER — Ambulatory Visit: Payer: Self-pay | Admitting: Physician Assistant

## 2021-01-21 ENCOUNTER — Encounter: Payer: Self-pay | Admitting: Physician Assistant

## 2021-05-20 ENCOUNTER — Telehealth: Payer: Self-pay | Admitting: Urology

## 2021-05-20 NOTE — Telephone Encounter (Signed)
[employer] called asking if you got the form for his catheter supplies? ?Please call them back at [phone] ?

## 2021-05-27 NOTE — Telephone Encounter (Signed)
[employer] called back inquiring about the form for the patient's catheter supplies.  I did not see any documentation or form on Stoioff's desk.  I asked her to re-fax the documents. ?

## 2021-06-02 NOTE — Telephone Encounter (Signed)
Form has been received and faxed back to [employer]. ?

## 2022-05-01 ENCOUNTER — Telehealth: Payer: Self-pay | Admitting: Family Medicine

## 2022-05-01 DIAGNOSIS — N319 Neuromuscular dysfunction of bladder, unspecified: Secondary | ICD-10-CM

## 2022-05-01 DIAGNOSIS — G479 Sleep disorder, unspecified: Secondary | ICD-10-CM

## 2022-05-01 NOTE — Progress Notes (Addendum)
[place]  Needs in person given UTI symptoms with neurogenic bladder. Also reports sleep trouble- and desires help with that.  Patient acknowledged agreement and understanding of the plan.

## 2022-10-17 ENCOUNTER — Other Ambulatory Visit: Payer: Self-pay

## 2022-10-17 ENCOUNTER — Inpatient Hospital Stay (HOSPITAL_COMMUNITY)
Admission: EM | Admit: 2022-10-17 | Discharge: 2022-10-19 | DRG: 699 | Disposition: A | Discharge status: Home / Self Care | Payer: Self-pay | Attending: Internal Medicine | Admitting: Internal Medicine

## 2022-10-17 ENCOUNTER — Emergency Department (HOSPITAL_COMMUNITY): Payer: Self-pay

## 2022-10-17 ENCOUNTER — Encounter (HOSPITAL_COMMUNITY): Payer: Self-pay

## 2022-10-17 DIAGNOSIS — B9620 Unspecified Escherichia coli [E. coli] as the cause of diseases classified elsewhere: Secondary | ICD-10-CM | POA: Diagnosis present

## 2022-10-17 DIAGNOSIS — Z880 Allergy status to penicillin: Secondary | ICD-10-CM

## 2022-10-17 DIAGNOSIS — Z87891 Personal history of nicotine dependence: Secondary | ICD-10-CM

## 2022-10-17 DIAGNOSIS — T83518A Infection and inflammatory reaction due to other urinary catheter, initial encounter: Principal | ICD-10-CM | POA: Diagnosis present

## 2022-10-17 DIAGNOSIS — N200 Calculus of kidney: Secondary | ICD-10-CM | POA: Diagnosis present

## 2022-10-17 DIAGNOSIS — Q059 Spina bifida, unspecified: Secondary | ICD-10-CM

## 2022-10-17 DIAGNOSIS — Z881 Allergy status to other antibiotic agents status: Secondary | ICD-10-CM

## 2022-10-17 DIAGNOSIS — Y846 Urinary catheterization as the cause of abnormal reaction of the patient, or of later complication, without mention of misadventure at the time of the procedure: Secondary | ICD-10-CM | POA: Diagnosis present

## 2022-10-17 DIAGNOSIS — N3289 Other specified disorders of bladder: Secondary | ICD-10-CM | POA: Diagnosis present

## 2022-10-17 DIAGNOSIS — Z91040 Latex allergy status: Secondary | ICD-10-CM

## 2022-10-17 DIAGNOSIS — Z87442 Personal history of urinary calculi: Secondary | ICD-10-CM

## 2022-10-17 DIAGNOSIS — N12 Tubulo-interstitial nephritis, not specified as acute or chronic: Secondary | ICD-10-CM | POA: Diagnosis present

## 2022-10-17 DIAGNOSIS — N319 Neuromuscular dysfunction of bladder, unspecified: Secondary | ICD-10-CM | POA: Diagnosis present

## 2022-10-17 DIAGNOSIS — Z79899 Other long term (current) drug therapy: Secondary | ICD-10-CM

## 2022-10-17 DIAGNOSIS — M419 Scoliosis, unspecified: Secondary | ICD-10-CM | POA: Diagnosis present

## 2022-10-17 LAB — URINALYSIS, W/ REFLEX TO CULTURE (INFECTION SUSPECTED)
Bilirubin Urine: NEGATIVE
Glucose, UA: NEGATIVE mg/dL
Ketones, ur: NEGATIVE mg/dL
Nitrite: NEGATIVE
Protein, ur: 30 mg/dL — AB
Specific Gravity, Urine: 1.015 (ref 1.005–1.030)
WBC, UA: >50 WBC/hpf (ref 0–5)
pH: 6 (ref 5.0–8.0)

## 2022-10-17 LAB — COMPREHENSIVE METABOLIC PANEL
ALT: 13 U/L (ref 0–44)
AST: 14 U/L — ABNORMAL LOW (ref 15–41)
Albumin: 4.3 g/dL (ref 3.5–5.0)
Alkaline Phosphatase: 107 U/L (ref 38–126)
Anion gap: 9 (ref 5–15)
BUN: 13 mg/dL (ref 6–20)
CO2: 23 mmol/L (ref 22–32)
Calcium: 8.3 mg/dL — ABNORMAL LOW (ref 8.9–10.3)
Chloride: 100 mmol/L (ref 98–111)
Creatinine, Ser: 1.07 mg/dL (ref 0.61–1.24)
GFR, Estimated: >60 mL/min (ref >60)
Glucose, Bld: 109 mg/dL — ABNORMAL HIGH (ref 70–99)
Potassium: 3.7 mmol/L (ref 3.5–5.1)
Sodium: 132 mmol/L — ABNORMAL LOW (ref 135–145)
Total Bilirubin: 0.9 mg/dL (ref 0.3–1.2)
Total Protein: 7.5 g/dL (ref 6.5–8.1)

## 2022-10-17 LAB — CBC WITH DIFFERENTIAL/PLATELET
Abs Immature Granulocytes: 0.06 10*3/uL (ref 0.00–0.07)
Basophils Absolute: 0.1 10*3/uL (ref 0.0–0.1)
Basophils Relative: 1 %
Eosinophils Absolute: 0 10*3/uL (ref 0.0–0.5)
Eosinophils Relative: 0 %
HCT: 44.5 % (ref 39.0–52.0)
Hemoglobin: 15.1 g/dL (ref 13.0–17.0)
Immature Granulocytes: 0 %
Lymphocytes Relative: 9 %
Lymphs Abs: 1.6 10*3/uL (ref 0.7–4.0)
MCH: 30.4 pg (ref 26.0–34.0)
MCHC: 33.9 g/dL (ref 30.0–36.0)
MCV: 89.7 fL (ref 80.0–100.0)
Monocytes Absolute: 1.9 10*3/uL — ABNORMAL HIGH (ref 0.1–1.0)
Monocytes Relative: 11 %
Neutro Abs: 13.9 10*3/uL — ABNORMAL HIGH (ref 1.7–7.7)
Neutrophils Relative %: 79 %
Platelets: 240 10*3/uL (ref 150–400)
RBC: 4.96 MIL/uL (ref 4.22–5.81)
RDW: 12.7 % (ref 11.5–15.5)
WBC: 17.6 10*3/uL — ABNORMAL HIGH (ref 4.0–10.5)
nRBC: 0 % (ref 0.0–0.2)

## 2022-10-17 LAB — I-STAT CHEM 8, ED
BUN: 13 mg/dL (ref 6–20)
Calcium, Ion: 1.08 mmol/L — ABNORMAL LOW (ref 1.15–1.40)
Chloride: 102 mmol/L (ref 98–111)
Creatinine, Ser: 1.1 mg/dL (ref 0.61–1.24)
Glucose, Bld: 107 mg/dL — ABNORMAL HIGH (ref 70–99)
HCT: 45 % (ref 39.0–52.0)
Hemoglobin: 15.3 g/dL (ref 13.0–17.0)
Potassium: 4 mmol/L (ref 3.5–5.1)
Sodium: 136 mmol/L (ref 135–145)
TCO2: 23 mmol/L (ref 22–32)

## 2022-10-17 LAB — PROTIME-INR
INR: 1.1 (ref 0.8–1.2)
Prothrombin Time: 14.7 seconds (ref 11.4–15.2)

## 2022-10-17 LAB — HIV ANTIBODY (ROUTINE TESTING W REFLEX): HIV Screen 4th Generation wRfx: NONREACTIVE

## 2022-10-17 LAB — I-STAT CG4 LACTIC ACID, ED: Lactic Acid, Venous: 0.8 mmol/L (ref 0.5–1.9)

## 2022-10-17 LAB — APTT: aPTT: 33 seconds (ref 24–36)

## 2022-10-17 MED ORDER — TRAZODONE HCL 50 MG PO TABS: 25.0000 mg | ORAL_TABLET | Freq: Every evening | ORAL | Status: DC | PRN

## 2022-10-17 MED ORDER — ONDANSETRON HCL 4 MG/2ML IJ SOLN: 4.0000 mg | Freq: Four times a day (QID) | INTRAMUSCULAR | Status: DC | PRN

## 2022-10-17 MED ORDER — HYDROMORPHONE HCL 1 MG/ML IJ SOLN
0.5000 mg | INTRAMUSCULAR | Status: DC | PRN
  Administered 2022-10-17 – 2022-10-19 (×6): 0.5 mg via INTRAVENOUS
  Filled 2022-10-17 (×6): qty 0.5

## 2022-10-17 MED ORDER — SODIUM CHLORIDE 0.9 % IV SOLN
1.0000 g | INTRAVENOUS | Status: DC
  Administered 2022-10-18 – 2022-10-19 (×2): 1 g via INTRAVENOUS
  Filled 2022-10-17 (×2): qty 10

## 2022-10-17 MED ORDER — OXYCODONE HCL 5 MG PO TABS
5.0000 mg | ORAL_TABLET | ORAL | Status: DC | PRN
  Administered 2022-10-17 – 2022-10-18 (×4): 5 mg via ORAL
  Filled 2022-10-17 (×4): qty 1

## 2022-10-17 MED ORDER — MORPHINE SULFATE (PF) 4 MG/ML IV SOLN
4.0000 mg | Freq: Once | INTRAVENOUS | Status: AC
  Administered 2022-10-17: 4 mg via INTRAVENOUS
  Filled 2022-10-17: qty 1

## 2022-10-17 MED ORDER — ONDANSETRON HCL 4 MG PO TABS: 4.0000 mg | ORAL_TABLET | Freq: Four times a day (QID) | ORAL | Status: DC | PRN

## 2022-10-17 MED ORDER — ACETAMINOPHEN 325 MG PO TABS
650.0000 mg | ORAL_TABLET | Freq: Four times a day (QID) | ORAL | Status: DC | PRN
  Administered 2022-10-17 – 2022-10-19 (×4): 650 mg via ORAL
  Filled 2022-10-17 (×4): qty 2

## 2022-10-17 MED ORDER — ALBUTEROL SULFATE (2.5 MG/3ML) 0.083% IN NEBU: 2.5000 mg | INHALATION_SOLUTION | RESPIRATORY_TRACT | Status: DC | PRN

## 2022-10-17 MED ORDER — SODIUM CHLORIDE 0.9 % IV SOLN
1000.0000 mL | INTRAVENOUS | Status: DC
  Administered 2022-10-17 – 2022-10-18 (×3): 1000 mL via INTRAVENOUS

## 2022-10-17 MED ORDER — KETOROLAC TROMETHAMINE 15 MG/ML IJ SOLN
15.0000 mg | Freq: Once | INTRAMUSCULAR | Status: AC
  Administered 2022-10-17: 15 mg via INTRAVENOUS
  Filled 2022-10-17: qty 1

## 2022-10-17 MED ORDER — ENOXAPARIN SODIUM 40 MG/0.4ML IJ SOSY
40.0000 mg | PREFILLED_SYRINGE | INTRAMUSCULAR | Status: DC
  Administered 2022-10-17 – 2022-10-19 (×3): 40 mg via SUBCUTANEOUS
  Filled 2022-10-17 (×4): qty 0.4

## 2022-10-17 MED ORDER — SODIUM CHLORIDE 0.9 % IV SOLN
1.0000 g | Freq: Once | INTRAVENOUS | Status: AC
  Administered 2022-10-17: 1 g via INTRAVENOUS
  Filled 2022-10-17: qty 10

## 2022-10-17 MED ORDER — ACETAMINOPHEN 650 MG RE SUPP: 650.0000 mg | Freq: Four times a day (QID) | RECTAL | Status: DC | PRN

## 2022-10-17 MED ORDER — SODIUM CHLORIDE 0.9 % IV BOLUS (SEPSIS)
1000.0000 mL | Freq: Once | INTRAVENOUS | Status: AC
  Administered 2022-10-17: 1000 mL via INTRAVENOUS

## 2022-10-17 MED ORDER — SODIUM CHLORIDE 0.9 % IV SOLN
1000.0000 mL | INTRAVENOUS | Status: DC
  Administered 2022-10-17: 1000 mL via INTRAVENOUS

## 2022-10-17 NOTE — ED Triage Notes (Signed)
Left sided flank pain x 1 week. Hx of spina bifida and intermittently self caths.   Says he  has been having fevers at home up to 103.

## 2022-10-17 NOTE — Plan of Care (Signed)
  Problem: Education: Goal: Knowledge of General Education information will improve Description Including pain rating scale, medication(s)/side effects and non-pharmacologic comfort measures Outcome: Progressing   Problem: Activity: Goal: Risk for activity intolerance will decrease Outcome: Progressing   Problem: Safety: Goal: Ability to remain free from injury will improve Outcome: Progressing

## 2022-10-17 NOTE — ED Notes (Signed)
Patient transported to CT 

## 2022-10-17 NOTE — Plan of Care (Signed)

## 2022-10-17 NOTE — ED Provider Notes (Signed)
[facility] EMERGENCY DEPARTMENT AT [facility] Provider Note  CSN: [id number] Arrival date & time: 10/17/22 7829  Chief Complaint(s) Flank Pain  HPI Riley Simmons is a 27 y.o. male with a past medical history listed below including spina bifida and neurogenic bladder requiring self-catheterization who presents to the emergency department with 1 week of progressively worsening left flank pain now severe.  Similar to prior pyelonephritis.  Patient reports having a fever with a Tmax of 103 earlier today.  Patient has been taken Tylenol Motrin throughout the day.  Endorses nausea and nonbloody nonbilious emesis earlier this afternoon.  No diarrhea.  No other physical complaints.   Flank Pain    Past Medical History Past Medical History:  Diagnosis Date   History of kidney stones    Marijuana smoker, continuous    Scoliosis    Spina bifida [facility])    Urethritis    Patient Active Problem List   Diagnosis Date Noted   Spina bifida (HCC) 09/20/2017   Neurogenic bladder 09/20/2017   Leukocytosis    Pain in right testicle    Epididymitis 08/16/2017   Home Medication(s) Prior to Admission medications   Medication Sig Start Date End Date Taking? Authorizing Provider  cyclobenzaprine (FLEXERIL) 10 MG tablet 1 tab daily as needed Patient taking differently: Take 10 mg by mouth every evening. 11/29/20   Stoioff, Verna Czech, MD  ibuprofen (ADVIL) 800 MG tablet Take 800 mg by mouth 3 (three) times daily. 10/26/20   [provider]  nitrofurantoin, macrocrystal-monohydrate, (MACROBID) 100 MG capsule Take 1 capsule (100 mg total) by mouth 2 (two) times daily. 12/16/20   Stoioff, Verna Czech, MD  oxybutynin (DITROPAN) 5 MG tablet 1 tab tid prn frequency,urgency, bladder spasm 12/24/20   Stoioff, Verna Czech, MD  Probiotic Product (PROBIOTIC DAILY PO) Take 2 capsules by mouth daily.    [provider]  traMADol (ULTRAM) 50 MG tablet Take 1 tablet (50 mg total) by mouth every 6  (six) hours as needed. 12/30/20   Riki Altes, MD                                                                                                                                    Allergies Levofloxacin, Amoxicillin, and Latex  Review of Systems Review of Systems  Genitourinary:  Positive for flank pain.   As noted in HPI  Physical Exam Vital Signs  I have reviewed the triage vital signs BP (!) 134/90 (BP Location: Right Arm)   Pulse 87   Temp 99.1 F (37.3 C)   Resp (!) 22   Ht 5\' 6"  (1.676 m)   Wt 81.6 kg   SpO2 98%   BMI 29.05 kg/m   Physical Exam Vitals reviewed.  Constitutional:      General: He is not in acute distress.    Appearance: He is well-developed. He is not diaphoretic.  HENT:  Head: Normocephalic and atraumatic.     Right Ear: External ear normal.     Left Ear: External ear normal.     Nose: Nose normal.     Mouth/Throat:     Mouth: Mucous membranes are moist.  Eyes:     General: No scleral icterus.    Conjunctiva/sclera: Conjunctivae normal.  Neck:     Trachea: Phonation normal.  Cardiovascular:     Rate and Rhythm: Normal rate and regular rhythm.  Pulmonary:     Effort: Pulmonary effort is normal. No respiratory distress.     Breath sounds: No stridor.  Abdominal:     General: There is no distension.     Tenderness: There is abdominal tenderness in the left upper quadrant and left lower quadrant. There is left CVA tenderness.  Musculoskeletal:        General: Normal range of motion.     Cervical back: Normal range of motion.  Neurological:     Mental Status: He is alert and oriented to person, place, and time.  Psychiatric:        Behavior: Behavior normal.     ED Results and Treatments Labs (all labs ordered are listed, but only abnormal results are displayed) Labs Reviewed  COMPREHENSIVE METABOLIC PANEL - Abnormal; Notable for the following components:      Result Value   Sodium 132 (*)    Glucose, Bld 109 (*)    Calcium  8.3 (*)    AST 14 (*)    All other components within normal limits  CBC WITH DIFFERENTIAL/PLATELET - Abnormal; Notable for the following components:   WBC 17.6 (*)    Neutro Abs 13.9 (*)    Monocytes Absolute 1.9 (*)    All other components within normal limits  URINALYSIS, W/ REFLEX TO CULTURE (INFECTION SUSPECTED) - Abnormal; Notable for the following components:   APPearance CLOUDY (*)    Hgb urine dipstick MODERATE (*)    Protein, ur 30 (*)    Leukocytes,Ua LARGE (*)    Bacteria, UA RARE (*)    All other components within normal limits  I-STAT CHEM 8, ED - Abnormal; Notable for the following components:   Glucose, Bld 107 (*)    Calcium, Ion 1.08 (*)    All other components within normal limits  URINE CULTURE  PROTIME-INR  APTT  I-STAT CG4 LACTIC ACID, ED  I-STAT CG4 LACTIC ACID, ED                                                                                                                         EKG  EKG Interpretation Date/Time:  Saturday October 17 2022 05:07:06 EDT Ventricular Rate:  96 PR Interval:  145 QRS Duration:  87 QT Interval:  329 QTC Calculation: 416 R Axis:   98  Text Interpretation: Sinus rhythm Probable left atrial enlargement Borderline right axis deviation Confirmed by Drema Pry [phone]) on 10/17/2022 5:36:29 AM       Radiology  No results found.  Medications Ordered in ED Medications  sodium chloride 0.9 % bolus 1,000 mL (1,000 mLs Intravenous New Bag/Given 10/17/22 0524)    Followed by  0.9 %  sodium chloride infusion (has no administration in time range)  cefTRIAXone (ROCEPHIN) 1 g in sodium chloride 0.9 % 100 mL IVPB (has no administration in time range)  ketorolac (TORADOL) 15 MG/ML injection 15 mg (has no administration in time range)  morphine (PF) 4 MG/ML injection 4 mg (4 mg Intravenous Given 10/17/22 0524)   Procedures Procedures  (including critical care time) Medical Decision Making / ED Course   Medical Decision  Making Amount and/or Complexity of Data Reviewed Labs: ordered. Decision-making details documented in ED Course. Radiology: ordered. ECG/medicine tests: ordered.  Risk Prescription drug management. Parenteral controlled substances. Decision regarding hospitalization.    Flank pain. Differential considered Urinary tract infection/pyelonephritis, diverticulitis, gastroenteritis, other inflammatory/infectious process.  CBC with leukocytosis.  No anemia. Metabolic panel with mild hyponatremia.  No other significant electrolyte derangements or renal insufficiency.  No evidence of bili obstruction UA is consistent with a urinary tract infection. Lactic acid normal and not consistent with severe sepsis.  Patient was given IV fluids and morphine. IV Rocephin given.  Patient concerned for possible renal stone. CT ordered and pending  Patient care turned over to oncoming provider. Patient case and results discussed in detail; please see their note for further ED managment.         Final Clinical Impression(s) / ED Diagnoses Final diagnoses:  None       This chart was dictated using voice recognition software.  Despite best efforts to proofread,  errors can occur which can change the documentation meaning.    Nira Conn, MD 10/17/22 [phone]

## 2022-10-17 NOTE — H&P (Signed)
History and Physical  JENNINGS MAKRIS VWU:[id number] DOB: October 02, 1995 DOA: 10/17/2022  PCP: Patient, No Pcp Per   Chief Complaint: fever and emesis   HPI: Riley Simmons is a 27 y.o. male with medical history significant for spina bifida, neurogenic bladder and self-catheterization presents with 1 week of left flank pain and nausea as well as 1 day of fever who is being admitted to the hospital with left-sided pyelonephritis.  He has had 1 week of progressively worsening left flank pain, nausea without vomiting.  Did not have any fevers until early yesterday, when he had a Tmax of 103.  Denies any nausea, no blood in the urine.  Thinks his last UTI was about a year ago, previously was following with urology in [place] but has not seen them in quite a while.  Denies any chest pain, cough, shortness of breath.  ED Course: Evaluation this morning in the emergency department significant for WBC 18.  CT scan as noted below shows evidence of left-sided pyelonephritis, and nonobstructing stone.  Review of Systems: Please see HPI for pertinent positives and negatives. A complete 10 system review of systems are otherwise negative.  Past Medical History:  Diagnosis Date   History of kidney stones    Marijuana smoker, continuous    Scoliosis    Spina bifida (HCC)    Urethritis    Past Surgical History:  Procedure Laterality Date   BACK SURGERY     as a baby due to spina bifida    BLADDER SURGERY     CYSTOSCOPY WITH LITHOLAPAXY N/A 12/24/2020   Procedure: CYSTOSCOPY WITH LITHOLAPAXY;  Surgeon: Riki Altes, MD;  Location: [facility] ORS;  Service: Urology;  Laterality: N/A;    Social History:  reports that he has quit smoking. His smoking use included cigarettes. He has never used smokeless tobacco. He reports that he does not currently use alcohol. He reports current drug use. Drug: Marijuana.   Allergies  Allergen Reactions   Levofloxacin Anxiety, Hives and Swelling   Amoxicillin  Hives and Rash   Latex Rash    Family History  Problem Relation Age of Onset   Deafness Mother      Prior to Admission medications   Medication Sig Start Date End Date Taking? Authorizing Provider  acetaminophen (TYLENOL) 500 MG tablet Take 500-1,000 mg by mouth every 6 (six) hours as needed for moderate pain.   Yes [provider]  ibuprofen (ADVIL) 800 MG tablet Take 200-400 mg by mouth 3 (three) times daily as needed for moderate pain or fever. 10/26/20  Yes [provider]    Physical Exam: BP 128/73 (BP Location: Right Arm)   Pulse 79   Temp 98.1 F (36.7 C) (Oral)   Resp 18   Ht 5\' 6"  (1.676 m)   Wt 81.6 kg   SpO2 98%   BMI 29.05 kg/m   General:  Alert, oriented, calm, in no acute distress, seen ambulating to the bathroom on his own Eyes: EOMI, clear conjuctivae, white sclerea Neck: supple, no masses, trachea mildline  Cardiovascular: RRR, no murmurs or rubs, no peripheral edema  Respiratory: clear to auscultation bilaterally, no wheezes, no crackles  Abdomen: soft, nontender, nondistended, normal bowel tones heard; left CVA tenderness Skin: dry, no rashes  Musculoskeletal: no joint effusions, normal range of motion  Psychiatric: appropriate affect, normal speech  Neurologic: extraocular muscles intact, clear speech, moving all extremities with intact sensorium         Labs on Admission:  Basic Metabolic Panel: Recent Labs  Lab 10/17/22 0507 10/17/22 0537  NA 132* 136  K 3.7 4.0  CL 100 102  CO2 23  --   GLUCOSE 109* 107*  BUN 13 13  CREATININE 1.07 1.10  CALCIUM 8.3*  --    Liver Function Tests: Recent Labs  Lab 10/17/22 0507  AST 14*  ALT 13  ALKPHOS 107  BILITOT 0.9  PROT 7.5  ALBUMIN 4.3   No results for input(s): "LIPASE", "AMYLASE" in the last 168 hours. No results for input(s): "AMMONIA" in the last 168 hours. CBC: Recent Labs  Lab 10/17/22 0507 10/17/22 0537  WBC 17.6*  --   NEUTROABS 13.9*  --   HGB 15.1 15.3   HCT 44.5 45.0  MCV 89.7  --   PLT 240  --    Cardiac Enzymes: No results for input(s): "CKTOTAL", "CKMB", "CKMBINDEX", "TROPONINI" in the last 168 hours.  BNP (last 3 results) No results for input(s): "BNP" in the last 8760 hours.  ProBNP (last 3 results) No results for input(s): "PROBNP" in the last 8760 hours.  CBG: No results for input(s): "GLUCAP" in the last 168 hours.  Radiological Exams on Admission: CT Renal Stone Study  Result Date: 10/17/2022 CLINICAL DATA:  Abdominal/flank pain. EXAM: CT ABDOMEN AND PELVIS WITHOUT CONTRAST TECHNIQUE: Multidetector CT imaging of the abdomen and pelvis was performed following the standard protocol without IV contrast. RADIATION DOSE REDUCTION: This exam was performed according to the departmental dose-optimization program which includes automated exposure control, adjustment of the mA and/or kV according to patient size and/or use of iterative reconstruction technique. COMPARISON:  04/01/2019 FINDINGS: Lower chest: No acute abnormality. Asymmetric eventration of the left hemidiaphragm. Hepatobiliary: No focal liver abnormality is seen. No gallstones, gallbladder wall thickening, or biliary dilatation. Pancreas: Unremarkable. No pancreatic ductal dilatation or surrounding inflammatory changes. Spleen: The spleen measures 15.2 cm and cranial caudal dimension. No focal splenic lesion. Adrenals/Urinary Tract: Normal adrenal glands. Normal appearance of the right kidney. There is asymmetric left renal cortical scarring with volume loss from the inferior pole cortex. Large branching stone is identified which extends from the inferior pole collecting system of the left kidney into the left renal pelvis measuring 2.3 cm, image 18/2. There is asymmetric inferior pole scratch set asymmetric caliectasis of the inferior pole and pelviectasis with perinephric soft tissue stranding. Mild left-sided hydroureter with proximal Peri ureteral soft tissue stranding. No  stone identified along the course of the left ureter. Urinary bladder is partially decompressed with mild wall thickening. No bladder calculi. Stomach/Bowel: Stomach appears normal. The appendix is not confidently identified. No pericecal inflammation. No pathologic dilatation of the large or small bowel loops. Vascular/Lymphatic: No signs of abdominopelvic adenopathy. Normal appearance of the abdominal aorta. Reproductive: Prostate is unremarkable. Other: No ascites or focal fluid collections. Musculoskeletal: No acute or significant osseous findings. IMPRESSION: 1. Large branching stone is identified which extends from the inferior pole collecting system of the left kidney into the left renal pelvis measuring 2.3 cm. There is asymmetric inferior pole caliectasis and pelviectasis with perinephric soft tissue stranding. Mild left-sided hydroureter with proximal periureteral soft tissue stranding. No stone identified along the course of the left ureter. 2. Cannot exclude left-sided pyelonephritis. 3. Mild splenomegaly. Electronically Signed   By: Signa Kell M.D.   On: 10/17/2022 07:22    Assessment/Plan Riley Simmons is a 27 y.o. male with medical history significant for spina bifida, neurogenic bladder and self-catheterization presents with 1 week of left  flank pain and nausea as well as 1 day of fever who is being admitted to the hospital with left-sided pyelonephritis.  Pyelonephritis-with flank pain, fever, nausea and CT findings of left-sided stranding.  He is hemodynamically stable and not septic. -Inpatient admission -Regular diet, with gentle IV fluid -Empiric IV Rocephin -Follow urine culture  Leukocytosis-due to pyelonephritis, treating as above  Nephrolithiasis-he has a relatively large stone, but no hydronephrosis.  No clear indication for intervention at this time, could consider inpatient urologic consultation if complications arise.  Otherwise can likely be followed  outpatient  DVT prophylaxis: Lovenox     Code Status: Full Code  Consults called: None  Admission status: The appropriate patient status for this patient is INPATIENT. Inpatient status is judged to be reasonable and necessary in order to provide the required intensity of service to ensure the patient's safety. The patient's presenting symptoms, physical exam findings, and initial radiographic and laboratory data in the context of their chronic comorbidities is felt to place them at high risk for further clinical deterioration. Furthermore, it is not anticipated that the patient will be medically stable for discharge from the hospital within 2 midnights of admission.    I certify that at the point of admission it is my clinical judgment that the patient will require inpatient hospital care spanning beyond 2 midnights from the point of admission due to high intensity of service, high risk for further deterioration and high frequency of surveillance required  Time spent: 55 minutes  Ileanna Gemmill Sharlette Dense MD [facility] Pager [phone]  If 7PM-7AM, please contact night-coverage [email or web address] Password [email or web address]  10/17/2022, 8:30 AM

## 2022-10-17 NOTE — ED Provider Notes (Signed)
Patient seen after prior EDP.  Presentation is consistent with likely pyelonephritis in the setting of a patient with spina bifida and neurogenic bladder.  Patient with reported Tmax at home of 103 and several episodes of emesis prior to arrival.  Workup is consistent with early pyelonephritis.  Patient would benefit from admission for IV antibiotics, IV fluids, additional treatment.  Patient is concerned about going home with current symptoms.  Hospitalist service made aware of case.    Wynetta Fines, MD 10/17/22 [phone]

## 2022-10-18 LAB — BASIC METABOLIC PANEL
Anion gap: 9 (ref 5–15)
BUN: 11 mg/dL (ref 6–20)
CO2: 22 mmol/L (ref 22–32)
Calcium: 7.9 mg/dL — ABNORMAL LOW (ref 8.9–10.3)
Chloride: 104 mmol/L (ref 98–111)
Creatinine, Ser: 0.94 mg/dL (ref 0.61–1.24)
GFR, Estimated: >60 mL/min (ref >60)
Glucose, Bld: 103 mg/dL — ABNORMAL HIGH (ref 70–99)
Potassium: 3.8 mmol/L (ref 3.5–5.1)
Sodium: 135 mmol/L (ref 135–145)

## 2022-10-18 LAB — CBC
HCT: 41 % (ref 39.0–52.0)
Hemoglobin: 13.9 g/dL (ref 13.0–17.0)
MCH: 30.5 pg (ref 26.0–34.0)
MCHC: 33.9 g/dL (ref 30.0–36.0)
MCV: 90.1 fL (ref 80.0–100.0)
Platelets: 209 10*3/uL (ref 150–400)
RBC: 4.55 MIL/uL (ref 4.22–5.81)
RDW: 12.6 % (ref 11.5–15.5)
WBC: 11.3 10*3/uL — ABNORMAL HIGH (ref 4.0–10.5)
nRBC: 0 % (ref 0.0–0.2)

## 2022-10-18 MED ORDER — ORAL CARE MOUTH RINSE: 15.0000 mL | OROMUCOSAL | Status: DC | PRN

## 2022-10-18 NOTE — Progress Notes (Addendum)
PROGRESS NOTE    Riley Simmons  WJX:[id number] DOB: 1995/05/24 DOA: 10/17/2022 PCP: Patient, No Pcp Per  Outpatient Specialists:     Brief Narrative:  Patient is a 27 year old male with history of spina bifida and neurogenic bladder.  Patient self catheterizes.  Patient was admitted with admitted with left sided pyelonephritis.  Patient thinks his last UTI was about a year ago.  Patient was previously following with urology in [place] but has not seen them in quite a while.   10/18/2022: Tmax of 100.2 (99.2), BP is reasonably controlled.  Lab work done earlier today revealed sodium of 135, potassium of 3.8, chloride of 104, CO2 of 22, BUN of 11, serum creatinine of 0.94 and glucose of 130.  WBC is 11.3.  Urine culture is growing E. coli.  Patient is currently on IV ceftriaxone.  No new complaints today.   Assessment & Plan:   Principal Problem:   Pyelonephritis   Pyelonephritis: -Patient presented with flank pain, fever, nausea and CT findings of left-sided stranding.  -Urine culture is growing E. coli. -Follow final culture results. -Continue IV Rocephin for now. -Hydrate patient liberally.    Leukocytosis: -Likely secondary to pyelonephritis. -WBC has improved from 17.6-11.3. -Follow CBC.  Nephrolithiasis: -Asymptomatic. -Large kidney stone.   -Low threshold to consult the urology team (urology team, Dr. Berneice Heinrich, paged).  Discussed with urology team, Dr. Berneice Heinrich, no intervention needed at this time. -Patient may need lithotripsy.  DVT prophylaxis: Subcutaneous Lovenox Code Status: Full code Family Communication:  Disposition Plan: Home eventually   Consultants:  None.  Procedures:  None.  Antimicrobials:  The ceftriaxone   Subjective: No new complaints  Objective: Vitals:   10/17/22 1743 10/17/22 2146 10/18/22 0155 10/18/22 0606  BP: (!) 139/113 (!) 135/98 132/76 (!) 142/99  Pulse: 91 87 80 90  Resp: 16 19 18 19   Temp: 98.2 F (36.8 C) 100.2 F  (37.9 C) 98.6 F (37 C) 99.2 F (37.3 C)  TempSrc: Oral     SpO2: 99% 100% 99% 98%  Weight:      Height:        Intake/Output Summary (Last 24 hours) at 10/18/2022 1100 Last data filed at 10/18/2022 1021 Gross per 24 hour  Intake 1708.62 ml  Output --  Net 1708.62 ml   Filed Weights   10/17/22 0442  Weight: 81.6 kg    Examination:  General exam: Appears calm and comfortable  Respiratory system: Clear to auscultation.  Cardiovascular system: S1 & S2 heard. Gastrointestinal system: Abdomen is soft and nontender.  Central nervous system: Alert and oriented.  Moves all extremities. Extremities: No leg edema.  Data Reviewed: I have personally reviewed following labs and imaging studies  CBC: Recent Labs  Lab 10/17/22 0507 10/17/22 0537 10/18/22 0827  WBC 17.6*  --  11.3*  NEUTROABS 13.9*  --   --   HGB 15.1 15.3 13.9  HCT 44.5 45.0 41.0  MCV 89.7  --  90.1  PLT 240  --  209   Basic Metabolic Panel: Recent Labs  Lab 10/17/22 0507 10/17/22 0537 10/18/22 0827  NA 132* 136 135  K 3.7 4.0 3.8  CL 100 102 104  CO2 23  --  22  GLUCOSE 109* 107* 103*  BUN 13 13 11   CREATININE 1.07 1.10 0.94  CALCIUM 8.3*  --  7.9*   GFR: Estimated Creatinine Clearance: 117.3 mL/min (by C-G formula based on SCr of 0.94 mg/dL). Liver Function Tests: Recent Labs  Lab 10/17/22  0507  AST 14*  ALT 13  ALKPHOS 107  BILITOT 0.9  PROT 7.5  ALBUMIN 4.3   No results for input(s): "LIPASE", "AMYLASE" in the last 168 hours. No results for input(s): "AMMONIA" in the last 168 hours. Coagulation Profile: Recent Labs  Lab 10/17/22 0507  INR 1.1   Cardiac Enzymes: No results for input(s): "CKTOTAL", "CKMB", "CKMBINDEX", "TROPONINI" in the last 168 hours. BNP (last 3 results) No results for input(s): "PROBNP" in the last 8760 hours. HbA1C: No results for input(s): "HGBA1C" in the last 72 hours. CBG: No results for input(s): "GLUCAP" in the last 168 hours. Lipid Profile: No  results for input(s): "CHOL", "HDL", "LDLCALC", "TRIG", "CHOLHDL", "LDLDIRECT" in the last 72 hours. Thyroid Function Tests: No results for input(s): "TSH", "T4TOTAL", "FREET4", "T3FREE", "THYROIDAB" in the last 72 hours. Anemia Panel: No results for input(s): "VITAMINB12", "FOLATE", "FERRITIN", "TIBC", "IRON", "RETICCTPCT" in the last 72 hours. Urine analysis:    Component Value Date/Time   COLORURINE YELLOW 10/17/2022 0507   APPEARANCEUR CLOUDY (A) 10/17/2022 0507   APPEARANCEUR Clear 11/28/2020 1318   LABSPEC 1.015 10/17/2022 0507   PHURINE 6.0 10/17/2022 0507   GLUCOSEU NEGATIVE 10/17/2022 0507   HGBUR MODERATE (A) 10/17/2022 0507   BILIRUBINUR NEGATIVE 10/17/2022 0507   BILIRUBINUR Negative 11/28/2020 1318   KETONESUR NEGATIVE 10/17/2022 0507   PROTEINUR 30 (A) 10/17/2022 0507   UROBILINOGEN 0.2 12/28/2019 0902   NITRITE NEGATIVE 10/17/2022 0507   LEUKOCYTESUR LARGE (A) 10/17/2022 0507   Sepsis Labs: @LABRCNTIP (procalcitonin:4,lacticidven:4)  )No results found for this or any previous visit (from the past 240 hour(s)).       Radiology Studies: CT Renal Stone Study  Result Date: 10/17/2022 CLINICAL DATA:  Abdominal/flank pain. EXAM: CT ABDOMEN AND PELVIS WITHOUT CONTRAST TECHNIQUE: Multidetector CT imaging of the abdomen and pelvis was performed following the standard protocol without IV contrast. RADIATION DOSE REDUCTION: This exam was performed according to the departmental dose-optimization program which includes automated exposure control, adjustment of the mA and/or kV according to patient size and/or use of iterative reconstruction technique. COMPARISON:  04/01/2019 FINDINGS: Lower chest: No acute abnormality. Asymmetric eventration of the left hemidiaphragm. Hepatobiliary: No focal liver abnormality is seen. No gallstones, gallbladder wall thickening, or biliary dilatation. Pancreas: Unremarkable. No pancreatic ductal dilatation or surrounding inflammatory changes.  Spleen: The spleen measures 15.2 cm and cranial caudal dimension. No focal splenic lesion. Adrenals/Urinary Tract: Normal adrenal glands. Normal appearance of the right kidney. There is asymmetric left renal cortical scarring with volume loss from the inferior pole cortex. Large branching stone is identified which extends from the inferior pole collecting system of the left kidney into the left renal pelvis measuring 2.3 cm, image 18/2. There is asymmetric inferior pole scratch set asymmetric caliectasis of the inferior pole and pelviectasis with perinephric soft tissue stranding. Mild left-sided hydroureter with proximal Peri ureteral soft tissue stranding. No stone identified along the course of the left ureter. Urinary bladder is partially decompressed with mild wall thickening. No bladder calculi. Stomach/Bowel: Stomach appears normal. The appendix is not confidently identified. No pericecal inflammation. No pathologic dilatation of the large or small bowel loops. Vascular/Lymphatic: No signs of abdominopelvic adenopathy. Normal appearance of the abdominal aorta. Reproductive: Prostate is unremarkable. Other: No ascites or focal fluid collections. Musculoskeletal: No acute or significant osseous findings. IMPRESSION: 1. Large branching stone is identified which extends from the inferior pole collecting system of the left kidney into the left renal pelvis measuring 2.3 cm. There is asymmetric inferior pole caliectasis  and pelviectasis with perinephric soft tissue stranding. Mild left-sided hydroureter with proximal periureteral soft tissue stranding. No stone identified along the course of the left ureter. 2. Cannot exclude left-sided pyelonephritis. 3. Mild splenomegaly. Electronically Signed   By: Signa Kell M.D.   On: 10/17/2022 07:22        Scheduled Meds:  enoxaparin (LOVENOX) injection  40 mg Subcutaneous Q24H   Continuous Infusions:  sodium chloride 50 mL/hr at 10/18/22 1021   cefTRIAXone  (ROCEPHIN)  IV Stopped (10/18/22 0554)     LOS: 1 day    Time spent: 35 minutes.    Berton Mount, MD  [facility] Pager #: [phone] 7PM-7AM contact night coverage as above

## 2022-10-18 NOTE — Plan of Care (Signed)
  Problem: Education: Goal: Knowledge of General Education information will improve Description: Including pain rating scale, medication(s)/side effects and non-pharmacologic comfort measures Outcome: Progressing   Problem: Clinical Measurements: Goal: Ability to maintain clinical measurements within normal limits will improve Outcome: Progressing   Problem: Nutrition: Goal: Adequate nutrition will be maintained Outcome: Progressing   Problem: Pain Managment: Goal: General experience of comfort will improve Outcome: Progressing   Problem: Safety: Goal: Ability to remain free from injury will improve Outcome: Progressing   

## 2022-10-19 DIAGNOSIS — N12 Tubulo-interstitial nephritis, not specified as acute or chronic: Secondary | ICD-10-CM

## 2022-10-19 LAB — CBC WITH DIFFERENTIAL/PLATELET
Abs Immature Granulocytes: 0.03 10*3/uL (ref 0.00–0.07)
Basophils Absolute: 0.1 10*3/uL (ref 0.0–0.1)
Basophils Relative: 1 %
Eosinophils Absolute: 0.2 10*3/uL (ref 0.0–0.5)
Eosinophils Relative: 2 %
HCT: 42.1 % (ref 39.0–52.0)
Hemoglobin: 13.8 g/dL (ref 13.0–17.0)
Immature Granulocytes: 0 %
Lymphocytes Relative: 24 %
Lymphs Abs: 2 10*3/uL (ref 0.7–4.0)
MCH: 29.4 pg (ref 26.0–34.0)
MCHC: 32.8 g/dL (ref 30.0–36.0)
MCV: 89.6 fL (ref 80.0–100.0)
Monocytes Absolute: 1.2 10*3/uL — ABNORMAL HIGH (ref 0.1–1.0)
Monocytes Relative: 15 %
Neutro Abs: 4.7 10*3/uL (ref 1.7–7.7)
Neutrophils Relative %: 58 %
Platelets: 231 10*3/uL (ref 150–400)
RBC: 4.7 MIL/uL (ref 4.22–5.81)
RDW: 12.3 % (ref 11.5–15.5)
WBC: 8.1 10*3/uL (ref 4.0–10.5)
nRBC: 0 % (ref 0.0–0.2)

## 2022-10-19 LAB — MAGNESIUM: Magnesium: 2.1 mg/dL (ref 1.7–2.4)

## 2022-10-19 LAB — URINE CULTURE: Culture: >=100000 — AB

## 2022-10-19 LAB — RENAL FUNCTION PANEL
Albumin: 3.6 g/dL (ref 3.5–5.0)
Anion gap: 9 (ref 5–15)
BUN: 10 mg/dL (ref 6–20)
CO2: 24 mmol/L (ref 22–32)
Calcium: 8.4 mg/dL — ABNORMAL LOW (ref 8.9–10.3)
Chloride: 101 mmol/L (ref 98–111)
Creatinine, Ser: 0.96 mg/dL (ref 0.61–1.24)
GFR, Estimated: >60 mL/min (ref >60)
Glucose, Bld: 91 mg/dL (ref 70–99)
Phosphorus: 4.5 mg/dL (ref 2.5–4.6)
Potassium: 3.9 mmol/L (ref 3.5–5.1)
Sodium: 134 mmol/L — ABNORMAL LOW (ref 135–145)

## 2022-10-19 MED ORDER — OXYCODONE HCL 5 MG PO TABS: 5.0000 mg | ORAL_TABLET | Freq: Four times a day (QID) | ORAL | 0 refills | Status: DC | PRN

## 2022-10-19 MED ORDER — OXYCODONE HCL 5 MG PO TABS: 5.0000 mg | ORAL_TABLET | ORAL | 0 refills | Status: DC | PRN

## 2022-10-19 MED ORDER — PNEUMOCOCCAL 20-VAL CONJ VACC 0.5 ML IM SUSY: 0.5000 mL | PREFILLED_SYRINGE | INTRAMUSCULAR | Status: DC

## 2022-10-19 MED ORDER — CEFADROXIL 500 MG PO CAPS: 1.0000 g | ORAL_CAPSULE | Freq: Two times a day (BID) | ORAL | 0 refills | Status: DC

## 2022-10-19 NOTE — Plan of Care (Signed)
Patient discharged home via private vehicle with male friend. He is provided with AVS, discharge instructions, and letter for work. To follow up with Urology on outpatient basis. Declines Prevnar. Haydee Salter, RN 10/19/22 11:41 AM

## 2022-10-19 NOTE — Discharge Summary (Signed)
Physician Discharge Summary  [name]:[id number] DOB: 27-Jul-1995 DOA: 10/17/2022  PCP: Riley Simmons, No Pcp Per  Admit date: 10/17/2022 Discharge date: 10/19/2022  Admitted From: Home Disposition:  Home  Discharge Condition:Stable CODE STATUS:FULL Diet recommendation:  Regular   Brief/Interim Summary: Riley Simmons is a 27 year old male with history of spina bifida and neurogenic bladder.  Riley Simmons self catheterizes.  Riley Simmons was admitted with admitted with left sided pyelonephritis.  Riley Simmons thinks his last UTI was about a year ago.  Riley Simmons was previously following with urology in [place] but has not seen them in quite a while.  On presentation he was afebrile.  Lab work showed leukocytosis.  CT abdomen/pelvis showed left-sided renal stone, possible standing.  Urine cultures showed E. coli which is pansensitive.  Case was also discussed with urology here who did not recommend any intervention right now.  Riley Simmons today is hemodynamically stable, afebrile.  Medically stable for discharge home today with oral antibiotics.  We strongly recommend to follow-up with urology as soon as possible.  Following problems were addressed during the hospitalization:  Pyelonephritis: -Riley Simmons presented with flank pain, fever, nausea and CT findings of left-sided stranding.  -Urine culture showed pansensitive E. coli. -Antibiotics changed to oral.  Today he is afebrile  leukocytosis: -Likely secondary to pyelonephritis. -Resolved  Nephrolithiasis: -Asymptomatic. -Large kidney stone seen on the left as per CT.   -Discussed with urology team, Dr. Berneice Heinrich, no intervention needed at this time. -Riley Simmons may need lithotripsy as an outpatient.  Riley Simmons follows with urologist at [facility].  We recommend to follow-up with him as soon as possible.  History of spina bifida/neurogenic bladder: Does intermittent cath at home since the age of 2   Discharge Diagnoses:  Principal Problem:    Pyelonephritis    Discharge Instructions  Discharge Instructions     Diet - low sodium heart healthy   Complete by: As directed    Discharge instructions   Complete by: As directed    1)Please take prescribed medications as instructed 2)Follow up with your urologist in a week.   Increase activity slowly   Complete by: As directed       Allergies as of 10/19/2022       Reactions   Levofloxacin Anxiety, Hives, Swelling   Amoxicillin Hives, Rash   Latex Rash        Medication List     TAKE these medications    acetaminophen 500 MG tablet Commonly known as: TYLENOL Take 500-1,000 mg by mouth every 6 (six) hours as needed for moderate pain.   cefadroxil 500 MG capsule Commonly known as: DURICEF Take 2 capsules (1,000 mg total) by mouth 2 (two) times daily for 5 days.   ibuprofen 800 MG tablet Commonly known as: ADVIL Take 200-400 mg by mouth 3 (three) times daily as needed for moderate pain or fever.   oxyCODONE 5 MG immediate release tablet Commonly known as: Oxy IR/ROXICODONE Take 1 tablet (5 mg total) by mouth every 4 (four) hours as needed for moderate pain.        Follow-up Information     Stoioff, Verna Czech, MD. Schedule an appointment as soon as possible for a visit in 1 week(s).   Specialty: Urology Contact information: [address] [place] [address] [phone]                Allergies  Allergen Reactions   Levofloxacin Anxiety, Hives and Swelling   Amoxicillin Hives and Rash   Latex Rash  Consultations: Case discussed with urology   Procedures/Studies: CT Renal Stone Study  Result Date: 10/17/2022 CLINICAL DATA:  Abdominal/flank pain. EXAM: CT ABDOMEN AND PELVIS WITHOUT CONTRAST TECHNIQUE: Multidetector CT imaging of the abdomen and pelvis was performed following the standard protocol without IV contrast. RADIATION DOSE REDUCTION: This exam was performed according to the departmental dose-optimization  program which includes automated exposure control, adjustment of the mA and/or kV according to Riley Simmons size and/or use of iterative reconstruction technique. COMPARISON:  04/01/2019 FINDINGS: Lower chest: No acute abnormality. Asymmetric eventration of the left hemidiaphragm. Hepatobiliary: No focal liver abnormality is seen. No gallstones, gallbladder wall thickening, or biliary dilatation. Pancreas: Unremarkable. No pancreatic ductal dilatation or surrounding inflammatory changes. Spleen: The spleen measures 15.2 cm and cranial caudal dimension. No focal splenic lesion. Adrenals/Urinary Tract: Normal adrenal glands. Normal appearance of the right kidney. There is asymmetric left renal cortical scarring with volume loss from the inferior pole cortex. Large branching stone is identified which extends from the inferior pole collecting system of the left kidney into the left renal pelvis measuring 2.3 cm, image 18/2. There is asymmetric inferior pole scratch set asymmetric caliectasis of the inferior pole and pelviectasis with perinephric soft tissue stranding. Mild left-sided hydroureter with proximal Peri ureteral soft tissue stranding. No stone identified along the course of the left ureter. Urinary bladder is partially decompressed with mild wall thickening. No bladder calculi. Stomach/Bowel: Stomach appears normal. The appendix is not confidently identified. No pericecal inflammation. No pathologic dilatation of the large or small bowel loops. Vascular/Lymphatic: No signs of abdominopelvic adenopathy. Normal appearance of the abdominal aorta. Reproductive: Prostate is unremarkable. Other: No ascites or focal fluid collections. Musculoskeletal: No acute or significant osseous findings. IMPRESSION: 1. Large branching stone is identified which extends from the inferior pole collecting system of the left kidney into the left renal pelvis measuring 2.3 cm. There is asymmetric inferior pole caliectasis and pelviectasis  with perinephric soft tissue stranding. Mild left-sided hydroureter with proximal periureteral soft tissue stranding. No stone identified along the course of the left ureter. 2. Cannot exclude left-sided pyelonephritis. 3. Mild splenomegaly. Electronically Signed   By: Signa Kell M.D.   On: 10/17/2022 07:22      Subjective: Riley Simmons seen and examined at bedside today.  Hemodynamically stable.  Comfortable, lying in bed.  No fever.  Has some left costovertebral angle tenderness but denies any dysuria.  Long discussion had at the bedside about discharge planning.  Riley Simmons is agreeable for discharge home today with oral antibiotics.  I again discussed with him about the importance of following up with his urologist as an outpatient  Discharge Exam: Vitals:   10/18/22 2106 10/19/22 0617  BP: (!) 141/96 110/74  Pulse: 84 60  Resp: 17 17  Temp: 99.3 F (37.4 C) 98.2 F (36.8 C)  SpO2: 98% 98%   Vitals:   10/18/22 0155 10/18/22 0606 10/18/22 2106 10/19/22 0617  BP: 132/76 (!) 142/99 (!) 141/96 110/74  Pulse: 80 90 84 60  Resp: 18 19 17 17   Temp: 98.6 F (37 C) 99.2 F (37.3 C) 99.3 F (37.4 C) 98.2 F (36.8 C)  TempSrc:   Oral Oral  SpO2: 99% 98% 98% 98%  Weight:      Height:        General: Pt is alert, awake, not in acute distress Cardiovascular: RRR, S1/S2 +, no rubs, no gallops Respiratory: CTA bilaterally, no wheezing, no rhonchi Abdominal: Soft, NT, ND, bowel sounds + Extremities: no edema, no cyanosis  The results of significant diagnostics from this hospitalization (including imaging, microbiology, ancillary and laboratory) are listed below for reference.     Microbiology: Recent Results (from the past 240 hour(s))  Urine Culture     Status: Abnormal   Collection Time: 10/17/22  5:07 AM   Specimen: Urine, Random  Result Value Ref Range Status   Specimen Description   Final    URINE, RANDOM Performed at [facility], [address],  [place], [place] [address]    Special Requests   Final    NONE Reflexed from [id number] Performed at [facility], [address] [place], [place] [address]    Culture >=100,000 COLONIES/mL ESCHERICHIA COLI (A)  Final   Report Status 10/19/2022 FINAL  Final   Organism ID, Bacteria ESCHERICHIA COLI (A)  Final      Susceptibility   Escherichia coli - MIC*    AMPICILLIN >=32 RESISTANT Resistant     CEFAZOLIN <=4 SENSITIVE Sensitive     CEFEPIME <=0.12 SENSITIVE Sensitive     CEFTRIAXONE <=0.25 SENSITIVE Sensitive     CIPROFLOXACIN <=0.25 SENSITIVE Sensitive     GENTAMICIN <=1 SENSITIVE Sensitive     IMIPENEM <=0.25 SENSITIVE Sensitive     NITROFURANTOIN <=16 SENSITIVE Sensitive     TRIMETH/SULFA <=20 SENSITIVE Sensitive     AMPICILLIN/SULBACTAM 16 INTERMEDIATE Intermediate     PIP/TAZO <=4 SENSITIVE Sensitive     * >=100,000 COLONIES/mL ESCHERICHIA COLI     Labs: BNP (last 3 results) No results for input(s): "BNP" in the last 8760 hours. Basic Metabolic Panel: Recent Labs  Lab 10/17/22 0507 10/17/22 0537 10/18/22 0827 10/19/22 0350  NA 132* 136 135 134*  K 3.7 4.0 3.8 3.9  CL 100 102 104 101  CO2 23  --  22 24  GLUCOSE 109* 107* 103* 91  BUN 13 13 11 10   CREATININE 1.07 1.10 0.94 0.96  CALCIUM 8.3*  --  7.9* 8.4*  MG  --   --   --  2.1  PHOS  --   --   --  4.5   Liver Function Tests: Recent Labs  Lab 10/17/22 0507 10/19/22 0350  AST 14*  --   ALT 13  --   ALKPHOS 107  --   BILITOT 0.9  --   PROT 7.5  --   ALBUMIN 4.3 3.6   No results for input(s): "LIPASE", "AMYLASE" in the last 168 hours. No results for input(s): "AMMONIA" in the last 168 hours. CBC: Recent Labs  Lab 10/17/22 0507 10/17/22 0537 10/18/22 0827 10/19/22 0350  WBC 17.6*  --  11.3* 8.1  NEUTROABS 13.9*  --   --  4.7  HGB 15.1 15.3 13.9 13.8  HCT 44.5 45.0 41.0 42.1  MCV 89.7  --  90.1 89.6  PLT 240  --  209 231   Cardiac Enzymes: No results for input(s): "CKTOTAL", "CKMB",  "CKMBINDEX", "TROPONINI" in the last 168 hours. BNP: Invalid input(s): "POCBNP" CBG: No results for input(s): "GLUCAP" in the last 168 hours. D-Dimer No results for input(s): "DDIMER" in the last 72 hours. Hgb A1c No results for input(s): "HGBA1C" in the last 72 hours. Lipid Profile No results for input(s): "CHOL", "HDL", "LDLCALC", "TRIG", "CHOLHDL", "LDLDIRECT" in the last 72 hours. Thyroid function studies No results for input(s): "TSH", "T4TOTAL", "T3FREE", "THYROIDAB" in the last 72 hours.  Invalid input(s): "FREET3" Anemia work up No results for input(s): "VITAMINB12", "FOLATE", "FERRITIN", "TIBC", "IRON", "RETICCTPCT" in the last 72 hours.  Urinalysis    Component Value Date/Time   COLORURINE YELLOW 10/17/2022 0507   APPEARANCEUR CLOUDY (A) 10/17/2022 0507   APPEARANCEUR Clear 11/28/2020 1318   LABSPEC 1.015 10/17/2022 0507   PHURINE 6.0 10/17/2022 0507   GLUCOSEU NEGATIVE 10/17/2022 0507   HGBUR MODERATE (A) 10/17/2022 0507   BILIRUBINUR NEGATIVE 10/17/2022 0507   BILIRUBINUR Negative 11/28/2020 1318   KETONESUR NEGATIVE 10/17/2022 0507   PROTEINUR 30 (A) 10/17/2022 0507   UROBILINOGEN 0.2 12/28/2019 0902   NITRITE NEGATIVE 10/17/2022 0507   LEUKOCYTESUR LARGE (A) 10/17/2022 0507   Sepsis Labs Recent Labs  Lab 10/17/22 0507 10/18/22 0827 10/19/22 0350  WBC 17.6* 11.3* 8.1   Microbiology Recent Results (from the past 240 hour(s))  Urine Culture     Status: Abnormal   Collection Time: 10/17/22  5:07 AM   Specimen: Urine, Random  Result Value Ref Range Status   Specimen Description   Final    URINE, RANDOM Performed at [facility], [address] [place], [place] [address]    Special Requests   Final    NONE Reflexed from [id number] Performed at [facility], [address], [place], [place] [address]    Culture >=100,000 COLONIES/mL ESCHERICHIA COLI (A)  Final   Report Status 10/19/2022 FINAL  Final   Organism ID,  Bacteria ESCHERICHIA COLI (A)  Final      Susceptibility   Escherichia coli - MIC*    AMPICILLIN >=32 RESISTANT Resistant     CEFAZOLIN <=4 SENSITIVE Sensitive     CEFEPIME <=0.12 SENSITIVE Sensitive     CEFTRIAXONE <=0.25 SENSITIVE Sensitive     CIPROFLOXACIN <=0.25 SENSITIVE Sensitive     GENTAMICIN <=1 SENSITIVE Sensitive     IMIPENEM <=0.25 SENSITIVE Sensitive     NITROFURANTOIN <=16 SENSITIVE Sensitive     TRIMETH/SULFA <=20 SENSITIVE Sensitive     AMPICILLIN/SULBACTAM 16 INTERMEDIATE Intermediate     PIP/TAZO <=4 SENSITIVE Sensitive     * >=100,000 COLONIES/mL ESCHERICHIA COLI    Please note: You were cared for by a hospitalist during your hospital stay. Once you are discharged, your primary care physician will handle any further medical issues. Please note that NO REFILLS for any discharge medications will be authorized once you are discharged, as it is imperative that you return to your primary care physician (or establish a relationship with a primary care physician if you do not have one) for your post hospital discharge needs so that they can reassess your need for medications and monitor your lab values.    Time coordinating discharge: 40 minutes  SIGNED:   Burnadette Pop, MD  [facility] 10/19/2022, 10:13 AM Pager [phone]  If 7PM-7AM, please contact night-coverage [email or web address] Password [email or web address]

## 2022-10-19 NOTE — Plan of Care (Signed)
Problem: Education: Goal: Knowledge of General Education information will improve Description: Including pain rating scale, medication(s)/side effects and non-pharmacologic comfort measures Outcome: Progressing   Problem: Clinical Measurements: Goal: Ability to maintain clinical measurements within normal limits will improve Outcome: Progressing   Problem: Activity: Goal: Risk for activity intolerance will decrease Outcome: Progressing   Problem: Elimination: Goal: Will not experience complications related to bowel motility Outcome: Progressing   Problem: Pain Managment: Goal: General experience of comfort will improve Outcome: Progressing   Haydee Salter, RN 10/19/22 9:09 AM

## 2022-10-19 NOTE — Progress Notes (Signed)
Patient has called following discharge and reported that his usual pharmacy is closed. He has identified a pharmacy that is open [facility] sales engineer at [facility]. Dr. Renford Dills is made aware and scripts sent to this [facility] per patient's request.   Reinforced education on the importance of picking up and taking antbx and the full course. He is also instructed to disregard meds from previous pharmacy, including narcotic.  Patient has questions about cost of meds and he is instructed to call [facility] pharmacy directly to discuss the costs.  Haydee Salter, RN 10/19/22 1:30 PM

## 2022-10-21 ENCOUNTER — Telehealth: Payer: Self-pay | Admitting: Physician Assistant

## 2022-10-21 DIAGNOSIS — Z760 Encounter for issue of repeat prescription: Secondary | ICD-10-CM

## 2022-10-21 DIAGNOSIS — M549 Dorsalgia, unspecified: Secondary | ICD-10-CM

## 2022-10-21 DIAGNOSIS — N12 Tubulo-interstitial nephritis, not specified as acute or chronic: Secondary | ICD-10-CM

## 2022-10-21 MED ORDER — CEFADROXIL 500 MG PO CAPS: 1.0000 g | ORAL_CAPSULE | Freq: Two times a day (BID) | ORAL | 0 refills | Status: AC
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
## 2022-10-21 NOTE — Progress Notes (Signed)
Because of recent hospital visit for pyelonephritis and noting continued back pain, I feel your condition warrants further evaluation and I recommend that you be seen in a face to face visit.   NOTE: There will be NO CHARGE for this eVisit   If you are having a true medical emergency please call 911.      For an urgent face to face visit, [facility] has eight urgent care centers for your convenience:   NEW!! [facility] Center at [facility] Get Driving Directions [phone] [address], [address] [place], [address]    [facility] Health Urgent Care Center at [facility] Get Driving Directions [phone] [address] [place], [place] [address]   [facility] Health Urgent Care Center [facility]) Get Driving Directions [phone] [address] [place], [place] [address]  [facility] Health Urgent Care Center [facility]) Get Driving Directions [phone] [address] [place],  [place]  [address]  [facility] Health Urgent Care Center [facility] - at [employer]  [phone] [address] [place],  [place] [address]   [facility] Health Urgent Care at [facility] Get Driving Directions [phone] [address] [place], [place] [address]   [facility] Health Urgent Care at [facility] Get Driving Directions  [phone] [address] [place], [place] [address]   [facility] Health Urgent Care at [facility] Directions [phone] [address], [address] [place], [place] [address]  Your MyChart E-visit questionnaire answers were reviewed by a board certified advanced clinical practitioner to complete your personal care plan based on your specific symptoms.  Thank you for using e-Visits.

## 2022-10-21 NOTE — Patient Instructions (Signed)
  Riley Simmons, thank you for joining [name], PA-C for today's virtual visit.  While this provider is not your primary care provider (PCP), if your PCP is located in our provider database this encounter information will be shared with them immediately following your visit.   A [facility] MyChart account gives you access to today's visit and all your visits, tests, and labs performed at [facility] " click here if you don't have a [facility] MyChart account or go to [email or web address]  Consent: (Patient) Riley Simmons provided verbal consent for this virtual visit at the beginning of the encounter.  Current Medications:  Current Outpatient Medications:    acetaminophen (TYLENOL) 500 MG tablet, Take 500-1,000 mg by mouth every 6 (six) hours as needed for moderate pain., Disp: , Rfl:    cefadroxil (DURICEF) 500 MG capsule, Take 2 capsules (1,000 mg total) by mouth 2 (two) times daily for 5 days., Disp: 20 capsule, Rfl: 0   ibuprofen (ADVIL) 800 MG tablet, Take 200-400 mg by mouth 3 (three) times daily as needed for moderate pain or fever., Disp: , Rfl:    oxyCODONE (OXY IR/ROXICODONE) 5 MG immediate release tablet, Take 1 tablet (5 mg total) by mouth every 6 (six) hours as needed for moderate pain., Disp: 15 tablet, Rfl: 0   Medications ordered in this encounter:  No orders of the defined types were placed in this encounter.    *If you need refills on other medications prior to your next appointment, please contact your pharmacy*  Follow-Up: Call back or seek an in-person evaluation if the symptoms worsen or if the condition fails to improve as anticipated.  [facility] [phone]  Other Instructions I have sent in a replacement medication. If symptoms are not fully resolving by completion of antibiotic, or any new/worsening symptoms despite treatment before you can have your follow-up, you need to return to the ER.    If you  have been instructed to have an in-person evaluation today at a local Urgent Care facility, please use the link below. It will take you to a list of all of our available [facility], including address, phone number and hours of operation. Please do not delay care.  [facility]  If you or a family member do not have a primary care provider, use the link below to schedule a visit and establish care. When you choose a [facility] primary care physician or advanced practice provider, you gain a long-term partner in health. Find a Primary Care Provider  Learn more about [facility]'s in-office and virtual care options: [facility] - Get Care Now

## 2022-12-23 ENCOUNTER — Encounter (HOSPITAL_COMMUNITY): Payer: Self-pay

## 2022-12-23 ENCOUNTER — Emergency Department (HOSPITAL_COMMUNITY): Payer: Self-pay

## 2022-12-23 ENCOUNTER — Inpatient Hospital Stay (HOSPITAL_COMMUNITY)
Admission: EM | Admit: 2022-12-23 | Discharge: 2022-12-27 | DRG: 698 | Disposition: A | Discharge status: Home / Self Care | Payer: Self-pay | Attending: Internal Medicine | Admitting: Internal Medicine

## 2022-12-23 ENCOUNTER — Other Ambulatory Visit: Payer: Self-pay

## 2022-12-23 DIAGNOSIS — N200 Calculus of kidney: Secondary | ICD-10-CM | POA: Diagnosis present

## 2022-12-23 DIAGNOSIS — Y846 Urinary catheterization as the cause of abnormal reaction of the patient, or of later complication, without mention of misadventure at the time of the procedure: Secondary | ICD-10-CM | POA: Diagnosis present

## 2022-12-23 DIAGNOSIS — N4400 Torsion of testis, unspecified: Secondary | ICD-10-CM | POA: Diagnosis present

## 2022-12-23 DIAGNOSIS — K5900 Constipation, unspecified: Secondary | ICD-10-CM | POA: Diagnosis present

## 2022-12-23 DIAGNOSIS — N319 Neuromuscular dysfunction of bladder, unspecified: Secondary | ICD-10-CM | POA: Diagnosis present

## 2022-12-23 DIAGNOSIS — Z880 Allergy status to penicillin: Secondary | ICD-10-CM

## 2022-12-23 DIAGNOSIS — Z91040 Latex allergy status: Secondary | ICD-10-CM

## 2022-12-23 DIAGNOSIS — N390 Urinary tract infection, site not specified: Secondary | ICD-10-CM | POA: Diagnosis present

## 2022-12-23 DIAGNOSIS — L03818 Cellulitis of other sites: Secondary | ICD-10-CM | POA: Diagnosis present

## 2022-12-23 DIAGNOSIS — T83518A Infection and inflammatory reaction due to other urinary catheter, initial encounter: Principal | ICD-10-CM | POA: Diagnosis present

## 2022-12-23 DIAGNOSIS — N433 Hydrocele, unspecified: Secondary | ICD-10-CM | POA: Diagnosis present

## 2022-12-23 DIAGNOSIS — N454 Abscess of epididymis or testis: Secondary | ICD-10-CM | POA: Diagnosis present

## 2022-12-23 DIAGNOSIS — N453 Epididymo-orchitis: Secondary | ICD-10-CM | POA: Diagnosis present

## 2022-12-23 DIAGNOSIS — Z881 Allergy status to other antibiotic agents status: Secondary | ICD-10-CM

## 2022-12-23 DIAGNOSIS — N261 Atrophy of kidney (terminal): Secondary | ICD-10-CM | POA: Diagnosis present

## 2022-12-23 DIAGNOSIS — Q059 Spina bifida, unspecified: Secondary | ICD-10-CM

## 2022-12-23 DIAGNOSIS — A419 Sepsis, unspecified organism: Principal | ICD-10-CM | POA: Diagnosis present

## 2022-12-23 DIAGNOSIS — M419 Scoliosis, unspecified: Secondary | ICD-10-CM | POA: Diagnosis present

## 2022-12-23 DIAGNOSIS — F17210 Nicotine dependence, cigarettes, uncomplicated: Secondary | ICD-10-CM | POA: Diagnosis present

## 2022-12-23 DIAGNOSIS — N452 Orchitis: Secondary | ICD-10-CM | POA: Diagnosis present

## 2022-12-23 LAB — CBC WITH DIFFERENTIAL/PLATELET
Abs Immature Granulocytes: 0.07 10*3/uL (ref 0.00–0.07)
Basophils Absolute: 0.1 10*3/uL (ref 0.0–0.1)
Basophils Relative: 0 %
Eosinophils Absolute: 0.1 10*3/uL (ref 0.0–0.5)
Eosinophils Relative: 0 %
HCT: 43.4 % (ref 39.0–52.0)
Hemoglobin: 14.5 g/dL (ref 13.0–17.0)
Immature Granulocytes: 0 %
Lymphocytes Relative: 8 %
Lymphs Abs: 1.5 10*3/uL (ref 0.7–4.0)
MCH: 30.5 pg (ref 26.0–34.0)
MCHC: 33.4 g/dL (ref 30.0–36.0)
MCV: 91.4 fL (ref 80.0–100.0)
Monocytes Absolute: 1.4 10*3/uL — ABNORMAL HIGH (ref 0.1–1.0)
Monocytes Relative: 8 %
Neutro Abs: 15.1 10*3/uL — ABNORMAL HIGH (ref 1.7–7.7)
Neutrophils Relative %: 84 %
Platelets: 262 10*3/uL (ref 150–400)
RBC: 4.75 MIL/uL (ref 4.22–5.81)
RDW: 13.2 % (ref 11.5–15.5)
WBC: 18.3 10*3/uL — ABNORMAL HIGH (ref 4.0–10.5)
nRBC: 0 % (ref 0.0–0.2)

## 2022-12-23 LAB — RAPID HIV SCREEN (HIV 1/2 AB+AG)
HIV 1/2 Antibodies: NONREACTIVE
HIV-1 P24 Antigen - HIV24: NONREACTIVE

## 2022-12-23 LAB — COMPREHENSIVE METABOLIC PANEL
ALT: 12 U/L (ref 0–44)
AST: 13 U/L — ABNORMAL LOW (ref 15–41)
Albumin: 4.1 g/dL (ref 3.5–5.0)
Alkaline Phosphatase: 82 U/L (ref 38–126)
Anion gap: 7 (ref 5–15)
BUN: 12 mg/dL (ref 6–20)
CO2: 25 mmol/L (ref 22–32)
Calcium: 8.3 mg/dL — ABNORMAL LOW (ref 8.9–10.3)
Chloride: 105 mmol/L (ref 98–111)
Creatinine, Ser: 0.97 mg/dL (ref 0.61–1.24)
GFR, Estimated: >60 mL/min (ref >60)
Glucose, Bld: 93 mg/dL (ref 70–99)
Potassium: 3.9 mmol/L (ref 3.5–5.1)
Sodium: 137 mmol/L (ref 135–145)
Total Bilirubin: 0.9 mg/dL (ref <1.2)
Total Protein: 7.2 g/dL (ref 6.5–8.1)

## 2022-12-23 LAB — URINALYSIS, ROUTINE W REFLEX MICROSCOPIC
Bacteria, UA: NONE SEEN
Bilirubin Urine: NEGATIVE
Glucose, UA: NEGATIVE mg/dL
Ketones, ur: NEGATIVE mg/dL
Nitrite: NEGATIVE
Protein, ur: 30 mg/dL — AB
RBC / HPF: >50 RBC/hpf (ref 0–5)
Specific Gravity, Urine: 1.019 (ref 1.005–1.030)
WBC, UA: >50 WBC/hpf (ref 0–5)
pH: 5 (ref 5.0–8.0)

## 2022-12-23 LAB — LACTIC ACID, PLASMA: Lactic Acid, Venous: 0.7 mmol/L (ref 0.5–1.9)

## 2022-12-23 MED ORDER — ACETAMINOPHEN 325 MG PO TABS
650.0000 mg | ORAL_TABLET | Freq: Four times a day (QID) | ORAL | Status: DC | PRN
  Administered 2022-12-26: 650 mg via ORAL
  Filled 2022-12-23: qty 2

## 2022-12-23 MED ORDER — ENOXAPARIN SODIUM 40 MG/0.4ML IJ SOSY
40.0000 mg | PREFILLED_SYRINGE | INTRAMUSCULAR | Status: DC
  Administered 2022-12-26: 40 mg via SUBCUTANEOUS
  Filled 2022-12-23 (×2): qty 0.4

## 2022-12-23 MED ORDER — ACETAMINOPHEN 650 MG RE SUPP: 650.0000 mg | Freq: Four times a day (QID) | RECTAL | Status: DC | PRN

## 2022-12-23 MED ORDER — LACTATED RINGERS IV BOLUS
30.0000 mL/kg | Freq: Once | INTRAVENOUS | Status: AC
  Administered 2022-12-23: 1914 mL via INTRAVENOUS

## 2022-12-23 MED ORDER — MORPHINE SULFATE (PF) 2 MG/ML IV SOLN
2.0000 mg | INTRAVENOUS | Status: DC | PRN
  Administered 2022-12-24 (×2): 2 mg via INTRAVENOUS
  Filled 2022-12-23 (×2): qty 1

## 2022-12-23 MED ORDER — ONDANSETRON HCL 4 MG PO TABS
4.0000 mg | ORAL_TABLET | Freq: Four times a day (QID) | ORAL | Status: DC | PRN
  Administered 2022-12-24 – 2022-12-25 (×3): 4 mg via ORAL
  Filled 2022-12-23 (×3): qty 1

## 2022-12-23 MED ORDER — SENNOSIDES-DOCUSATE SODIUM 8.6-50 MG PO TABS: 1.0000 | ORAL_TABLET | Freq: Every evening | ORAL | Status: DC | PRN

## 2022-12-23 MED ORDER — ACETAMINOPHEN 500 MG PO TABS
1000.0000 mg | ORAL_TABLET | Freq: Once | ORAL | Status: AC
  Administered 2022-12-23: 1000 mg via ORAL
  Filled 2022-12-23: qty 2

## 2022-12-23 MED ORDER — IOHEXOL 300 MG/ML  SOLN
100.0000 mL | Freq: Once | INTRAMUSCULAR | Status: AC | PRN
Start: 2022-12-23 — End: 2022-12-23
  Administered 2022-12-23: 100 mL via INTRAVENOUS

## 2022-12-23 MED ORDER — HYDROMORPHONE HCL 1 MG/ML IJ SOLN
1.0000 mg | Freq: Once | INTRAMUSCULAR | Status: AC
Start: 2022-12-23 — End: 2022-12-23
  Administered 2022-12-23: 1 mg via INTRAVENOUS
  Filled 2022-12-23: qty 1

## 2022-12-23 MED ORDER — HYDROCODONE-ACETAMINOPHEN 5-325 MG PO TABS
1.0000 | ORAL_TABLET | ORAL | Status: DC | PRN
  Administered 2022-12-24 (×2): 2 via ORAL
  Administered 2022-12-24: 1 via ORAL
  Filled 2022-12-23 (×3): qty 2

## 2022-12-23 MED ORDER — HYDROMORPHONE HCL 1 MG/ML IJ SOLN
1.0000 mg | Freq: Once | INTRAMUSCULAR | Status: AC
  Administered 2022-12-23: 1 mg via INTRAMUSCULAR
  Filled 2022-12-23: qty 1

## 2022-12-23 MED ORDER — ONDANSETRON HCL 4 MG/2ML IJ SOLN: 4.0000 mg | Freq: Four times a day (QID) | INTRAMUSCULAR | Status: DC | PRN

## 2022-12-23 MED ORDER — ONDANSETRON 4 MG PO TBDP
4.0000 mg | ORAL_TABLET | Freq: Once | ORAL | Status: AC
  Administered 2022-12-23: 4 mg via ORAL
  Filled 2022-12-23: qty 1

## 2022-12-23 MED ORDER — SODIUM CHLORIDE 0.9 % IV SOLN
2.0000 g | INTRAVENOUS | Status: DC
  Administered 2022-12-24 – 2022-12-26 (×3): 2 g via INTRAVENOUS
  Filled 2022-12-23 (×3): qty 20

## 2022-12-23 MED ORDER — SODIUM CHLORIDE 0.9 % IV SOLN
2.0000 g | Freq: Once | INTRAVENOUS | Status: AC
  Administered 2022-12-23: 2 g via INTRAVENOUS
  Filled 2022-12-23: qty 20

## 2022-12-23 NOTE — ED Provider Triage Note (Signed)
Emergency Medicine Provider Triage Evaluation Note  Riley Simmons , a 27 y.o. male  was evaluated in triage.  Pt complains of left testicular pain. The patient woke up with this pain this morning. Reports later had swelling and erythema. Does have a h/o spina bifida and self caths. Unsure urinary symptoms because of the self cath. Started to have N/V since being here. Has had torsion before on the left.   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 The patient has left testicular swelling with overlying erythema and significant tenderness. Appears to have a different lay, but has some epididymal inflammation as well. Patient would not sit down for abdominal exam or further examination.   Medical Decision Making  Medically screening exam initiated at 6:21 PM.  Appropriate orders placed.  Riley Simmons was informed that the remainder of the evaluation will be completed by another provider, this initial triage assessment does not replace that evaluation, and the importance of remaining in the ED until their evaluation is complete.  I called [place] and spoke with tech who will come and [place] the patient in Tr4 d/t lack of rooms. Triage nurse aware that the patient is to remain in tr4 for US imaging and there is no room in the main ER. 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 Will order lactic and blood cultures in addition to basic labs. IM dilaudid ordered for pain and Zofran.   Riley Simmons, [place] 12/23/22 1826

## 2022-12-23 NOTE — Progress Notes (Signed)
Elink monitoring for the code sepsis protocol.  

## 2022-12-23 NOTE — Hospital Course (Signed)
Riley Simmons is a 27 y.o. male with medical history significant for spina bifida with neurogenic bladder (self catheterizes), history of kidney stones, history of left testicular torsion who is admitted with sepsis due to UTI/left epididymoorchitis.

## 2022-12-23 NOTE — ED Notes (Signed)
ED TO INPATIENT HANDOFF REPORT  ED Nurse Name and Phone #: Durene Cal RN   S Name/Age/Gender Riley Simmons 27 y.o. male Room/Bed: WA21/WA21  Code Status   Code Status: Full Code  Home/SNF/Other Home Patient oriented to: self, place, time, and situation Is this baseline? Yes   Triage Complete: Triage complete  Chief Complaint Epididymoorchitis [N45.3]  Triage Note Pt reports waking up with left testicular tenderness.  Pt reports swelling noted PTA and tenderness. Hx of left testicular torsion  Pt reports self cath but no urinary s/sy   Allergies Allergies  Allergen Reactions   Levofloxacin Hives, Swelling and Anxiety   Amoxicillin Hives and Rash   Latex Rash    Level of Care/Admitting Diagnosis ED Disposition     ED Disposition  Admit   Condition  --   Comment  Hospital Area: [facility] [[id number]]  Level of Care: Med-Surg [16]  May place patient in observation at [facility] or [facility] if equivalent level of care is available:: No  Covid Evaluation: Asymptomatic - no recent exposure (last 10 days) testing not required  Diagnosis: Epididymoorchitis [161096]  Admitting Physician: Charlsie Quest [[id number]]  Attending Physician: Charlsie Quest [[id number]]          B Medical/Surgery History Past Medical History:  Diagnosis Date   History of kidney stones    Marijuana smoker, continuous    Scoliosis    Spina bifida (HCC)    Urethritis    Past Surgical History:  Procedure Laterality Date   BACK SURGERY     as a baby due to spina bifida    BLADDER SURGERY     CYSTOSCOPY WITH LITHOLAPAXY N/A 12/24/2020   Procedure: CYSTOSCOPY WITH LITHOLAPAXY;  Surgeon: Riki Altes, MD;  Location: [facility] ORS;  Service: Urology;  Laterality: N/A;     A IV Location/Drains/Wounds Patient Lines/Drains/Airways Status     Active Line/Drains/Airways     Name Placement date Placement time Site Days   Peripheral IV 12/23/22 20 G  Anterior;Left;Proximal Forearm 12/23/22  1952  Forearm  less than 1            Intake/Output Last 24 hours No intake or output data in the 24 hours ending 12/23/22 2243  Labs/Imaging Results for orders placed or performed during the hospital encounter of 12/23/22 (from the past 48 hour(s))  CBC with Differential     Status: Abnormal   Collection Time: 12/23/22  7:44 PM  Result Value Ref Range   WBC 18.3 (H) 4.0 - 10.5 K/uL   RBC 4.75 4.22 - 5.81 MIL/uL   Hemoglobin 14.5 13.0 - 17.0 g/dL   HCT 29.5 62.1 - 30.8 %   MCV 91.4 80.0 - 100.0 fL   MCH 30.5 26.0 - 34.0 pg   MCHC 33.4 30.0 - 36.0 g/dL   RDW 65.7 84.6 - 96.2 %   Platelets 262 150 - 400 K/uL   nRBC 0.0 0.0 - 0.2 %   Neutrophils Relative % 84 %   Neutro Abs 15.1 (H) 1.7 - 7.7 K/uL   Lymphocytes Relative 8 %   Lymphs Abs 1.5 0.7 - 4.0 K/uL   Monocytes Relative 8 %   Monocytes Absolute 1.4 (H) 0.1 - 1.0 K/uL   Eosinophils Relative 0 %   Eosinophils Absolute 0.1 0.0 - 0.5 K/uL   Basophils Relative 0 %   Basophils Absolute 0.1 0.0 - 0.1 K/uL   Immature Granulocytes 0 %   Abs Immature Granulocytes 0.07  0.00 - 0.07 K/uL    Comment: Performed at [facility], [address] [place], [place] [address]  Comprehensive metabolic panel     Status: Abnormal   Collection Time: 12/23/22  7:44 PM  Result Value Ref Range   Sodium 137 135 - 145 mmol/L   Potassium 3.9 3.5 - 5.1 mmol/L   Chloride 105 98 - 111 mmol/L   CO2 25 22 - 32 mmol/L   Glucose, Bld 93 70 - 99 mg/dL    Comment: Glucose reference range applies only to samples taken after fasting for at least 8 hours.   BUN 12 6 - 20 mg/dL   Creatinine, Ser 9.14 0.61 - 1.24 mg/dL   Calcium 8.3 (L) 8.9 - 10.3 mg/dL   Total Protein 7.2 6.5 - 8.1 g/dL   Albumin 4.1 3.5 - 5.0 g/dL   AST 13 (L) 15 - 41 U/L   ALT 12 0 - 44 U/L   Alkaline Phosphatase 82 38 - 126 U/L   Total Bilirubin 0.9 <1.2 mg/dL   GFR, Estimated >78 >29 mL/min    Comment:  (NOTE) Calculated using the CKD-EPI Creatinine Equation (2021)    Anion gap 7 5 - 15    Comment: Performed at [facility], [address] [place], [place] [address]  Rapid HIV screen (HIV 1/2 Ab+Ag)     Status: None   Collection Time: 12/23/22  7:44 PM  Result Value Ref Range   HIV-1 P24 Antigen - HIV24 NON REACTIVE NON REACTIVE    Comment: (NOTE) Detection of p24 may be inhibited by biotin in the sample, causing false negative results in acute infection.    HIV 1/2 Antibodies NON REACTIVE NON REACTIVE   Interpretation (HIV Ag Ab)      A non reactive test result means that HIV 1 or HIV 2 antibodies and HIV 1 p24 antigen were not detected in the specimen.    Comment: Performed at [facility], [address] [place], [place] [address]  Lactic acid, plasma     Status: None   Collection Time: 12/23/22  7:44 PM  Result Value Ref Range   Lactic Acid, Venous 0.7 0.5 - 1.9 mmol/L    Comment: Performed at [facility], [address] [place], [place] [address]  Urinalysis, Routine w reflex microscopic -Urine, Catheterized     Status: Abnormal   Collection Time: 12/23/22  7:44 PM  Result Value Ref Range   Color, Urine YELLOW YELLOW   APPearance HAZY (A) CLEAR   Specific Gravity, Urine 1.019 1.005 - 1.030   pH 5.0 5.0 - 8.0   Glucose, UA NEGATIVE NEGATIVE mg/dL   Hgb urine dipstick MODERATE (A) NEGATIVE   Bilirubin Urine NEGATIVE NEGATIVE   Ketones, ur NEGATIVE NEGATIVE mg/dL   Protein, ur 30 (A) NEGATIVE mg/dL   Nitrite NEGATIVE NEGATIVE   Leukocytes,Ua LARGE (A) NEGATIVE   RBC / HPF >50 0 - 5 RBC/hpf   WBC, UA >50 0 - 5 WBC/hpf   Bacteria, UA NONE SEEN NONE SEEN   Squamous Epithelial / HPF 0-5 0 - 5 /HPF   WBC Clumps PRESENT    Mucus PRESENT    Hyaline Casts, UA PRESENT     Comment: Performed at [facility], [address] [place], [place] [address]   CT ABDOMEN PELVIS W CONTRAST  Result Date:  12/23/2022 CLINICAL DATA:  Sepsis, left testicular tenderness and swelling EXAM: CT ABDOMEN AND PELVIS WITH  CONTRAST TECHNIQUE: Multidetector CT imaging of the abdomen and pelvis was performed using the standard protocol following bolus administration of intravenous contrast. RADIATION DOSE REDUCTION: This exam was performed according to the departmental dose-optimization program which includes automated exposure control, adjustment of the mA and/or kV according to patient size and/or use of iterative reconstruction technique. CONTRAST:  OMNIPAQUE IOHEXOL 300 MG/ML  SOLN COMPARISON:  12/23/2022, 10/17/2022 FINDINGS: Lower chest: No acute pleural or parenchymal lung disease. Hepatobiliary: No focal liver abnormality is seen. No gallstones, gallbladder wall thickening, or biliary dilatation. Pancreas: Unremarkable. No pancreatic ductal dilatation or surrounding inflammatory changes. Spleen: Normal in size without focal abnormality. Adrenals/Urinary Tract: Left renal cortical atrophy and scarring again noted. Staghorn type calculus within the lower pole left kidney, measuring up to 2.4 cm in size. No right-sided calculi or obstructive uropathy. The adrenals are unremarkable. Bladder is only minimally distended, with nonspecific anterior bladder wall thickening likely due to under distension. No other filling defects. Stomach/Bowel: No bowel obstruction or ileus. No bowel wall thickening or inflammatory change. Vascular/Lymphatic: No significant vascular findings are present. No enlarged abdominal or pelvic lymph nodes. Reproductive: Prostate is unremarkable. Left-sided hydrocele and prominent vessels consistent with epididymal orchitis as reported on earlier ultrasound of the scrotum. Other: No free fluid or free intraperitoneal gas. No abdominal wall hernia. Musculoskeletal: No acute or destructive bony abnormalities. Reconstructed images demonstrate no additional findings. IMPRESSION: 1. Prominent vessels  within the left hemiscrotum with associated left-sided hydrocele, compatible with the epididymal orchitis reported on earlier scrotal ultrasound. 2. Otherwise no acute intra-abdominal or intrapelvic process. 3. Nonobstructing large left renal calculus, with stable left renal cortical scarring and atrophy. Electronically Signed   By: Sharlet Salina M.D.   On: 12/23/2022 21:24   US SCROTUM W/DOPPLER  Result Date: 12/23/2022 CLINICAL DATA:  Left-sided testicular pain. EXAM: SCROTAL ULTRASOUND DOPPLER ULTRASOUND OF THE TESTICLES TECHNIQUE: Complete ultrasound examination of the testicles, epididymis, and other scrotal structures was performed. Color and spectral Doppler ultrasound were also utilized to evaluate blood flow to the testicles. COMPARISON:  None Available. FINDINGS: Right testicle Measurements: 3.8 x 1.8 x 2.0 cm. The right testicle appears slightly heterogeneous with overall decreased echogenicity. No discrete mass. Left testicle Measurements: 3.9 x 2.0 x 2.8 cm. No mass or microlithiasis visualized. The left testicle is hyperemic. Right epididymis:  Normal in size and appearance. Left epididymis:  The left epididymis is hyperemic. Hydrocele: Small minimally complex left hydrocele, possibly pyocele. Varicocele:  None visualized. Pulsed Doppler interrogation of both testes demonstrates normal low resistance arterial and venous waveforms bilaterally. IMPRESSION: Findings most consistent with left-sided epididymo-orchitis with a small possible pyocele. Clinical correlation is recommended. Electronically Signed   By: Elgie Collard M.D.   On: 12/23/2022 20:33    Pending Labs Unresulted Labs (From admission, onward)     Start     Ordered   12/24/22 0500  Basic metabolic panel  Tomorrow morning,   R        12/23/22 2241   12/24/22 0500  CBC  Tomorrow morning,   R        12/23/22 2241   12/23/22 1827  Urine Culture  Add-on,   AD       Question:  Indication  Answer:  Suprapubic pain   12/23/22  1826   12/23/22 1821  Blood culture (routine x 2)  BLOOD CULTURE X 2,   R (with STAT occurrences)      12/23/22 1820   12/23/22 1820  RPR  Once,   URGENT        12/23/22 1820            Vitals/Pain Today's Vitals   12/23/22 2046 12/23/22 2100 12/23/22 2103 12/23/22 2112  BP:  118/73    Pulse:  89    Resp:  16    Temp:   100 F (37.8 C)   TempSrc:   Oral   SpO2:  98%    Weight:      Height:      PainSc: 9    8     Isolation Precautions No active isolations  Medications Medications  enoxaparin (LOVENOX) injection 40 mg (has no administration in time range)  cefTRIAXone (ROCEPHIN) 2 g in sodium chloride 0.9 % 100 mL IVPB (has no administration in time range)  acetaminophen (TYLENOL) tablet 650 mg (has no administration in time range)    Or  acetaminophen (TYLENOL) suppository 650 mg (has no administration in time range)  HYDROcodone-acetaminophen (NORCO/VICODIN) 5-325 MG per tablet 1-2 tablet (has no administration in time range)  morphine (PF) 2 MG/ML injection 2 mg (has no administration in time range)  senna-docusate (Senokot-S) tablet 1 tablet (has no administration in time range)  ondansetron (ZOFRAN) tablet 4 mg (has no administration in time range)    Or  ondansetron (ZOFRAN) injection 4 mg (has no administration in time range)  ondansetron (ZOFRAN-ODT) disintegrating tablet 4 mg (4 mg Oral Given 12/23/22 1823)  HYDROmorphone (DILAUDID) injection 1 mg (1 mg Intramuscular Given 12/23/22 1823)  acetaminophen (TYLENOL) tablet 1,000 mg (1,000 mg Oral Given 12/23/22 1959)  lactated ringers bolus 1,914 mL (1,914 mLs Intravenous New Bag/Given 12/23/22 2000)  iohexol (OMNIPAQUE) 300 MG/ML solution 100 mL (100 mLs Intravenous Contrast Given 12/23/22 2031)  HYDROmorphone (DILAUDID) injection 1 mg (1 mg Intravenous Given 12/23/22 2046)  cefTRIAXone (ROCEPHIN) 2 g in sodium chloride 0.9 % 100 mL IVPB (0 g Intravenous Stopped 12/23/22 2133)    Mobility walks     Focused  Assessments N/A   R Recommendations: See Admitting Provider Note  Report given to:   Additional Notes: n/a

## 2022-12-23 NOTE — ED Notes (Signed)
Blood cultures sent x2. Minimal blood able to be pulled from blood draws. Pt requesting no further sticks at this time. Blood cultures sent to lab with amounts drawn.

## 2022-12-23 NOTE — ED Provider Notes (Signed)
[place] EMERGENCY DEPARTMENT AT [facility] Provider Note   CSN: [id number] Arrival date & time: 12/23/22  1745     History  Chief Complaint  Patient presents with   Testicle Pain    Riley Simmons is a 27 y.o. male.  With history of spina bifida requiring self-catheterization presenting to the ED for evaluation of left testicle pain.  He states he woke up this morning with some left testicle pain.  He went to work today and then came home and noticed that his left testicle was significantly swollen.  He states his left testicle is typically smaller than the right due to torsion that he suffered approximately 6 years ago.  He reports the pain in the left testicle extends to the left groin.  He is not sexually active and has not been sexually active for the past 6 months.  He denies any abdominal pain or feeling febrile.  He denies any suprapubic pain.   Testicle Pain       Home Medications Prior to Admission medications   Medication Sig Start Date End Date Taking? Authorizing Provider  acetaminophen (TYLENOL) 500 MG tablet Take 500-1,000 mg by mouth every 6 (six) hours as needed for mild pain (pain score 1-3).   Yes [provider]  cefadroxil (DURICEF) 500 MG capsule Take 500 mg by mouth once. 12/23/22 12/24/22 Yes [provider]  Ibuprofen 200 MG CAPS Take 200-400 mg by mouth every 6 (six) hours as needed (for pain or headaches).   Yes [provider]  oxyCODONE (OXY IR/ROXICODONE) 5 MG immediate release tablet Take 1 tablet (5 mg total) by mouth every 6 (six) hours as needed for moderate pain. 10/19/22  Yes Burnadette Pop, MD      Allergies    Levofloxacin, Amoxicillin, and Latex    Review of Systems   Review of Systems  Genitourinary:  Positive for testicular pain.  All other systems reviewed and are negative.   Physical Exam Updated Vital Signs BP 118/73   Pulse 89   Temp 100 F (37.8 C) (Oral)   Resp 16   Ht 5\' 6"  (1.676  m)   Wt 81.6 kg   SpO2 98%   BMI 29.05 kg/m  Physical Exam Vitals and nursing note reviewed. Exam conducted with a chaperone present Durene Cal, Charity fundraiser).  Constitutional:      General: He is not in acute distress.    Appearance: He is well-developed.  HENT:     Head: Normocephalic and atraumatic.  Eyes:     Conjunctiva/sclera: Conjunctivae normal.  Cardiovascular:     Rate and Rhythm: Normal rate and regular rhythm.     Heart sounds: No murmur heard. Pulmonary:     Effort: Pulmonary effort is normal. No respiratory distress.     Breath sounds: Normal breath sounds.  Abdominal:     Palpations: Abdomen is soft.     Tenderness: There is no abdominal tenderness.  Genitourinary:    Comments: Left testicle significantly swollen when compared to the right.  Normal cremasteric reflex bilaterally.  No penile discharge.  TTP significantly to the inferior pole of the left testicle.  No inguinal lymphadenopathy or hernia. No erythema or necrosis of perineum. Musculoskeletal:        General: No swelling.     Cervical back: Neck supple.  Skin:    General: Skin is warm and dry.     Capillary Refill: Capillary refill takes less than 2 seconds.  Neurological:  Mental Status: He is alert.  Psychiatric:        Mood and Affect: Mood normal.     ED Results / Procedures / Treatments   Labs (all labs ordered are listed, but only abnormal results are displayed) Labs Reviewed  CBC WITH DIFFERENTIAL/PLATELET - Abnormal; Notable for the following components:      Result Value   WBC 18.3 (*)    Neutro Abs 15.1 (*)    Monocytes Absolute 1.4 (*)    All other components within normal limits  COMPREHENSIVE METABOLIC PANEL - Abnormal; Notable for the following components:   Calcium 8.3 (*)    AST 13 (*)    All other components within normal limits  URINALYSIS, ROUTINE W REFLEX MICROSCOPIC - Abnormal; Notable for the following components:   APPearance HAZY (*)    Hgb urine dipstick MODERATE (*)     Protein, ur 30 (*)    Leukocytes,Ua LARGE (*)    All other components within normal limits  CULTURE, BLOOD (ROUTINE X 2)  CULTURE, BLOOD (ROUTINE X 2)  URINE CULTURE  RAPID HIV SCREEN (HIV 1/2 AB+AG)  LACTIC ACID, PLASMA  RPR  GC/CHLAMYDIA PROBE AMP ([place]) NOT AT [facility]    EKG None  Radiology CT ABDOMEN PELVIS W CONTRAST  Result Date: 12/23/2022 CLINICAL DATA:  Sepsis, left testicular tenderness and swelling EXAM: CT ABDOMEN AND PELVIS WITH CONTRAST TECHNIQUE: Multidetector CT imaging of the abdomen and pelvis was performed using the standard protocol following bolus administration of intravenous contrast. RADIATION DOSE REDUCTION: This exam was performed according to the departmental dose-optimization program which includes automated exposure control, adjustment of the mA and/or kV according to patient size and/or use of iterative reconstruction technique. CONTRAST:  OMNIPAQUE IOHEXOL 300 MG/ML  SOLN COMPARISON:  12/23/2022, 10/17/2022 FINDINGS: Lower chest: No acute pleural or parenchymal lung disease. Hepatobiliary: No focal liver abnormality is seen. No gallstones, gallbladder wall thickening, or biliary dilatation. Pancreas: Unremarkable. No pancreatic ductal dilatation or surrounding inflammatory changes. Spleen: Normal in size without focal abnormality. Adrenals/Urinary Tract: Left renal cortical atrophy and scarring again noted. Staghorn type calculus within the lower pole left kidney, measuring up to 2.4 cm in size. No right-sided calculi or obstructive uropathy. The adrenals are unremarkable. Bladder is only minimally distended, with nonspecific anterior bladder wall thickening likely due to under distension. No other filling defects. Stomach/Bowel: No bowel obstruction or ileus. No bowel wall thickening or inflammatory change. Vascular/Lymphatic: No significant vascular findings are present. No enlarged abdominal or pelvic lymph nodes. Reproductive: Prostate is unremarkable.  Left-sided hydrocele and prominent vessels consistent with epididymal orchitis as reported on earlier ultrasound of the scrotum. Other: No free fluid or free intraperitoneal gas. No abdominal wall hernia. Musculoskeletal: No acute or destructive bony abnormalities. Reconstructed images demonstrate no additional findings. IMPRESSION: 1. Prominent vessels within the left hemiscrotum with associated left-sided hydrocele, compatible with the epididymal orchitis reported on earlier scrotal ultrasound. 2. Otherwise no acute intra-abdominal or intrapelvic process. 3. Nonobstructing large left renal calculus, with stable left renal cortical scarring and atrophy. Electronically Signed   By: Sharlet Salina M.D.   On: 12/23/2022 21:24   US SCROTUM W/DOPPLER  Result Date: 12/23/2022 CLINICAL DATA:  Left-sided testicular pain. EXAM: SCROTAL ULTRASOUND DOPPLER ULTRASOUND OF THE TESTICLES TECHNIQUE: Complete ultrasound examination of the testicles, epididymis, and other scrotal structures was performed. Color and spectral Doppler ultrasound were also utilized to evaluate blood flow to the testicles. COMPARISON:  None Available. FINDINGS: Right testicle Measurements: 3.8 x  1.8 x 2.0 cm. The right testicle appears slightly heterogeneous with overall decreased echogenicity. No discrete mass. Left testicle Measurements: 3.9 x 2.0 x 2.8 cm. No mass or microlithiasis visualized. The left testicle is hyperemic. Right epididymis:  Normal in size and appearance. Left epididymis:  The left epididymis is hyperemic. Hydrocele: Small minimally complex left hydrocele, possibly pyocele. Varicocele:  None visualized. Pulsed Doppler interrogation of both testes demonstrates normal low resistance arterial and venous waveforms bilaterally. IMPRESSION: Findings most consistent with left-sided epididymo-orchitis with a small possible pyocele. Clinical correlation is recommended. Electronically Signed   By: Elgie Collard M.D.   On: 12/23/2022  20:33    Procedures .Critical Care  Performed by: Michelle Piper, PA-C Authorized by: Michelle Piper, PA-C   Critical care provider statement:    Critical care time (minutes):  34   Critical care was necessary to treat or prevent imminent or life-threatening deterioration of the following conditions:  Sepsis   Critical care was time spent personally by me on the following activities:  Development of treatment plan with patient or surrogate, discussions with consultants, evaluation of patient's response to treatment, examination of patient, ordering and review of laboratory studies, ordering and review of radiographic studies, ordering and performing treatments and interventions, pulse oximetry, re-evaluation of patient's condition and review of old charts   Care discussed with: admitting provider   Comments:     Urosepsis     Medications Ordered in ED Medications  ondansetron (ZOFRAN-ODT) disintegrating tablet 4 mg (4 mg Oral Given 12/23/22 1823)  HYDROmorphone (DILAUDID) injection 1 mg (1 mg Intramuscular Given 12/23/22 1823)  acetaminophen (TYLENOL) tablet 1,000 mg (1,000 mg Oral Given 12/23/22 1959)  lactated ringers bolus 1,914 mL (1,914 mLs Intravenous New Bag/Given 12/23/22 2000)  iohexol (OMNIPAQUE) 300 MG/ML solution 100 mL (100 mLs Intravenous Contrast Given 12/23/22 2031)  HYDROmorphone (DILAUDID) injection 1 mg (1 mg Intravenous Given 12/23/22 2046)  cefTRIAXone (ROCEPHIN) 2 g in sodium chloride 0.9 % 100 mL IVPB (0 g Intravenous Stopped 12/23/22 2133)    ED Course/ Medical Decision Making/ A&P Clinical Course as of 12/23/22 2220  Wed Dec 23, 2022  2218 Urosepsis, pyeloneprhitis, left sided orchitis with pyocele. Hospitalist requests urology consult. [CP]    Clinical Course User Index [CP] Olene Floss, PA-C                                 Medical Decision Making Amount and/or Complexity of Data Reviewed Radiology: ordered.  Risk OTC  drugs. Prescription drug management. Decision regarding hospitalization.  This patient presents to the ED for concern of left testicle pain, this involves an extensive number of treatment options, and is a complaint that carries with it a high risk of complications and morbidity. The differential diagnosis of Emergent testicle pain includes but is not limited to Cellulitis, Fournier's gangrene, epididymitis, orchitis, testicular torsion, appendage torsion, trauma, indirect hernia.   My initial workup includes sepsis labs, imaging  Additional history obtained from: Nursing notes from this visit.  I ordered, reviewed and interpreted labs which include: Sepsis labs..  Urine with moderate hemoglobin, large leukocytes, greater than 50 red blood cells and white blood cells, white blood cell clumps  I ordered imaging studies including ultrasound scrotum, CT abdomen pelvis I independently visualized and interpreted imaging which showed epididymal orchitis to the left testicle with possible pyocele.  CT abdomen pelvis otherwise negative I agree with the radiologist interpretation  Cardiac Monitoring:  The patient was maintained on a cardiac monitor.  I personally viewed and interpreted the cardiac monitored which showed an underlying rhythm of: Sinus tachycardia  Consultations Obtained:  I requested consultation with the hospitalist Dr. Allena Katz,  and discussed lab and imaging findings as well as pertinent plan - they recommend: Consult with urology, admission  Consulted with urology Dr. Jennette Bill.  He recommends: charted in ED course  Initially febrile and tachycardic.  Otherwise hemodynamically stable.  27 year old male presenting to the ED for evaluation of left testicle pain and swelling.  Has a history of spina bifida and neurogenic bladder requiring self-catheterization.  He does report intermittently not performing self cleansing like he is supposed to.  On exam, the left testicle is large and  there is erythema of the scrotum.  There is tenderness to palpation of the inferior and posterior aspects of the left testicle.  Right testicle is within normal limits.  Ultrasound concerning for epididymal orchitis.  Labs concerning for urosepsis.  He was initiated on IV antibiotics.  Believe patient would benefit from admission for continued IV antibiotics given that he meets SIRS criteria.  Patient is in agreement with this plan.  Stable at the time of admission.  Patient's case discussed with Dr. Rush Landmark who agrees with plan to admit.   Note: Portions of this report may have been transcribed using voice recognition software. Every effort was made to ensure accuracy; however, inadvertent computerized transcription errors may still be present.        Final Clinical Impression(s) / ED Diagnoses Final diagnoses:  Sepsis without acute organ dysfunction, due to unspecified organism [facility])  Epididymo-orchitis    Rx / DC Orders ED Discharge Orders     None         Michelle Piper, Cordelia Poche 12/23/22 2220    Tegeler, Canary Brim, MD 12/23/22 2340

## 2022-12-23 NOTE — ED Triage Notes (Signed)
Pt reports waking up with left testicular tenderness.  Pt reports swelling noted PTA and tenderness. Hx of left testicular torsion  Pt reports self cath but no urinary s/sy

## 2022-12-23 NOTE — H&P (Signed)
History and Physical    Riley Simmons:[id number] DOB: 02-Jun-1995 DOA: 12/23/2022  PCP: Patient, No Pcp Per  Patient coming from: Home  I have personally briefly reviewed patient's old medical records in [facility]  Chief Complaint: Left testicular pain and swelling  HPI: Riley Simmons is a 27 y.o. male with medical history significant for spina bifida with neurogenic bladder (self catheterizes), history of kidney stones, history of left testicular torsion who presented to the ED for evaluation of left testicular pain and swelling.  Patient states he woke up this morning with some left testicular pain.  He went to work today and when he returned home he noticed persistent pain and that his left testicle was significantly swollen.  He reported that his left testicle is typically smaller than the right due to a history of torsion.  He reported a fever today.  He has not had any urethral discharge.  Denies dysuria.  He has had some left lower groin pain.  He reports good urine output.  Denies any injury to the area.  ED Course  Labs/Imaging on admission: I have personally reviewed following labs and imaging studies.  Initial vitals showed BP 135/89, pulse 109, RR 18, temp 100.9 F, SpO2 97% on room air.  Tmax 102.2 F rectally.  Labs show WBC 18.3, hemoglobin 14.5, platelets 262,000, sodium 137, potassium 3.9, bicarb 25, BUN 12, creatinine 0.97, serum glucose 93, lactic acid 0.7.  Urinalysis showed negative nitrites, large leukocytes, >50 RBCs and WBCs, no bacteria.  Rapid HIV screen nonreactive.  RPR, GC/chlamydia, blood cultures collected and pending.  Scrotal ultrasound consistent with left-sided epididymoorchitis with a small possible pyocele.  CT abdomen/pelvis with contrast showed prominent vessels within the left hemiscrotum with associated left-sided hydrocele, compatible with the epididymal orchitis seen on earlier scrotal ultrasound.  Otherwise no acute  intra-abdominal or intrapelvic process.  Nonobstructing large left renal calculus with stable left renal cortical scarring and atrophy noted.  Patient was given 1900 mL LR, IV Dilaudid, Zofran, IV ceftriaxone.  The hospitalist service was consulted to admit for further evaluation and management.  Review of Systems: All systems reviewed and are negative except as documented in history of present illness above.   Past Medical History:  Diagnosis Date   History of kidney stones    Marijuana smoker, continuous    Scoliosis    Spina bifida (HCC)    Urethritis     Past Surgical History:  Procedure Laterality Date   BACK SURGERY     as a baby due to spina bifida    BLADDER SURGERY     CYSTOSCOPY WITH LITHOLAPAXY N/A 12/24/2020   Procedure: CYSTOSCOPY WITH LITHOLAPAXY;  Surgeon: Riki Altes, MD;  Location: [facility] ORS;  Service: Urology;  Laterality: N/A;    Social History:  reports that he has quit smoking. His smoking use included cigarettes. He has never used smokeless tobacco. He reports that he does not currently use alcohol. He reports current drug use. Drug: Marijuana.  Allergies  Allergen Reactions   Levofloxacin Hives, Swelling and Anxiety   Amoxicillin Hives and Rash   Latex Rash    Family History  Problem Relation Age of Onset   Deafness Mother      Prior to Admission medications   Medication Sig Start Date End Date Taking? Authorizing Provider  acetaminophen (TYLENOL) 500 MG tablet Take 500-1,000 mg by mouth every 6 (six) hours as needed for moderate pain.    [provider]  ibuprofen (ADVIL) 800 MG tablet Take 200-400 mg by mouth 3 (three) times daily as needed for moderate pain or fever. 10/26/20   [provider]  oxyCODONE (OXY IR/ROXICODONE) 5 MG immediate release tablet Take 1 tablet (5 mg total) by mouth every 6 (six) hours as needed for moderate pain. 10/19/22   Burnadette Pop, MD    Physical Exam: Vitals:   12/23/22 2000 12/23/22 2100  12/23/22 2103 12/23/22 2130  BP: (!) 117/93 118/73  103/82  Pulse: 92 89  77  Resp: 18 16  16   Temp:   100 F (37.8 C)   TempSrc:   Oral   SpO2: 100% 98%  96%  Weight:      Height:       Constitutional: Sitting up in bed, NAD, calm, comfortable Eyes: EOMI, lids and conjunctivae normal ENMT: Mucous membranes are moist. Posterior pharynx clear of any exudate or lesions.Normal dentition.  Neck: normal, supple, no masses. Respiratory: clear to auscultation bilaterally, no wheezing, no crackles. Normal respiratory effort. No accessory muscle use.  Cardiovascular: Regular rate and rhythm, no murmurs / rubs / gallops. No extremity edema. 2+ pedal pulses. Abdomen: no tenderness, no masses palpated.  GU: Swelling erythema, and tenderness of the left testes.  No urethral/penile discharge. Musculoskeletal: no clubbing / cyanosis. No joint deformity upper and lower extremities. Good ROM, no contractures. Normal muscle tone.  Skin: Swelling and erythema of left testes Neurologic: Sensation intact. Strength 5/5 in all 4.  Psychiatric: Normal judgment and insight. Alert and oriented x 3. Normal mood.   EKG: Not performed.  Assessment/Plan Principal Problem:   Sepsis secondary to UTI [facility]) Active Problems:   Epididymoorchitis   Neurogenic bladder   Riley Simmons is a 27 y.o. male with medical history significant for spina bifida with neurogenic bladder (self catheterizes), history of kidney stones, history of left testicular torsion who is admitted with sepsis due to UTI/left epididymoorchitis.  Assessment and Plan: Sepsis due to UTI/left epididymoorchitis: Met sepsis criteria on admission with leukocytosis, fever and infectious source of UTI/left epididymoorchitis. -Continue IV ceftriaxone -Follow blood and urine cultures, RPR, GC/chlamydia  Neurogenic bladder: Patient self catheterizes.   DVT prophylaxis: enoxaparin (LOVENOX) injection 40 mg Start: 12/24/22 2200 Code Status: Full  code Family Communication: Sister at bedside Disposition Plan: From home, dispo pending clinical progress Consults called: None Severity of Illness: The appropriate patient status for this patient is OBSERVATION. Observation status is judged to be reasonable and necessary in order to provide the required intensity of service to ensure the patient's safety. The patient's presenting symptoms, physical exam findings, and initial radiographic and laboratory data in the context of their medical condition is felt to place them at decreased risk for further clinical deterioration. Furthermore, it is anticipated that the patient will be medically stable for discharge from the hospital within 2 midnights of admission.   Darreld Mclean MD [facility]  If 7PM-7AM, please contact night-coverage [email or web address]  12/23/2022, 11:41 PM

## 2022-12-24 DIAGNOSIS — N452 Orchitis: Secondary | ICD-10-CM | POA: Diagnosis present

## 2022-12-24 LAB — BASIC METABOLIC PANEL
Anion gap: 9 (ref 5–15)
BUN: 10 mg/dL (ref 6–20)
CO2: 22 mmol/L (ref 22–32)
Calcium: 8 mg/dL — ABNORMAL LOW (ref 8.9–10.3)
Chloride: 106 mmol/L (ref 98–111)
Creatinine, Ser: 0.92 mg/dL (ref 0.61–1.24)
GFR, Estimated: >60 mL/min (ref >60)
Glucose, Bld: 112 mg/dL — ABNORMAL HIGH (ref 70–99)
Potassium: 3.8 mmol/L (ref 3.5–5.1)
Sodium: 137 mmol/L (ref 135–145)

## 2022-12-24 LAB — CBC
HCT: 41.1 % (ref 39.0–52.0)
Hemoglobin: 13.7 g/dL (ref 13.0–17.0)
MCH: 30.3 pg (ref 26.0–34.0)
MCHC: 33.3 g/dL (ref 30.0–36.0)
MCV: 90.9 fL (ref 80.0–100.0)
Platelets: 262 10*3/uL (ref 150–400)
RBC: 4.52 MIL/uL (ref 4.22–5.81)
RDW: 13.2 % (ref 11.5–15.5)
WBC: 20.5 10*3/uL — ABNORMAL HIGH (ref 4.0–10.5)
nRBC: 0 % (ref 0.0–0.2)

## 2022-12-24 LAB — URINE CULTURE: Culture: <10000 — AB

## 2022-12-24 LAB — RPR: RPR Ser Ql: NONREACTIVE

## 2022-12-24 MED ORDER — OXYCODONE HCL 5 MG PO TABS
10.0000 mg | ORAL_TABLET | ORAL | Status: DC | PRN
  Administered 2022-12-24 – 2022-12-27 (×6): 10 mg via ORAL
  Filled 2022-12-24 (×6): qty 2

## 2022-12-24 MED ORDER — DOXYCYCLINE HYCLATE 100 MG IV SOLR: 100.0000 mg | Freq: Two times a day (BID) | INTRAVENOUS | Status: DC

## 2022-12-24 MED ORDER — DOXYCYCLINE HYCLATE 100 MG PO TABS
100.0000 mg | ORAL_TABLET | Freq: Two times a day (BID) | ORAL | Status: DC
  Administered 2022-12-24 – 2022-12-27 (×7): 100 mg via ORAL
  Filled 2022-12-24 (×7): qty 1

## 2022-12-24 MED ORDER — HYDROMORPHONE HCL 1 MG/ML IJ SOLN
0.5000 mg | INTRAMUSCULAR | Status: DC | PRN
  Administered 2022-12-24 – 2022-12-26 (×7): 0.5 mg via INTRAVENOUS
  Filled 2022-12-24 (×7): qty 0.5

## 2022-12-24 NOTE — Plan of Care (Signed)
  Problem: Coping: Goal: Level of anxiety will decrease Outcome: Progressing   Problem: Pain Management: Goal: General experience of comfort will improve Outcome: Progressing   Problem: Safety: Goal: Ability to remain free from injury will improve Outcome: Progressing

## 2022-12-24 NOTE — Consult Note (Signed)
I have been asked to see the patient by Dr. Darreld Mclean, for evaluation fournier's rule out   History of present illness: 27 year old male with a history of spina bifida and previous testicular torsion on the left side presented for fevers and left testicular pain and swelling.  Stated that it started this morning and steadily gotten worse over the course of the day.  He denies any medial thigh pain, denies any perineal pain.  Denies any flank pain.  Was febrile when he presented.   Past medical history includes spina bifida, left testicular torsion.  He uses depends often and does not see a urologist.  He also has a left large renal pelvis stone with a slightly atrophic left kidney.  CT shows no hydronephrosis of the left kidney patient has no flank pain.   Review of systems: A 12 point comprehensive review of systems was obtained and is negative unless otherwise stated in the history of present illness.  Patient Active Problem List   Diagnosis Date Noted   Epididymoorchitis 12/23/2022   Sepsis secondary to UTI [facility]) 12/23/2022   Pyelonephritis 10/17/2022   Spina bifida (HCC) 09/20/2017   Neurogenic bladder 09/20/2017   Leukocytosis    Pain in right testicle    Epididymitis 08/16/2017    No current facility-administered medications on file prior to encounter.   Current Outpatient Medications on File Prior to Encounter  Medication Sig Dispense Refill   acetaminophen (TYLENOL) 500 MG tablet Take 500-1,000 mg by mouth every 6 (six) hours as needed for mild pain (pain score 1-3).     cefadroxil (DURICEF) 500 MG capsule Take 500 mg by mouth once.     Ibuprofen 200 MG CAPS Take 200-400 mg by mouth every 6 (six) hours as needed (for pain or headaches).     oxyCODONE (OXY IR/ROXICODONE) 5 MG immediate release tablet Take 1 tablet (5 mg total) by mouth every 6 (six) hours as needed for moderate pain. 15 tablet 0    Past Medical History:  Diagnosis Date   History of kidney stones     Marijuana smoker, continuous    Scoliosis    Spina bifida (HCC)    Urethritis     Past Surgical History:  Procedure Laterality Date   BACK SURGERY     as a baby due to spina bifida    BLADDER SURGERY     CYSTOSCOPY WITH LITHOLAPAXY N/A 12/24/2020   Procedure: CYSTOSCOPY WITH LITHOLAPAXY;  Surgeon: Riki Altes, MD;  Location: [facility] ORS;  Service: Urology;  Laterality: N/A;    Social History   Tobacco Use   Smoking status: Former    Current packs/day: 0.50    Types: Cigarettes   Smokeless tobacco: Never  Vaping Use   Vaping status: Never Used  Substance Use Topics   Alcohol use: Not Currently   Drug use: Yes    Types: Marijuana    Comment: daily    Family History  Problem Relation Age of Onset   Deafness Mother     PE: Vitals:   12/23/22 2100 12/23/22 2103 12/23/22 2130 12/24/22 0024  BP: 118/73  103/82 131/83  Pulse: 89  77 85  Resp: 16  16 16   Temp:  100 F (37.8 C)  98.4 F (36.9 C)  TempSrc:  Oral  Oral  SpO2: 98%  96% 96%  Weight:      Height:       Patient appears to be in no acute distress  patient is alert and  oriented x3 Atraumatic normocephalic head No cervical or supraclavicular lymphadenopathy appreciated No increased work of breathing, no audible wheezes/rhonchi Regular sinus rhythm/rate Abdomen is soft, nontender, nondistended, no CVA or suprapubic tenderness GU exam: Normal penis pain meatus no tenderness to the penis or shaft, no skin irregularities.  No tenderness to the suprapubic area.  There is left-sided hydrocele which is very tender.  There is no crepitus fluctuance noted within the scrotum.  There is significant erythema over the left hemiscrotum.  There is no tenderness to the perineum no tenderness to the medial thighs.  No fluctuance crepitus or induration in this area either.  Recent Labs    12/23/22 1944  WBC 18.3*  HGB 14.5  HCT 43.4   Recent Labs    12/23/22 1944  NA 137  K 3.9  CL 105  CO2 25  GLUCOSE 93  BUN  12  CREATININE 0.97  CALCIUM 8.3*   No results for input(s): "LABPT", "INR" in the last 72 hours. No results for input(s): "LABURIN" in the last 72 hours. Results for orders placed or performed during the hospital encounter of 10/17/22  Urine Culture     Status: Abnormal   Collection Time: 10/17/22  5:07 AM   Specimen: Urine, Random  Result Value Ref Range Status   Specimen Description   Final    URINE, RANDOM Performed at [facility], [address], [place], [place] [address]    Special Requests   Final    NONE Reflexed from [id number] Performed at [facility], [address], [place], [place] [address]    Culture >=100,000 COLONIES/mL ESCHERICHIA COLI (A)  Final   Report Status 10/19/2022 FINAL  Final   Organism ID, Bacteria ESCHERICHIA COLI (A)  Final      Susceptibility   Escherichia coli - MIC*    AMPICILLIN >=32 RESISTANT Resistant     CEFAZOLIN <=4 SENSITIVE Sensitive     CEFEPIME <=0.12 SENSITIVE Sensitive     CEFTRIAXONE <=0.25 SENSITIVE Sensitive     CIPROFLOXACIN <=0.25 SENSITIVE Sensitive     GENTAMICIN <=1 SENSITIVE Sensitive     IMIPENEM <=0.25 SENSITIVE Sensitive     NITROFURANTOIN <=16 SENSITIVE Sensitive     TRIMETH/SULFA <=20 SENSITIVE Sensitive     AMPICILLIN/SULBACTAM 16 INTERMEDIATE Intermediate     PIP/TAZO <=4 SENSITIVE Sensitive     * >=100,000 COLONIES/mL ESCHERICHIA COLI    Imaging:   CT IMPRESSION: 1. Prominent vessels within the left hemiscrotum with associated left-sided hydrocele, compatible with the epididymal orchitis reported on earlier scrotal ultrasound. 2. Otherwise no acute intra-abdominal or intrapelvic process. 3. Nonobstructing large left renal calculus, with stable left renal cortical scarring and atrophy.  US IMPRESSION: Findings most consistent with left-sided epididymo-orchitis with a small possible pyocele. Clinical correlation is recommended.   none  Imp: 27 year old male with left  epididymoorchitis and reactive hydrocele no evidence of Fournier's on exam or imaging.  Patient also has spina bifida with likely some component of neurogenic bladder and left-sided renal stone.  Recommendations: No urologic surgical intervention warranted Continue broad-spectrum antibiotics Follow-up urine culture tailor antibiotics to urine culture Recommend daily exams and consult urology if exam changes or patient acutely worsens Urology will set up follow-up for left-sided renal stone management and spina bifida with component of neurogenic bladder evaluation.   Thank you for involving me in this patient's care, I will continue to follow along.Please page with any further questions or concerns. Riley Simmons

## 2022-12-24 NOTE — Progress Notes (Signed)
PROGRESS NOTE    Riley Simmons  ZOX:[id number] DOB: 14-Oct-1995 DOA: 12/23/2022 PCP: Patient, No Pcp Per    Brief Narrative:  27 year old with history of spina bifid a and neurogenic bladder self catheterizes, history of kidney stones, history of left testicular torsion treated conservatively presented with 1 day of severe left testicular pain, redness and swelling.  In the emergency room temperature 100.9.  On room air.  Otherwise hemodynamically stable.  Urinalysis negative with large leukocytes.  Rapid HIV negative.  CT scan abdomen pelvis with contrast with intact blood vessels left hemiscrotum, left-sided hydrocele, epididymal orchitis.  Due to significant findings patient was admitted to the hospital.  Subjective: Seen in the morning rounds.  Afebrile.  Complains of moderate pain.  Not very keen to conversation.  Seen by urology.  Recommended to broaden antibiotic coverage. Assessment & Plan:   Left epididymoorchitis with spreading cellulitis and pyocele suspected. Currently on Rocephin, will broaden antibiotics.  Add doxycycline.  Adequate pain medications.  Mobility.  Scrotal support.  Adequate oral IV opiates for pain relief.  Urology continues to follow-up.  Anticipating conservative management.  Neurogenic bladder: Patient self catheterizes.  He can continue that.   DVT prophylaxis: enoxaparin (LOVENOX) injection 40 mg Start: 12/24/22 2200   Code Status: Full code Family Communication: None at the bedside Disposition Plan: Status is: Inpatient Remains inpatient appropriate because: Significant infection, IV antibiotics     Consultants:  urology  Procedures:  None  Antimicrobials:  Rocephin 11/6--- Doxycycline 11/7---     Objective: Vitals:   12/23/22 2103 12/23/22 2130 12/24/22 0024 12/24/22 0456  BP:  103/82 131/83 113/74  Pulse:  77 85 81  Resp:  16 16 15   Temp: 100 F (37.8 C)  98.4 F (36.9 C) 98.4 F (36.9 C)  TempSrc: Oral  Oral Oral  SpO2:   96% 96% 97%  Weight:      Height:        Intake/Output Summary (Last 24 hours) at 12/24/2022 1313 Last data filed at 12/24/2022 0600 Gross per 24 hour  Intake 600 ml  Output --  Net 600 ml   Filed Weights   12/23/22 1801  Weight: 81.6 kg    Examination:  General exam: Appears calm and comfortable  Respiratory system: Clear to auscultation. Respiratory effort normal. Cardiovascular system: S1 & S2 heard, RRR.  Gastrointestinal system:  Soft.  Nontender.  Bowel sound present. Enlarged left testicle, tender to touch.  Erythema over the scrotum mostly on the left side.  No fluctuance, crepitus surrounding. Central nervous system: Alert and oriented. No focal neurological deficits.    Data Reviewed: I have personally reviewed following labs and imaging studies  CBC: Recent Labs  Lab 12/23/22 1944 12/24/22 0339  WBC 18.3* 20.5*  NEUTROABS 15.1*  --   HGB 14.5 13.7  HCT 43.4 41.1  MCV 91.4 90.9  PLT 262 262   Basic Metabolic Panel: Recent Labs  Lab 12/23/22 1944 12/24/22 0339  NA 137 137  K 3.9 3.8  CL 105 106  CO2 25 22  GLUCOSE 93 112*  BUN 12 10  CREATININE 0.97 0.92  CALCIUM 8.3* 8.0*   GFR: Estimated Creatinine Clearance: 119.9 mL/min (by C-G formula based on SCr of 0.92 mg/dL). Liver Function Tests: Recent Labs  Lab 12/23/22 1944  AST 13*  ALT 12  ALKPHOS 82  BILITOT 0.9  PROT 7.2  ALBUMIN 4.1   No results for input(s): "LIPASE", "AMYLASE" in the last 168 hours. No results for input(s): "  AMMONIA" in the last 168 hours. Coagulation Profile: No results for input(s): "INR", "PROTIME" in the last 168 hours. Cardiac Enzymes: No results for input(s): "CKTOTAL", "CKMB", "CKMBINDEX", "TROPONINI" in the last 168 hours. BNP (last 3 results) No results for input(s): "PROBNP" in the last 8760 hours. HbA1C: No results for input(s): "HGBA1C" in the last 72 hours. CBG: No results for input(s): "GLUCAP" in the last 168 hours. Lipid Profile: No results  for input(s): "CHOL", "HDL", "LDLCALC", "TRIG", "CHOLHDL", "LDLDIRECT" in the last 72 hours. Thyroid Function Tests: No results for input(s): "TSH", "T4TOTAL", "FREET4", "T3FREE", "THYROIDAB" in the last 72 hours. Anemia Panel: No results for input(s): "VITAMINB12", "FOLATE", "FERRITIN", "TIBC", "IRON", "RETICCTPCT" in the last 72 hours. Sepsis Labs: Recent Labs  Lab 12/23/22 1944  LATICACIDVEN 0.7    Recent Results (from the past 240 hour(s))  Blood culture (routine x 2)     Status: None (Preliminary result)   Collection Time: 12/23/22  7:45 PM   Specimen: BLOOD  Result Value Ref Range Status   Specimen Description   Final    BLOOD RIGHT ANTECUBITAL Performed at [facility], [address] [place], [place] [address]    Special Requests   Final    BOTTLES DRAWN AEROBIC AND ANAEROBIC Blood Culture results may not be optimal due to an inadequate volume of blood received in culture bottles Performed at [facility], [address], [place], [place] [address]    Culture   Final    NO GROWTH < 12 HOURS Performed at [facility], [address], [place], [place] [address]    Report Status PENDING  Incomplete  Blood culture (routine x 2)     Status: None (Preliminary result)   Collection Time: 12/23/22  7:45 PM   Specimen: BLOOD  Result Value Ref Range Status   Specimen Description   Final    BLOOD BLOOD LEFT FOREARM Performed at [facility], [address] [place], [place] [address]    Special Requests   Final    BOTTLES DRAWN AEROBIC AND ANAEROBIC Blood Culture results may not be optimal due to an inadequate volume of blood received in culture bottles Performed at [facility], [address], [place], [place] [address]    Culture   Final    NO GROWTH < 12 HOURS Performed at [facility], [address], [place], [place] [address]    Report Status PENDING  Incomplete          Radiology Studies: CT ABDOMEN PELVIS W CONTRAST  Result Date: 12/23/2022 CLINICAL DATA:  Sepsis, left testicular tenderness and swelling EXAM: CT ABDOMEN AND PELVIS WITH CONTRAST TECHNIQUE: Multidetector CT imaging of the abdomen and pelvis was performed using the standard protocol following bolus administration of intravenous contrast. RADIATION DOSE REDUCTION: This exam was performed according to the departmental dose-optimization program which includes automated exposure control, adjustment of the mA and/or kV according to patient size and/or use of iterative reconstruction technique. CONTRAST:  OMNIPAQUE IOHEXOL 300 MG/ML  SOLN COMPARISON:  12/23/2022, 10/17/2022 FINDINGS: Lower chest: No acute pleural or parenchymal lung disease. Hepatobiliary: No focal liver abnormality is seen. No gallstones, gallbladder wall thickening, or biliary dilatation. Pancreas: Unremarkable. No pancreatic ductal dilatation or surrounding inflammatory changes. Spleen: Normal in size without focal abnormality. Adrenals/Urinary Tract: Left renal cortical atrophy and scarring again noted. Staghorn type calculus within the lower pole left kidney, measuring up to 2.4 cm in size. No right-sided  calculi or obstructive uropathy. The adrenals are unremarkable. Bladder is only minimally distended, with nonspecific anterior bladder wall thickening likely due to under distension. No other filling defects. Stomach/Bowel: No bowel obstruction or ileus. No bowel wall thickening or inflammatory change. Vascular/Lymphatic: No significant vascular findings are present. No enlarged abdominal or pelvic lymph nodes. Reproductive: Prostate is unremarkable. Left-sided hydrocele and prominent vessels consistent with epididymal orchitis as reported on earlier ultrasound of the scrotum. Other: No free fluid or free intraperitoneal gas. No abdominal wall hernia. Musculoskeletal: No acute or destructive bony abnormalities. Reconstructed  images demonstrate no additional findings. IMPRESSION: 1. Prominent vessels within the left hemiscrotum with associated left-sided hydrocele, compatible with the epididymal orchitis reported on earlier scrotal ultrasound. 2. Otherwise no acute intra-abdominal or intrapelvic process. 3. Nonobstructing large left renal calculus, with stable left renal cortical scarring and atrophy. Electronically Signed   By: Sharlet Salina M.D.   On: 12/23/2022 21:24   US SCROTUM W/DOPPLER  Result Date: 12/23/2022 CLINICAL DATA:  Left-sided testicular pain. EXAM: SCROTAL ULTRASOUND DOPPLER ULTRASOUND OF THE TESTICLES TECHNIQUE: Complete ultrasound examination of the testicles, epididymis, and other scrotal structures was performed. Color and spectral Doppler ultrasound were also utilized to evaluate blood flow to the testicles. COMPARISON:  None Available. FINDINGS: Right testicle Measurements: 3.8 x 1.8 x 2.0 cm. The right testicle appears slightly heterogeneous with overall decreased echogenicity. No discrete mass. Left testicle Measurements: 3.9 x 2.0 x 2.8 cm. No mass or microlithiasis visualized. The left testicle is hyperemic. Right epididymis:  Normal in size and appearance. Left epididymis:  The left epididymis is hyperemic. Hydrocele: Small minimally complex left hydrocele, possibly pyocele. Varicocele:  None visualized. Pulsed Doppler interrogation of both testes demonstrates normal low resistance arterial and venous waveforms bilaterally. IMPRESSION: Findings most consistent with left-sided epididymo-orchitis with a small possible pyocele. Clinical correlation is recommended. Electronically Signed   By: Elgie Collard M.D.   On: 12/23/2022 20:33        Scheduled Meds:  doxycycline  100 mg Oral Q12H   enoxaparin (LOVENOX) injection  40 mg Subcutaneous Q24H   Continuous Infusions:  cefTRIAXone (ROCEPHIN)  IV       LOS: 0 days    Time spent: 35 minutes    Dorcas Carrow, MD [facility]

## 2022-12-25 LAB — CBC WITH DIFFERENTIAL/PLATELET
Abs Immature Granulocytes: 0.11 10*3/uL — ABNORMAL HIGH (ref 0.00–0.07)
Basophils Absolute: 0.1 10*3/uL (ref 0.0–0.1)
Basophils Relative: 0 %
Eosinophils Absolute: 0.1 10*3/uL (ref 0.0–0.5)
Eosinophils Relative: 0 %
HCT: 40.3 % (ref 39.0–52.0)
Hemoglobin: 13.3 g/dL (ref 13.0–17.0)
Immature Granulocytes: 1 %
Lymphocytes Relative: 12 %
Lymphs Abs: 2.2 10*3/uL (ref 0.7–4.0)
MCH: 30.2 pg (ref 26.0–34.0)
MCHC: 33 g/dL (ref 30.0–36.0)
MCV: 91.6 fL (ref 80.0–100.0)
Monocytes Absolute: 1.2 10*3/uL — ABNORMAL HIGH (ref 0.1–1.0)
Monocytes Relative: 7 %
Neutro Abs: 14.7 10*3/uL — ABNORMAL HIGH (ref 1.7–7.7)
Neutrophils Relative %: 80 %
Platelets: 247 10*3/uL (ref 150–400)
RBC: 4.4 MIL/uL (ref 4.22–5.81)
RDW: 13 % (ref 11.5–15.5)
WBC: 18.2 10*3/uL — ABNORMAL HIGH (ref 4.0–10.5)
nRBC: 0 % (ref 0.0–0.2)

## 2022-12-25 LAB — GC/CHLAMYDIA PROBE AMP (~~LOC~~) NOT AT ARMC
Chlamydia: NEGATIVE
Comment: NEGATIVE
Comment: NORMAL
Neisseria Gonorrhea: NEGATIVE

## 2022-12-25 NOTE — Progress Notes (Signed)
PROGRESS NOTE    Riley Simmons  WUJ:[id number] DOB: 1995/04/30 DOA: 12/23/2022 PCP: Patient, No Pcp Per    Brief Narrative:  27 year old with history of spina bifida and neurogenic bladder that self catheterizes, history of kidney stones, history of left testicular torsion treated conservatively presented with 1 day of severe left testicular pain, redness and swelling.  In the emergency room temperature 100.9.  On room air.  Otherwise hemodynamically stable.  Urinalysis negative with large leukocytes.  Rapid HIV negative.  CT scan abdomen pelvis with contrast with intact blood vessels left hemiscrotum, left-sided hydrocele, epididymal orchitis.  Due to significant findings patient was admitted to the hospital.  Subjective: Patient seen and examined.  He feels like the left scrotum is more tight.  Pain is slightly better.  Afebrile overnight.  Appropriately anxious.  Assessment & Plan:   Left epididymoorchitis with spreading cellulitis and pyocele suspected. Currently on Rocephin and doxycycline.  Scrotal support.  Adequate pain medications.  Mobility.  Adequate oral IV opiates for pain relief.  Urology continues to follow-up.  Anticipating conservative management.  Neurogenic bladder: Patient self catheterizes.  He can continue that.   DVT prophylaxis: enoxaparin (LOVENOX) injection 40 mg Start: 12/24/22 2200   Code Status: Full code Family Communication: None at the bedside Disposition Plan: Status is: Inpatient Remains inpatient appropriate because: Significant infection, IV antibiotics     Consultants:  urology  Procedures:  None  Antimicrobials:  Rocephin 11/6--- Doxycycline 11/7---     Objective: Vitals:   12/24/22 1402 12/24/22 1755 12/24/22 2057 12/25/22 0502  BP: 112/72 119/82 133/82 (!) 136/100  Pulse:  95 92 85  Resp:  16 17 16   Temp: 99 F (37.2 C) 98.7 F (37.1 C) 99.8 F (37.7 C) 99 F (37.2 C)  TempSrc: Oral  Oral Oral  SpO2: 99% 96% 98% 99%   Weight:      Height:        Intake/Output Summary (Last 24 hours) at 12/25/2022 1128 Last data filed at 12/25/2022 0601 Gross per 24 hour  Intake 440 ml  Output --  Net 440 ml   Filed Weights   12/23/22 1801  Weight: 81.6 kg    Examination:  General exam: Appears calm and comfortable  Respiratory system: Clear to auscultation. Respiratory effort normal. Cardiovascular system: S1 & S2 heard, RRR.  Gastrointestinal system:  Soft.  Nontender.  Bowel sound present. Enlarged left testicle, tender to touch. Redness over the scrotum mostly on the left side.  No fluctuance or surrounding crepitus.  Central nervous system: Alert and oriented. No focal neurological deficits.    Data Reviewed: I have personally reviewed following labs and imaging studies  CBC: Recent Labs  Lab 12/23/22 1944 12/24/22 0339 12/25/22 0343  WBC 18.3* 20.5* 18.2*  NEUTROABS 15.1*  --  14.7*  HGB 14.5 13.7 13.3  HCT 43.4 41.1 40.3  MCV 91.4 90.9 91.6  PLT 262 262 247   Basic Metabolic Panel: Recent Labs  Lab 12/23/22 1944 12/24/22 0339  NA 137 137  K 3.9 3.8  CL 105 106  CO2 25 22  GLUCOSE 93 112*  BUN 12 10  CREATININE 0.97 0.92  CALCIUM 8.3* 8.0*   GFR: Estimated Creatinine Clearance: 119.9 mL/min (by C-G formula based on SCr of 0.92 mg/dL). Liver Function Tests: Recent Labs  Lab 12/23/22 1944  AST 13*  ALT 12  ALKPHOS 82  BILITOT 0.9  PROT 7.2  ALBUMIN 4.1   No results for input(s): "LIPASE", "AMYLASE" in the  last 168 hours. No results for input(s): "AMMONIA" in the last 168 hours. Coagulation Profile: No results for input(s): "INR", "PROTIME" in the last 168 hours. Cardiac Enzymes: No results for input(s): "CKTOTAL", "CKMB", "CKMBINDEX", "TROPONINI" in the last 168 hours. BNP (last 3 results) No results for input(s): "PROBNP" in the last 8760 hours. HbA1C: No results for input(s): "HGBA1C" in the last 72 hours. CBG: No results for input(s): "GLUCAP" in the last 168  hours. Lipid Profile: No results for input(s): "CHOL", "HDL", "LDLCALC", "TRIG", "CHOLHDL", "LDLDIRECT" in the last 72 hours. Thyroid Function Tests: No results for input(s): "TSH", "T4TOTAL", "FREET4", "T3FREE", "THYROIDAB" in the last 72 hours. Anemia Panel: No results for input(s): "VITAMINB12", "FOLATE", "FERRITIN", "TIBC", "IRON", "RETICCTPCT" in the last 72 hours. Sepsis Labs: Recent Labs  Lab 12/23/22 1944  LATICACIDVEN 0.7    Recent Results (from the past 240 hour(s))  Urine Culture     Status: Abnormal   Collection Time: 12/23/22  7:44 PM   Specimen: Urine, Clean Catch  Result Value Ref Range Status   Specimen Description   Final    URINE, CLEAN CATCH Performed at [facility], [address], [place], [place] [address]    Special Requests   Final    NONE Performed at [facility], [address] [place], [place] [address]    Culture (A)  Final    <10,000 COLONIES/mL INSIGNIFICANT GROWTH Performed at [facility], [address] [place], [place] [address]    Report Status 12/24/2022 FINAL  Final  Blood culture (routine x 2)     Status: None (Preliminary result)   Collection Time: 12/23/22  7:45 PM   Specimen: BLOOD  Result Value Ref Range Status   Specimen Description   Final    BLOOD RIGHT ANTECUBITAL Performed at [facility], [address] [place], [place] [address]    Special Requests   Final    BOTTLES DRAWN AEROBIC AND ANAEROBIC Blood Culture results may not be optimal due to an inadequate volume of blood received in culture bottles Performed at [facility], [address], [place], [place] [address]    Culture   Final    NO GROWTH 2 DAYS Performed at [facility], [address], [place], [place] [address]    Report Status PENDING  Incomplete  Blood culture (routine x 2)     Status: None (Preliminary result)   Collection Time: 12/23/22  7:45 PM   Specimen:  BLOOD LEFT FOREARM  Result Value Ref Range Status   Specimen Description   Final    BLOOD LEFT FOREARM Performed at [facility], [address], [place], [place] [address]    Special Requests   Final    BOTTLES DRAWN AEROBIC AND ANAEROBIC Blood Culture results may not be optimal due to an inadequate volume of blood received in culture bottles Performed at [facility], [address], [place], [place] [address]    Culture   Final    NO GROWTH 2 DAYS Performed at [facility], [address] [place], [place] [address]    Report Status PENDING  Incomplete         Radiology Studies: CT ABDOMEN PELVIS W CONTRAST  Result Date: 12/23/2022 CLINICAL DATA:  Sepsis, left testicular tenderness and swelling EXAM: CT ABDOMEN AND PELVIS WITH CONTRAST TECHNIQUE: Multidetector CT imaging of the abdomen and pelvis was performed using the standard protocol following  bolus administration of intravenous contrast. RADIATION DOSE REDUCTION: This exam was performed according to the departmental dose-optimization program which includes automated exposure control, adjustment of the mA and/or kV according to patient size and/or use of iterative reconstruction technique. CONTRAST:  OMNIPAQUE IOHEXOL 300 MG/ML  SOLN COMPARISON:  12/23/2022, 10/17/2022 FINDINGS: Lower chest: No acute pleural or parenchymal lung disease. Hepatobiliary: No focal liver abnormality is seen. No gallstones, gallbladder wall thickening, or biliary dilatation. Pancreas: Unremarkable. No pancreatic ductal dilatation or surrounding inflammatory changes. Spleen: Normal in size without focal abnormality. Adrenals/Urinary Tract: Left renal cortical atrophy and scarring again noted. Staghorn type calculus within the lower pole left kidney, measuring up to 2.4 cm in size. No right-sided calculi or obstructive uropathy. The adrenals are unremarkable. Bladder is only minimally distended, with nonspecific anterior  bladder wall thickening likely due to under distension. No other filling defects. Stomach/Bowel: No bowel obstruction or ileus. No bowel wall thickening or inflammatory change. Vascular/Lymphatic: No significant vascular findings are present. No enlarged abdominal or pelvic lymph nodes. Reproductive: Prostate is unremarkable. Left-sided hydrocele and prominent vessels consistent with epididymal orchitis as reported on earlier ultrasound of the scrotum. Other: No free fluid or free intraperitoneal gas. No abdominal wall hernia. Musculoskeletal: No acute or destructive bony abnormalities. Reconstructed images demonstrate no additional findings. IMPRESSION: 1. Prominent vessels within the left hemiscrotum with associated left-sided hydrocele, compatible with the epididymal orchitis reported on earlier scrotal ultrasound. 2. Otherwise no acute intra-abdominal or intrapelvic process. 3. Nonobstructing large left renal calculus, with stable left renal cortical scarring and atrophy. Electronically Signed   By: Sharlet Salina M.D.   On: 12/23/2022 21:24   US SCROTUM W/DOPPLER  Result Date: 12/23/2022 CLINICAL DATA:  Left-sided testicular pain. EXAM: SCROTAL ULTRASOUND DOPPLER ULTRASOUND OF THE TESTICLES TECHNIQUE: Complete ultrasound examination of the testicles, epididymis, and other scrotal structures was performed. Color and spectral Doppler ultrasound were also utilized to evaluate blood flow to the testicles. COMPARISON:  None Available. FINDINGS: Right testicle Measurements: 3.8 x 1.8 x 2.0 cm. The right testicle appears slightly heterogeneous with overall decreased echogenicity. No discrete mass. Left testicle Measurements: 3.9 x 2.0 x 2.8 cm. No mass or microlithiasis visualized. The left testicle is hyperemic. Right epididymis:  Normal in size and appearance. Left epididymis:  The left epididymis is hyperemic. Hydrocele: Small minimally complex left hydrocele, possibly pyocele. Varicocele:  None visualized.  Pulsed Doppler interrogation of both testes demonstrates normal low resistance arterial and venous waveforms bilaterally. IMPRESSION: Findings most consistent with left-sided epididymo-orchitis with a small possible pyocele. Clinical correlation is recommended. Electronically Signed   By: Elgie Collard M.D.   On: 12/23/2022 20:33        Scheduled Meds:  doxycycline  100 mg Oral Q12H   enoxaparin (LOVENOX) injection  40 mg Subcutaneous Q24H   Continuous Infusions:  cefTRIAXone (ROCEPHIN)  IV 2 g (12/24/22 2154)     LOS: 1 day    Time spent: 35 minutes    Dorcas Carrow, MD [facility]

## 2022-12-25 NOTE — Progress Notes (Signed)
Subjective: Little sleep overnight. Pt feels scrotum is more swollen and and harder, though pain may be less.   Objective: Vital signs in last 24 hours: Temp:  [98.7 F (37.1 C)-99.8 F (37.7 C)] 99 F (37.2 C) (11/08 0502) Pulse Rate:  [85-95] 85 (11/08 0502) Resp:  [16-17] 16 (11/08 0502) BP: (112-136)/(72-100) 136/100 (11/08 0502) SpO2:  [96 %-99 %] 99 % (11/08 0502)  Assessment/Plan: #epididimoorchitis Broad spectrum ABX. Culture pending. NGTD on BC, insignificant growth on BC. Left testicle is still erythematous and exquisitely tender to touch. Continue IV ABX for the time being. Can transition to oral Bactrim or the like on discharge. Follow up established. Urology will follow along peripherally. Please call with questions.    Intake/Output from previous day: 11/07 0701 - 11/08 0700 In: 440 [P.O.:340; IV Piggyback:100] Out: -   Intake/Output this shift: No intake/output data recorded.  Physical Exam:  General: Alert and oriented CV: No cyanosis Lungs: equal chest rise Abdomen: Soft, NTND, no rebound or guarding Gu: erythematous left hemiscrotum. Swollen painful left testicle  Lab Results: Recent Labs    12/23/22 1944 12/24/22 0339 12/25/22 0343  HGB 14.5 13.7 13.3  HCT 43.4 41.1 40.3   BMET Recent Labs    12/23/22 1944 12/24/22 0339  NA 137 137  K 3.9 3.8  CL 105 106  CO2 25 22  GLUCOSE 93 112*  BUN 12 10  CREATININE 0.97 0.92  CALCIUM 8.3* 8.0*     Studies/Results: CT ABDOMEN PELVIS W CONTRAST  Result Date: 12/23/2022 CLINICAL DATA:  Sepsis, left testicular tenderness and swelling EXAM: CT ABDOMEN AND PELVIS WITH CONTRAST TECHNIQUE: Multidetector CT imaging of the abdomen and pelvis was performed using the standard protocol following bolus administration of intravenous contrast. RADIATION DOSE REDUCTION: This exam was performed according to the departmental dose-optimization program which includes automated exposure control, adjustment of  the mA and/or kV according to patient size and/or use of iterative reconstruction technique. CONTRAST:  OMNIPAQUE IOHEXOL 300 MG/ML  SOLN COMPARISON:  12/23/2022, 10/17/2022 FINDINGS: Lower chest: No acute pleural or parenchymal lung disease. Hepatobiliary: No focal liver abnormality is seen. No gallstones, gallbladder wall thickening, or biliary dilatation. Pancreas: Unremarkable. No pancreatic ductal dilatation or surrounding inflammatory changes. Spleen: Normal in size without focal abnormality. Adrenals/Urinary Tract: Left renal cortical atrophy and scarring again noted. Staghorn type calculus within the lower pole left kidney, measuring up to 2.4 cm in size. No right-sided calculi or obstructive uropathy. The adrenals are unremarkable. Bladder is only minimally distended, with nonspecific anterior bladder wall thickening likely due to under distension. No other filling defects. Stomach/Bowel: No bowel obstruction or ileus. No bowel wall thickening or inflammatory change. Vascular/Lymphatic: No significant vascular findings are present. No enlarged abdominal or pelvic lymph nodes. Reproductive: Prostate is unremarkable. Left-sided hydrocele and prominent vessels consistent with epididymal orchitis as reported on earlier ultrasound of the scrotum. Other: No free fluid or free intraperitoneal gas. No abdominal wall hernia. Musculoskeletal: No acute or destructive bony abnormalities. Reconstructed images demonstrate no additional findings. IMPRESSION: 1. Prominent vessels within the left hemiscrotum with associated left-sided hydrocele, compatible with the epididymal orchitis reported on earlier scrotal ultrasound. 2. Otherwise no acute intra-abdominal or intrapelvic process. 3. Nonobstructing large left renal calculus, with stable left renal cortical scarring and atrophy. Electronically Signed   By: Sharlet Salina M.D.   On: 12/23/2022 21:24   US SCROTUM W/DOPPLER  Result Date: 12/23/2022 CLINICAL DATA:   Left-sided testicular pain. EXAM: SCROTAL ULTRASOUND DOPPLER  ULTRASOUND OF THE TESTICLES TECHNIQUE: Complete ultrasound examination of the testicles, epididymis, and other scrotal structures was performed. Color and spectral Doppler ultrasound were also utilized to evaluate blood flow to the testicles. COMPARISON:  None Available. FINDINGS: Right testicle Measurements: 3.8 x 1.8 x 2.0 cm. The right testicle appears slightly heterogeneous with overall decreased echogenicity. No discrete mass. Left testicle Measurements: 3.9 x 2.0 x 2.8 cm. No mass or microlithiasis visualized. The left testicle is hyperemic. Right epididymis:  Normal in size and appearance. Left epididymis:  The left epididymis is hyperemic. Hydrocele: Small minimally complex left hydrocele, possibly pyocele. Varicocele:  None visualized. Pulsed Doppler interrogation of both testes demonstrates normal low resistance arterial and venous waveforms bilaterally. IMPRESSION: Findings most consistent with left-sided epididymo-orchitis with a small possible pyocele. Clinical correlation is recommended. Electronically Signed   By: Elgie Collard M.D.   On: 12/23/2022 20:33      LOS: 1 day   Elmon Kirschner, NP [facility] Pager: [phone]  12/25/2022, 8:46 AM

## 2022-12-25 NOTE — Plan of Care (Signed)
  Problem: Clinical Measurements: Goal: Ability to maintain clinical measurements within normal limits will improve Outcome: Progressing Goal: Will remain free from infection Outcome: Progressing   Problem: Activity: Goal: Risk for activity intolerance will decrease Outcome: Progressing   

## 2022-12-25 NOTE — Progress Notes (Signed)
Have made attempts yesterday and today to review for possible dc needs with pt.  Per chart, he is uninsured and no PCP.  Pt raises head to voice today but does not engage.  Will try again later.  Cady Hafen, LCSW

## 2022-12-26 LAB — CBC WITH DIFFERENTIAL/PLATELET
Abs Immature Granulocytes: 0.09 10*3/uL — ABNORMAL HIGH (ref 0.00–0.07)
Basophils Absolute: 0.1 10*3/uL (ref 0.0–0.1)
Basophils Relative: 1 %
Eosinophils Absolute: 0 10*3/uL (ref 0.0–0.5)
Eosinophils Relative: 0 %
HCT: 42.1 % (ref 39.0–52.0)
Hemoglobin: 14 g/dL (ref 13.0–17.0)
Immature Granulocytes: 1 %
Lymphocytes Relative: 12 %
Lymphs Abs: 1.4 10*3/uL (ref 0.7–4.0)
MCH: 30.1 pg (ref 26.0–34.0)
MCHC: 33.3 g/dL (ref 30.0–36.0)
MCV: 90.5 fL (ref 80.0–100.0)
Monocytes Absolute: 0.9 10*3/uL (ref 0.1–1.0)
Monocytes Relative: 8 %
Neutro Abs: 8.7 10*3/uL — ABNORMAL HIGH (ref 1.7–7.7)
Neutrophils Relative %: 78 %
Platelets: 327 10*3/uL (ref 150–400)
RBC: 4.65 MIL/uL (ref 4.22–5.81)
RDW: 12.9 % (ref 11.5–15.5)
WBC: 11.2 10*3/uL — ABNORMAL HIGH (ref 4.0–10.5)
nRBC: 0 % (ref 0.0–0.2)

## 2022-12-26 MED ORDER — BISACODYL 10 MG RE SUPP
10.0000 mg | Freq: Once | RECTAL | Status: AC
  Administered 2022-12-26: 10 mg via RECTAL
  Filled 2022-12-26: qty 1

## 2022-12-26 MED ORDER — POLYETHYLENE GLYCOL 3350 17 G PO PACK
17.0000 g | PACK | Freq: Every day | ORAL | Status: DC
  Administered 2022-12-26 – 2022-12-27 (×2): 17 g via ORAL
  Filled 2022-12-26 (×2): qty 1

## 2022-12-26 MED ORDER — DEXTROSE-SODIUM CHLORIDE 5-0.45 % IV SOLN: INTRAVENOUS | Status: DC

## 2022-12-26 MED ORDER — ALUM & MAG HYDROXIDE-SIMETH 200-200-20 MG/5ML PO SUSP
30.0000 mL | Freq: Four times a day (QID) | ORAL | Status: DC | PRN
  Administered 2022-12-26: 30 mL via ORAL
  Filled 2022-12-26: qty 30

## 2022-12-26 MED ORDER — DOCUSATE SODIUM 100 MG PO CAPS
100.0000 mg | ORAL_CAPSULE | Freq: Two times a day (BID) | ORAL | Status: DC
  Administered 2022-12-26 – 2022-12-27 (×3): 100 mg via ORAL
  Filled 2022-12-26 (×3): qty 1

## 2022-12-26 NOTE — Plan of Care (Signed)
  Problem: Activity: Goal: Risk for activity intolerance will decrease Outcome: Progressing   Problem: Coping: Goal: Level of anxiety will decrease Outcome: Progressing   Problem: Elimination: Goal: Will not experience complications related to bowel motility Outcome: Progressing

## 2022-12-26 NOTE — Progress Notes (Signed)
PROGRESS NOTE    Riley Simmons  WUJ:[id number] DOB: April 14, 1995 DOA: 12/23/2022 PCP: Patient, No Pcp Per    Brief Narrative:  27 year old with history of spina bifida and neurogenic bladder that self catheterizes, history of kidney stones, history of left testicular torsion treated conservatively presented with 1 day of severe left testicular pain, redness and swelling.  In the emergency room temperature 100.9.  On room air.  Otherwise hemodynamically stable.  Urinalysis negative with large leukocytes.  Rapid HIV negative.  CT scan abdomen pelvis with contrast with intact blood vessels left hemiscrotum, left-sided hydrocele, epididymal orchitis.  Due to significant findings patient was admitted to the hospital.  Subjective: Patient seen and examined.  Mother was at the bedside.  Overnight events noted.  Patient had some epigastric discomfort and dizziness early morning.  He is feeling dizzy on sitting up.  Denies any nausea vomiting, however he has not have a bowel movement for last 3 days.  He is not willing to take any laxative because that causes him to have diarrhea for many days.  He is agreeable to do Dulcolax suppository.  Scrotal pain is slightly better today as per the patient.  Afebrile.  Assessment & Plan:   Left epididymoorchitis with spreading cellulitis and pyocele suspected. Currently on Rocephin and doxycycline.  Scrotal support.  Adequate pain medications.  Mobility.  Adequate oral IV opiates for pain relief.  Urology continues to follow-up.  Anticipating conservative management. Likely home once symptoms are better controlled.  Neurogenic bladder: Patient self catheterizes.  He can continue that.   DVT prophylaxis: enoxaparin (LOVENOX) injection 40 mg Start: 12/24/22 2200   Code Status: Full code Family Communication: Mother at the bedside Disposition Plan: Status is: Inpatient Remains inpatient appropriate because: Significant infection, IV antibiotics      Consultants:  urology  Procedures:  None  Antimicrobials:  Rocephin 11/6--- Doxycycline 11/7---     Objective: Vitals:   12/24/22 2057 12/25/22 0502 12/25/22 2249 12/26/22 0610  BP:  (!) 136/100 (!) 126/96 129/75  Pulse: 92 85 93 (!) 108  Resp: 17 16 18 17   Temp: 99.8 F (37.7 C) 99 F (37.2 C) 98.3 F (36.8 C) 99.5 F (37.5 C)  TempSrc: Oral Oral Oral Oral  SpO2: 98% 99% 99% 97%  Weight:      Height:        Intake/Output Summary (Last 24 hours) at 12/26/2022 1136 Last data filed at 12/26/2022 0000 Gross per 24 hour  Intake 460 ml  Output --  Net 460 ml   Filed Weights   12/23/22 1801  Weight: 81.6 kg    Examination:  General exam: Appears calm but slightly anxious. Respiratory system: Clear to auscultation. Respiratory effort normal. Cardiovascular system: S1 & S2 heard, RRR.  Gastrointestinal system:  Soft.  Nontender.  Bowel sound present. Enlarged left testicle, tender to touch.  Thickened testicular swelling, mild tenderness.  Skin is intact.   Central nervous system: Alert and oriented. No focal neurological deficits.    Data Reviewed: I have personally reviewed following labs and imaging studies  CBC: Recent Labs  Lab 12/23/22 1944 12/24/22 0339 12/25/22 0343 12/26/22 0908  WBC 18.3* 20.5* 18.2* 11.2*  NEUTROABS 15.1*  --  14.7* 8.7*  HGB 14.5 13.7 13.3 14.0  HCT 43.4 41.1 40.3 42.1  MCV 91.4 90.9 91.6 90.5  PLT 262 262 247 327   Basic Metabolic Panel: Recent Labs  Lab 12/23/22 1944 12/24/22 0339  NA 137 137  K 3.9 3.8  CL 105 106  CO2 25 22  GLUCOSE 93 112*  BUN 12 10  CREATININE 0.97 0.92  CALCIUM 8.3* 8.0*   GFR: Estimated Creatinine Clearance: 119.9 mL/min (by C-G formula based on SCr of 0.92 mg/dL). Liver Function Tests: Recent Labs  Lab 12/23/22 1944  AST 13*  ALT 12  ALKPHOS 82  BILITOT 0.9  PROT 7.2  ALBUMIN 4.1   No results for input(s): "LIPASE", "AMYLASE" in the last 168 hours. No results for  input(s): "AMMONIA" in the last 168 hours. Coagulation Profile: No results for input(s): "INR", "PROTIME" in the last 168 hours. Cardiac Enzymes: No results for input(s): "CKTOTAL", "CKMB", "CKMBINDEX", "TROPONINI" in the last 168 hours. BNP (last 3 results) No results for input(s): "PROBNP" in the last 8760 hours. HbA1C: No results for input(s): "HGBA1C" in the last 72 hours. CBG: No results for input(s): "GLUCAP" in the last 168 hours. Lipid Profile: No results for input(s): "CHOL", "HDL", "LDLCALC", "TRIG", "CHOLHDL", "LDLDIRECT" in the last 72 hours. Thyroid Function Tests: No results for input(s): "TSH", "T4TOTAL", "FREET4", "T3FREE", "THYROIDAB" in the last 72 hours. Anemia Panel: No results for input(s): "VITAMINB12", "FOLATE", "FERRITIN", "TIBC", "IRON", "RETICCTPCT" in the last 72 hours. Sepsis Labs: Recent Labs  Lab 12/23/22 1944  LATICACIDVEN 0.7    Recent Results (from the past 240 hour(s))  Urine Culture     Status: Abnormal   Collection Time: 12/23/22  7:44 PM   Specimen: Urine, Clean Catch  Result Value Ref Range Status   Specimen Description   Final    URINE, CLEAN CATCH Performed at [facility], [address], [place], [place] [address]    Special Requests   Final    NONE Performed at [facility], [address], [place], [place] [address]    Culture (A)  Final    <10,000 COLONIES/mL INSIGNIFICANT GROWTH Performed at [facility], [address], [place], [place] [address]    Report Status 12/24/2022 FINAL  Final  Blood culture (routine x 2)     Status: None (Preliminary result)   Collection Time: 12/23/22  7:45 PM   Specimen: BLOOD  Result Value Ref Range Status   Specimen Description   Final    BLOOD RIGHT ANTECUBITAL Performed at [facility], [address] [place], [place] [address]    Special Requests   Final    BOTTLES DRAWN AEROBIC AND ANAEROBIC Blood Culture results may not  be optimal due to an inadequate volume of blood received in culture bottles Performed at [facility], [address], [place], [place] [address]    Culture   Final    NO GROWTH 3 DAYS Performed at [facility], [address] [place], [place] [address]    Report Status PENDING  Incomplete  Blood culture (routine x 2)     Status: None (Preliminary result)   Collection Time: 12/23/22  7:45 PM   Specimen: BLOOD LEFT FOREARM  Result Value Ref Range Status   Specimen Description   Final    BLOOD LEFT FOREARM Performed at [facility], [address] [place], [place] [address]    Special Requests   Final    BOTTLES DRAWN AEROBIC AND ANAEROBIC Blood Culture results may not be optimal due to an inadequate volume of blood received in culture bottles Performed at [facility], [address] [place], [place] [address]    Culture   Final  NO GROWTH 3 DAYS Performed at [facility], [address] [place], [place] [address]    Report Status PENDING  Incomplete         Radiology Studies: No results found.      Scheduled Meds:  docusate sodium  100 mg Oral BID   doxycycline  100 mg Oral Q12H   enoxaparin (LOVENOX) injection  40 mg Subcutaneous Q24H   Continuous Infusions:  cefTRIAXone (ROCEPHIN)  IV 2 g (12/25/22 2017)   dextrose 5 % and 0.45 % NaCl 75 mL/hr at 12/26/22 1054     LOS: 2 days    Time spent: 35 minutes    Dorcas Carrow, MD [facility]

## 2022-12-26 NOTE — Progress Notes (Signed)
1610: Pt c/o chest pain after taking a shower with pain rate 8/10. When assessing pt stated tightness to the upper gastric area. Vitals obtained. On call PA notified. STAT EKG obtained as ordered and notified about the result. Will administer the prescribed meds and continue to monitor.

## 2022-12-26 NOTE — Plan of Care (Signed)
  Problem: Nutrition: Goal: Adequate nutrition will be maintained Outcome: Progressing   Problem: Coping: Goal: Level of anxiety will decrease Outcome: Progressing   Problem: Pain Management: Goal: General experience of comfort will improve Outcome: Progressing   Problem: Safety: Goal: Ability to remain free from injury will improve Outcome: Progressing

## 2022-12-27 MED ORDER — OXYCODONE HCL 5 MG PO TABS: 5.0000 mg | ORAL_TABLET | Freq: Four times a day (QID) | ORAL | 0 refills | Status: DC | PRN

## 2022-12-27 MED ORDER — DOCUSATE SODIUM 100 MG PO CAPS: 100.0000 mg | ORAL_CAPSULE | Freq: Two times a day (BID) | ORAL | 0 refills | Status: DC

## 2022-12-27 MED ORDER — POLYETHYLENE GLYCOL 3350 17 G PO PACK: 17.0000 g | PACK | Freq: Every day | ORAL | 0 refills | Status: DC | PRN

## 2022-12-27 MED ORDER — DOXYCYCLINE HYCLATE 100 MG PO TABS: 100.0000 mg | ORAL_TABLET | Freq: Two times a day (BID) | ORAL | 0 refills | Status: AC

## 2022-12-27 MED ORDER — CEPHALEXIN 500 MG PO CAPS: 500.0000 mg | ORAL_CAPSULE | Freq: Three times a day (TID) | ORAL | 0 refills | Status: AC

## 2022-12-27 NOTE — Discharge Summary (Signed)
Physician Discharge Summary  Riley Simmons:[id number] DOB: 1995/12/10 DOA: 12/23/2022  PCP: Patient, No Pcp Per  Admit date: 12/23/2022 Discharge date: 12/27/2022  Admitted From: Home Disposition: Home  Recommendations for Outpatient Follow-up:  Follow up with PCP in 1-2 weeks Call and schedule follow-up with urology  Discharge Condition: Stable CODE STATUS: Full code Diet recommendation: Regular diet  Discharge summary: 27 year old with history of spina bifida and neurogenic bladder that self catheterizes, history of kidney stones, history of left testicular torsion treated conservatively presented with 1 day of severe left testicular pain, redness and swelling. In the emergency room temperature 100.9. On room air. Otherwise hemodynamically stable. Urinalysis negative with large leukocytes. Rapid HIV negative. CT scan abdomen pelvis with contrast with intact blood vessels left hemiscrotum, left-sided hydrocele, epididymal orchitis.  WBC count was 18,000.  Due to significant findings patient was admitted to the hospital.   # Left epididymoorchitis with spreading cellulitis and pyocele suspected. Blood cultures negative.  Urine culture with less than 10,000 colonies of E. coli. Patient was admitted and treated with IV Rocephin and oral doxycycline due to having significant symptoms of pain and spreading erythema.  Followed by urology.  He is gradually improving.  Surrounding redness has improved, still has induration of left scrotum.  Discussed with urology.  As per the recommendations, will continue Keflex and doxycycline for 7 more days.  Scrotal support.  Pain medications.  Urology to schedule follow-up. Patient tends to get constipated, prescribed a stool softener and also laxatives as needed.   Neurogenic bladder: Patient self catheterizes.  He can continue that.  Stable for discharge.  Discharge Diagnoses:  Principal Problem:   Sepsis secondary to UTI [facility]) Active  Problems:   Epididymoorchitis   Neurogenic bladder   Orchitis with abscess    Discharge Instructions  Discharge Instructions     Diet general   Complete by: As directed    Discharge instructions   Complete by: As directed    Call and schedule follow up with Urology as information given   Increase activity slowly   Complete by: As directed       Allergies as of 12/27/2022       Reactions   Levofloxacin Hives, Swelling, Anxiety   Amoxicillin Hives, Rash   Latex Rash        Medication List     STOP taking these medications    cefadroxil 500 MG capsule Commonly known as: DURICEF       TAKE these medications    acetaminophen 500 MG tablet Commonly known as: TYLENOL Take 500-1,000 mg by mouth every 6 (six) hours as needed for mild pain (pain score 1-3).   cephALEXin 500 MG capsule Commonly known as: KEFLEX Take 1 capsule (500 mg total) by mouth 3 (three) times daily for 7 days.   docusate sodium 100 MG capsule Commonly known as: COLACE Take 1 capsule (100 mg total) by mouth 2 (two) times daily.   doxycycline 100 MG tablet Commonly known as: VIBRA-TABS Take 1 tablet (100 mg total) by mouth every 12 (twelve) hours for 7 days.   Ibuprofen 200 MG Caps Take 200-400 mg by mouth every 6 (six) hours as needed (for pain or headaches).   oxyCODONE 5 MG immediate release tablet Commonly known as: Oxy IR/ROXICODONE Take 1 tablet (5 mg total) by mouth every 6 (six) hours as needed for moderate pain (pain score 4-6).   polyethylene glycol 17 g packet Commonly known as: MIRALAX / GLYCOLAX Take 17 g  by mouth daily as needed for mild constipation or moderate constipation.        Follow-up Information     Riley Brook, MD. Call in 1 day(s).   Specialty: Urology Why: call office to make an appointment Contact information: [address] [place] [place] [address] [phone]                Allergies  Allergen Reactions   Levofloxacin  Hives, Swelling and Anxiety   Amoxicillin Hives and Rash   Latex Rash    Consultations: Neurology   Procedures/Studies: CT ABDOMEN PELVIS W CONTRAST  Result Date: 12/23/2022 CLINICAL DATA:  Sepsis, left testicular tenderness and swelling EXAM: CT ABDOMEN AND PELVIS WITH CONTRAST TECHNIQUE: Multidetector CT imaging of the abdomen and pelvis was performed using the standard protocol following bolus administration of intravenous contrast. RADIATION DOSE REDUCTION: This exam was performed according to the departmental dose-optimization program which includes automated exposure control, adjustment of the mA and/or kV according to patient size and/or use of iterative reconstruction technique. CONTRAST:  OMNIPAQUE IOHEXOL 300 MG/ML  SOLN COMPARISON:  12/23/2022, 10/17/2022 FINDINGS: Lower chest: No acute pleural or parenchymal lung disease. Hepatobiliary: No focal liver abnormality is seen. No gallstones, gallbladder wall thickening, or biliary dilatation. Pancreas: Unremarkable. No pancreatic ductal dilatation or surrounding inflammatory changes. Spleen: Normal in size without focal abnormality. Adrenals/Urinary Tract: Left renal cortical atrophy and scarring again noted. Staghorn type calculus within the lower pole left kidney, measuring up to 2.4 cm in size. No right-sided calculi or obstructive uropathy. The adrenals are unremarkable. Bladder is only minimally distended, with nonspecific anterior bladder wall thickening likely due to under distension. No other filling defects. Stomach/Bowel: No bowel obstruction or ileus. No bowel wall thickening or inflammatory change. Vascular/Lymphatic: No significant vascular findings are present. No enlarged abdominal or pelvic lymph nodes. Reproductive: Prostate is unremarkable. Left-sided hydrocele and prominent vessels consistent with epididymal orchitis as reported on earlier ultrasound of the scrotum. Other: No free fluid or free intraperitoneal gas. No  abdominal wall hernia. Musculoskeletal: No acute or destructive bony abnormalities. Reconstructed images demonstrate no additional findings. IMPRESSION: 1. Prominent vessels within the left hemiscrotum with associated left-sided hydrocele, compatible with the epididymal orchitis reported on earlier scrotal ultrasound. 2. Otherwise no acute intra-abdominal or intrapelvic process. 3. Nonobstructing large left renal calculus, with stable left renal cortical scarring and atrophy. Electronically Signed   By: Sharlet Salina M.D.   On: 12/23/2022 21:24   US SCROTUM W/DOPPLER  Result Date: 12/23/2022 CLINICAL DATA:  Left-sided testicular pain. EXAM: SCROTAL ULTRASOUND DOPPLER ULTRASOUND OF THE TESTICLES TECHNIQUE: Complete ultrasound examination of the testicles, epididymis, and other scrotal structures was performed. Color and spectral Doppler ultrasound were also utilized to evaluate blood flow to the testicles. COMPARISON:  None Available. FINDINGS: Right testicle Measurements: 3.8 x 1.8 x 2.0 cm. The right testicle appears slightly heterogeneous with overall decreased echogenicity. No discrete mass. Left testicle Measurements: 3.9 x 2.0 x 2.8 cm. No mass or microlithiasis visualized. The left testicle is hyperemic. Right epididymis:  Normal in size and appearance. Left epididymis:  The left epididymis is hyperemic. Hydrocele: Small minimally complex left hydrocele, possibly pyocele. Varicocele:  None visualized. Pulsed Doppler interrogation of both testes demonstrates normal low resistance arterial and venous waveforms bilaterally. IMPRESSION: Findings most consistent with left-sided epididymo-orchitis with a small possible pyocele. Clinical correlation is recommended. Electronically Signed   By: Elgie Collard M.D.   On: 12/23/2022 20:33   (Echo, Carotid, EGD, Colonoscopy,  ERCP)    Subjective: Patient seen and examined.  Mild to moderate pain persist.  He had low-grade fever overnight.  WBC count normalized.   Mother at the bedside.   Discharge Exam: Vitals:   12/26/22 2244 12/27/22 0607  BP: 120/70 133/84  Pulse: 77 68  Resp: 18 18  Temp: 98.7 F (37.1 C) 98 F (36.7 C)  SpO2: 96% 99%   Vitals:   12/26/22 1337 12/26/22 1802 12/26/22 2244 12/27/22 0607  BP: (!) 137/92 108/76 120/70 133/84  Pulse: (!) 107 92 77 68  Resp:   18 18  Temp: 100.1 F (37.8 C) (!) 101.1 F (38.4 C) 98.7 F (37.1 C) 98 F (36.7 C)  TempSrc:  Oral Oral Oral  SpO2: 98%  96% 99%  Weight:      Height:        General: Pt is alert, awake, not in acute distress Cardiovascular: RRR, S1/S2 +, no rubs, no gallops Respiratory: CTA bilaterally, no wheezing, no rhonchi Abdominal: Soft, NT, ND, bowel sounds + Extremities: no edema, no cyanosis  Left scrotum with moderately painful swelling, redness present.  Induration and redness of the skin is improving.    The results of significant diagnostics from this hospitalization (including imaging, microbiology, ancillary and laboratory) are listed below for reference.     Microbiology: Recent Results (from the past 240 hour(s))  Urine Culture     Status: Abnormal   Collection Time: 12/23/22  7:44 PM   Specimen: Urine, Clean Catch  Result Value Ref Range Status   Specimen Description   Final    URINE, CLEAN CATCH Performed at [facility], [address], [place], [place] [address]    Special Requests   Final    NONE Performed at [facility], [address], [place], [place] [address]    Culture (A)  Final    <10,000 COLONIES/mL INSIGNIFICANT GROWTH Performed at [facility], [address] [place], [place] [address]    Report Status 12/24/2022 FINAL  Final  Blood culture (routine x 2)     Status: None (Preliminary result)   Collection Time: 12/23/22  7:45 PM   Specimen: BLOOD  Result Value Ref Range Status   Specimen Description   Final    BLOOD RIGHT ANTECUBITAL Performed at [facility], [address] [place], [place] [address]    Special Requests   Final    BOTTLES DRAWN AEROBIC AND ANAEROBIC Blood Culture results may not be optimal due to an inadequate volume of blood received in culture bottles Performed at [facility], [address], [place], [place] [address]    Culture   Final    NO GROWTH 4 DAYS Performed at [facility], [address] [place], [place] [address]    Report Status PENDING  Incomplete  Blood culture (routine x 2)     Status: None (Preliminary result)   Collection Time: 12/23/22  7:45 PM   Specimen: BLOOD LEFT FOREARM  Result Value Ref Range Status   Specimen Description   Final    BLOOD LEFT FOREARM Performed at [facility], [address] [place], [place] [address]    Special Requests   Final    BOTTLES DRAWN AEROBIC AND ANAEROBIC Blood Culture results may not be optimal due to an inadequate volume of blood received in culture bottles Performed at [facility], [address], [place], [place] [address]  Culture   Final    NO GROWTH 4 DAYS Performed at [facility], [address] [place], [place] [address]    Report Status PENDING  Incomplete     Labs: BNP (last 3 results) No results for input(s): "BNP" in the last 8760 hours. Basic Metabolic Panel: Recent Labs  Lab 12/23/22 1944 12/24/22 0339  NA 137 137  K 3.9 3.8  CL 105 106  CO2 25 22  GLUCOSE 93 112*  BUN 12 10  CREATININE 0.97 0.92  CALCIUM 8.3* 8.0*   Liver Function Tests: Recent Labs  Lab 12/23/22 1944  AST 13*  ALT 12  ALKPHOS 82  BILITOT 0.9  PROT 7.2  ALBUMIN 4.1   No results for input(s): "LIPASE", "AMYLASE" in the last 168 hours. No results for input(s): "AMMONIA" in the last 168 hours. CBC: Recent Labs  Lab 12/23/22 1944 12/24/22 0339 12/25/22 0343 12/26/22 0908  WBC 18.3* 20.5* 18.2* 11.2*  NEUTROABS 15.1*  --  14.7* 8.7*  HGB 14.5 13.7 13.3 14.0  HCT 43.4 41.1 40.3  42.1  MCV 91.4 90.9 91.6 90.5  PLT 262 262 247 327   Cardiac Enzymes: No results for input(s): "CKTOTAL", "CKMB", "CKMBINDEX", "TROPONINI" in the last 168 hours. BNP: Invalid input(s): "POCBNP" CBG: No results for input(s): "GLUCAP" in the last 168 hours. D-Dimer No results for input(s): "DDIMER" in the last 72 hours. Hgb A1c No results for input(s): "HGBA1C" in the last 72 hours. Lipid Profile No results for input(s): "CHOL", "HDL", "LDLCALC", "TRIG", "CHOLHDL", "LDLDIRECT" in the last 72 hours. Thyroid function studies No results for input(s): "TSH", "T4TOTAL", "T3FREE", "THYROIDAB" in the last 72 hours.  Invalid input(s): "FREET3" Anemia work up No results for input(s): "VITAMINB12", "FOLATE", "FERRITIN", "TIBC", "IRON", "RETICCTPCT" in the last 72 hours. Urinalysis    Component Value Date/Time   COLORURINE YELLOW 12/23/2022 1944   APPEARANCEUR HAZY (A) 12/23/2022 1944   APPEARANCEUR Clear 11/28/2020 1318   LABSPEC 1.019 12/23/2022 1944   PHURINE 5.0 12/23/2022 1944   GLUCOSEU NEGATIVE 12/23/2022 1944   HGBUR MODERATE (A) 12/23/2022 1944   BILIRUBINUR NEGATIVE 12/23/2022 1944   BILIRUBINUR Negative 11/28/2020 1318   KETONESUR NEGATIVE 12/23/2022 1944   PROTEINUR 30 (A) 12/23/2022 1944   UROBILINOGEN 0.2 12/28/2019 0902   NITRITE NEGATIVE 12/23/2022 1944   LEUKOCYTESUR LARGE (A) 12/23/2022 1944   Sepsis Labs Recent Labs  Lab 12/23/22 1944 12/24/22 0339 12/25/22 0343 12/26/22 0908  WBC 18.3* 20.5* 18.2* 11.2*   Microbiology Recent Results (from the past 240 hour(s))  Urine Culture     Status: Abnormal   Collection Time: 12/23/22  7:44 PM   Specimen: Urine, Clean Catch  Result Value Ref Range Status   Specimen Description   Final    URINE, CLEAN CATCH Performed at [facility], [address] [place], [place] [address]    Special Requests   Final    NONE Performed at [facility], [address] [place], [place]  [address]    Culture (A)  Final    <10,000 COLONIES/mL INSIGNIFICANT GROWTH Performed at [facility], [address], [place], [place] [address]    Report Status 12/24/2022 FINAL  Final  Blood culture (routine x 2)     Status: None (Preliminary result)   Collection Time: 12/23/22  7:45 PM   Specimen: BLOOD  Result Value Ref Range Status   Specimen Description   Final    BLOOD RIGHT ANTECUBITAL Performed at [facility], [address], [place], [place] [address]    Special Requests   Final    BOTTLES DRAWN AEROBIC AND ANAEROBIC Blood Culture results may not be optimal due to an inadequate volume of blood received in culture bottles Performed at [facility], [address] [place], [place] [address]    Culture   Final    NO GROWTH 4 DAYS Performed at [facility], [address], [place], [place] [address]    Report Status PENDING  Incomplete  Blood culture (routine x 2)     Status: None (Preliminary result)   Collection Time: 12/23/22  7:45 PM   Specimen: BLOOD LEFT FOREARM  Result Value Ref Range Status   Specimen Description   Final    BLOOD LEFT FOREARM Performed at [facility], [address], [place], [place] [address]    Special Requests   Final    BOTTLES DRAWN AEROBIC AND ANAEROBIC Blood Culture results may not be optimal due to an inadequate volume of blood received in culture bottles Performed at [facility], [address], [place], [place] [address]    Culture   Final    NO GROWTH 4 DAYS Performed at [facility], [address] [place], [place] [address]    Report Status PENDING  Incomplete     Time coordinating discharge: 32 minutes  SIGNED:   Dorcas Carrow, MD  [facility] 12/27/2022, 8:59 AM

## 2022-12-27 NOTE — Plan of Care (Signed)
  Problem: Activity: Goal: Risk for activity intolerance will decrease Outcome: Progressing   Problem: Nutrition: Goal: Adequate nutrition will be maintained Outcome: Progressing   Problem: Elimination: Goal: Will not experience complications related to bowel motility Outcome: Progressing   

## 2022-12-27 NOTE — Plan of Care (Signed)
  Problem: Fluid Volume: Goal: Hemodynamic stability will improve Outcome: Adequate for Discharge   Problem: Clinical Measurements: Goal: Diagnostic test results will improve Outcome: Adequate for Discharge Goal: Signs and symptoms of infection will decrease Outcome: Adequate for Discharge   Problem: Respiratory: Goal: Ability to maintain adequate ventilation will improve Outcome: Adequate for Discharge   Problem: Education: Goal: Knowledge of General Education information will improve Description: Including pain rating scale, medication(s)/side effects and non-pharmacologic comfort measures Outcome: Adequate for Discharge   Problem: Health Behavior/Discharge Planning: Goal: Ability to manage health-related needs will improve Outcome: Adequate for Discharge   Problem: Clinical Measurements: Goal: Ability to maintain clinical measurements within normal limits will improve Outcome: Adequate for Discharge Goal: Will remain free from infection Outcome: Adequate for Discharge Goal: Diagnostic test results will improve Outcome: Adequate for Discharge Goal: Respiratory complications will improve Outcome: Adequate for Discharge Goal: Cardiovascular complication will be avoided Outcome: Adequate for Discharge   Problem: Activity: Goal: Risk for activity intolerance will decrease 12/27/2022 1033 by Alice Rieger A, LPN Outcome: Adequate for Discharge 12/27/2022 0739 by Delila Spence, LPN Outcome: Progressing   Problem: Nutrition: Goal: Adequate nutrition will be maintained 12/27/2022 1033 by Delila Spence, LPN Outcome: Adequate for Discharge 12/27/2022 0739 by Delila Spence, LPN Outcome: Progressing   Problem: Coping: Goal: Level of anxiety will decrease Outcome: Adequate for Discharge   Problem: Elimination: Goal: Will not experience complications related to bowel motility 12/27/2022 1033 by Delila Spence, LPN Outcome: Adequate for Discharge 12/27/2022  0739 by Delila Spence, LPN Outcome: Progressing Goal: Will not experience complications related to urinary retention Outcome: Adequate for Discharge   Problem: Pain Management: Goal: General experience of comfort will improve Outcome: Adequate for Discharge   Problem: Safety: Goal: Ability to remain free from injury will improve Outcome: Adequate for Discharge   Problem: Skin Integrity: Goal: Risk for impaired skin integrity will decrease Outcome: Adequate for Discharge

## 2022-12-27 NOTE — Plan of Care (Signed)

## 2022-12-28 LAB — CULTURE, BLOOD (ROUTINE X 2)
Culture: NO GROWTH
Culture: NO GROWTH

## 2023-06-03 ENCOUNTER — Ambulatory Visit (HOSPITAL_COMMUNITY)
Admission: EM | Admit: 2023-06-03 | Discharge: 2023-06-03 | Disposition: A | Discharge status: Home / Self Care | Payer: Self-pay | Attending: Internal Medicine | Admitting: Internal Medicine

## 2023-06-03 ENCOUNTER — Encounter (HOSPITAL_COMMUNITY): Payer: Self-pay

## 2023-06-03 DIAGNOSIS — J069 Acute upper respiratory infection, unspecified: Secondary | ICD-10-CM | POA: Insufficient documentation

## 2023-06-03 DIAGNOSIS — N3001 Acute cystitis with hematuria: Secondary | ICD-10-CM | POA: Insufficient documentation

## 2023-06-03 LAB — POCT URINALYSIS DIP (MANUAL ENTRY)
Bilirubin, UA: NEGATIVE
Glucose, UA: NEGATIVE mg/dL
Ketones, POC UA: NEGATIVE mg/dL
Nitrite, UA: POSITIVE — AB
Protein Ur, POC: 30 mg/dL — AB
Spec Grav, UA: 1.02 (ref 1.010–1.025)
Urobilinogen, UA: 1 U/dL
pH, UA: 6.5 (ref 5.0–8.0)

## 2023-06-03 LAB — POC COVID19/FLU A&B COMBO
Covid Antigen, POC: NEGATIVE
Influenza A Antigen, POC: NEGATIVE
Influenza B Antigen, POC: NEGATIVE

## 2023-06-03 MED ORDER — PREDNISONE 20 MG PO TABS: 40.0000 mg | ORAL_TABLET | Freq: Every day | ORAL | 0 refills | Status: AC

## 2023-06-03 MED ORDER — CEFADROXIL 500 MG PO CAPS: 1000.0000 mg | ORAL_CAPSULE | Freq: Two times a day (BID) | ORAL | 0 refills | Status: AC

## 2023-06-03 MED ORDER — PROMETHAZINE-DM 6.25-15 MG/5ML PO SYRP: 5.0000 mL | ORAL_SOLUTION | Freq: Three times a day (TID) | ORAL | 0 refills | Status: DC | PRN

## 2023-06-03 NOTE — ED Triage Notes (Signed)
 Pt c/o cough and congestion x2 days.   Pt states does self caths and having urinary frequency and odor x1wk. Denies taken any meds.

## 2023-06-03 NOTE — Discharge Instructions (Addendum)
 Flu A, Flu B and Covid are negative. Symptoms are most consistent with a viral infection.  This does not require antibiotic treatment.  We focus treatment on improving the symptoms.  The urinalysis was most consistent with a urinary tract infection with blood, protein and positive nitrites. We will treat with the following:  Duricef (Cefadroxil) 1,000 mg (2 tablets) daily for 5 days. This is an antibiotic for UTI. We will also send your urine for a culture to ensure this is effective.  Promethazine DM 5 mL every 8 hours as needed for cough.  Use caution as this medication can cause drowsiness. Prednisone 40 mg (2 tablets) once daily for 5 days. Take this in the morning.  This is a steroid to help with inflammation and pain.  Rest and stay hydrated.   Return to urgent care or PCP if symptoms worsen or fail to resolve.

## 2023-06-03 NOTE — ED Provider Notes (Signed)
 [facility]    CSN: [id number] Arrival date & time: 06/03/23  1529      History   Chief Complaint Chief Complaint  Patient presents with   Cough   Urinary Tract Infection    HPI Riley Simmons is a 28 y.o. male.   28 year old male who presents urgent care with complaints of cough, congestion, sore throat, fatigue, fevers.  This started about 2 days ago.  Last Friday he went on a trip with numerous friends to [place].  They drove.  Several people have now gotten sick but he is not sure that anyone has been diagnosed with anything yet.  He reports that he has not been able to eat or drink much due to difficulty swallowing from a sore throat as well.  He is feeling very fatigued and spent most of the day in bed yesterday.  He also reports that for the last week he has had an odor and urinary frequency.  He self caths due to spina bifida and neurogenic bladder.  He has had issues with this in the past but is normally able to manage at home by changing his diet and drinking plenty of water but this is not helping now.  He denies any abdominal pain although his abdomen is a little sore from having food poisoning the first night that he was in [place].  He denies any hematuria or suprapubic pain.   Cough Associated symptoms: fever and sore throat   Associated symptoms: no chest pain, no chills, no ear pain, no rash and no shortness of breath   Urinary Tract Infection Presenting symptoms: no dysuria   Associated symptoms: fever and urinary frequency   Associated symptoms: no abdominal pain, no hematuria and no vomiting     Past Medical History:  Diagnosis Date   History of kidney stones    Marijuana smoker, continuous    Scoliosis    Spina bifida (HCC)    Urethritis     Patient Active Problem List   Diagnosis Date Noted   Orchitis with abscess 12/24/2022   Epididymoorchitis 12/23/2022   Sepsis secondary to UTI (HCC) 12/23/2022   Pyelonephritis 10/17/2022   Spina  bifida (HCC) 09/20/2017   Neurogenic bladder 09/20/2017   Leukocytosis    Pain in right testicle    Epididymitis 08/16/2017    Past Surgical History:  Procedure Laterality Date   BACK SURGERY     as a baby due to spina bifida    BLADDER SURGERY     CYSTOSCOPY WITH LITHOLAPAXY N/A 12/24/2020   Procedure: CYSTOSCOPY WITH LITHOLAPAXY;  Surgeon: Riki Altes, MD;  Location: [facility];  Service: Urology;  Laterality: N/A;       Home Medications    Prior to Admission medications   Medication Sig Start Date End Date Taking? Authorizing Provider  acetaminophen (TYLENOL) 500 MG tablet Take 500-1,000 mg by mouth every 6 (six) hours as needed for mild pain (pain score 1-3).    [provider]  docusate sodium (COLACE) 100 MG capsule Take 1 capsule (100 mg total) by mouth 2 (two) times daily. 12/27/22   Dorcas Carrow, MD  Ibuprofen 200 MG CAPS Take 200-400 mg by mouth every 6 (six) hours as needed (for pain or headaches).    [provider]  polyethylene glycol (MIRALAX / GLYCOLAX) 17 g packet Take 17 g by mouth daily as needed for mild constipation or moderate constipation. 12/27/22   Dorcas Carrow, MD    Family History  Family History  Problem Relation Age of Onset   Deafness Mother     Social History Social History   Tobacco Use   Smoking status: Former    Current packs/day: 0.50    Types: Cigarettes   Smokeless tobacco: Never  Vaping Use   Vaping status: Never Used  Substance Use Topics   Alcohol use: Not Currently   Drug use: Yes    Types: Marijuana    Comment: daily     Allergies   Levofloxacin, Amoxicillin, and Latex   Review of Systems Review of Systems  Constitutional:  Positive for fever. Negative for chills.  HENT:  Positive for congestion, sore throat and trouble swallowing. Negative for ear pain.   Eyes:  Negative for pain and visual disturbance.  Respiratory:  Positive for cough. Negative for shortness of breath.   Cardiovascular:   Negative for chest pain and palpitations.  Gastrointestinal:  Negative for abdominal pain and vomiting.  Genitourinary:  Positive for frequency. Negative for dysuria and hematuria.       Foul smelling   Musculoskeletal:  Negative for arthralgias and back pain.  Skin:  Negative for color change and rash.  Neurological:  Negative for seizures and syncope.  All other systems reviewed and are negative.    Physical Exam Triage Vital Signs ED Triage Vitals  Encounter Vitals Group     BP 06/03/23 1601 123/89     Systolic BP Percentile --      Diastolic BP Percentile --      Pulse Rate 06/03/23 1601 88     Resp 06/03/23 1601 18     Temp 06/03/23 1601 99.2 F (37.3 C)     Temp Source 06/03/23 1601 Oral     SpO2 06/03/23 1601 97 %     Weight --      Height --      Head Circumference --      Peak Flow --      Pain Score 06/03/23 1602 3     Pain Loc --      Pain Education --      Exclude from Growth Chart --    No data found.  Updated Vital Signs BP 123/89 (BP Location: Right Arm)   Pulse 88   Temp 99.2 F (37.3 C) (Oral)   Resp 18   SpO2 97%   Visual Acuity Right Eye Distance:   Left Eye Distance:   Bilateral Distance:    Right Eye Near:   Left Eye Near:    Bilateral Near:     Physical Exam Vitals and nursing note reviewed.  Constitutional:      General: He is not in acute distress.    Appearance: He is well-developed.  HENT:     Head: Normocephalic and atraumatic.     Right Ear: Tympanic membrane normal.     Left Ear: Tympanic membrane normal.     Nose: Congestion present.     Mouth/Throat:     Mouth: Mucous membranes are moist.     Pharynx: Posterior oropharyngeal erythema present.  Eyes:     Conjunctiva/sclera: Conjunctivae normal.  Cardiovascular:     Rate and Rhythm: Normal rate and regular rhythm.     Heart sounds: No murmur heard. Pulmonary:     Effort: Pulmonary effort is normal. No respiratory distress.     Breath sounds: Examination of the  right-upper field reveals wheezing. Wheezing present. No decreased breath sounds or rhonchi.     Comments: Very faint  wheeze in the right upper Abdominal:     Palpations: Abdomen is soft.     Tenderness: There is no abdominal tenderness.  Musculoskeletal:        General: No swelling.     Cervical back: Neck supple.  Skin:    General: Skin is warm and dry.     Capillary Refill: Capillary refill takes less than 2 seconds.  Neurological:     Mental Status: He is alert.  Psychiatric:        Mood and Affect: Mood normal.     UC Treatments / Results  Labs (all labs ordered are listed, but only abnormal results are displayed) Labs Reviewed  POCT URINALYSIS DIP (MANUAL ENTRY)    EKG   Radiology No results found.  Procedures Procedures (including critical care time)  Medications Ordered in UC Medications - No data to display  Initial Impression / Assessment and Plan / UC Course  I have reviewed the triage vital signs and the nursing notes.  Pertinent labs & imaging results that were available during my care of the patient were reviewed by me and considered in my medical decision making (see chart for details).     Acute cystitis with hematuria  Viral upper respiratory tract infection with cough   Flu A, Flu B and Covid are negative. Symptoms are most consistent with a viral infection.  This does not require antibiotic treatment.  We focus treatment on improving the symptoms.  The urinalysis was most consistent with a urinary tract infection with blood, protein and positive nitrites. We will treat with the following:  Duricef (Cefadroxil) 1,000 mg (2 tablets) daily for 5 days. This is an antibiotic for UTI. We will also send your urine for a culture to ensure this is effective.  Promethazine DM 5 mL every 8 hours as needed for cough.  Use caution as this medication can cause drowsiness. Prednisone 40 mg (2 tablets) once daily for 5 days. Take this in the morning.  This is a  steroid to help with inflammation and pain.  Rest and stay hydrated.   Return to urgent care or PCP if symptoms worsen or fail to resolve.    Final Clinical Impressions(s) / UC Diagnoses   Final diagnoses:  None   Discharge Instructions   None    ED Prescriptions   None    PDMP not reviewed this encounter.   Kreg Pesa, [place] 06/03/23 1742

## 2023-06-03 NOTE — ED Notes (Addendum)
 Ambulated to bathroom

## 2023-06-06 LAB — URINE CULTURE: Culture: >=100000 — AB

## 2023-06-09 ENCOUNTER — Ambulatory Visit (HOSPITAL_COMMUNITY)
Admission: EM | Admit: 2023-06-09 | Discharge: 2023-06-09 | Disposition: A | Discharge status: Home / Self Care | Payer: Self-pay | Attending: Family Medicine | Admitting: Family Medicine

## 2023-06-09 ENCOUNTER — Encounter (HOSPITAL_COMMUNITY): Payer: Self-pay

## 2023-06-09 ENCOUNTER — Ambulatory Visit (INDEPENDENT_AMBULATORY_CARE_PROVIDER_SITE_OTHER): Payer: Self-pay

## 2023-06-09 DIAGNOSIS — R062 Wheezing: Secondary | ICD-10-CM

## 2023-06-09 DIAGNOSIS — R051 Acute cough: Secondary | ICD-10-CM

## 2023-06-09 MED ORDER — AZITHROMYCIN 250 MG PO TABS: 250.0000 mg | ORAL_TABLET | Freq: Every day | ORAL | 0 refills | Status: DC

## 2023-06-09 MED ORDER — ALBUTEROL SULFATE HFA 108 (90 BASE) MCG/ACT IN AERS
1.0000 | INHALATION_SPRAY | Freq: Four times a day (QID) | RESPIRATORY_TRACT | 1 refills | Status: DC | PRN
Start: 2023-06-09 — End: 2023-10-25

## 2023-06-09 NOTE — ED Triage Notes (Signed)
 Patient c/o cough and nasal congestion x 7 days.  Patient states he is not any better after taking medication that were prescribed last week.

## 2023-06-10 NOTE — ED Provider Notes (Signed)
 [facility]   [id number] 06/09/23 Arrival Time: 1348  ASSESSMENT & PLAN:  1. Acute cough   2. Wheezing    I have personally viewed and independently interpreted the imaging studies ordered this visit. CXR: no acute changes or signs of infection.  OTC symptom care as needed. Given duration of symptoms without improvement, begin: Meds ordered this encounter  Medications   albuterol  (VENTOLIN  HFA) 108 (90 Base) MCG/ACT inhaler    Sig: Inhale 1-2 puffs into the lungs every 6 (six) hours as needed for wheezing or shortness of breath.    Dispense:  1 each    Refill:  1   azithromycin  (ZITHROMAX ) 250 MG tablet    Sig: Take 1 tablet (250 mg total) by mouth daily. Take first 2 tablets together, then 1 every day until finished.    Dispense:  6 tablet    Refill:  0     Follow-up Information     [facility].   Specialty: Urgent Care Why: As needed. Contact information: [address] [place]  [phone]                Reviewed expectations re: course of current medical issues. Questions answered. Outlined signs and symptoms indicating need for more acute intervention. Understanding verbalized. After Visit Summary given.   SUBJECTIVE: History from: Patient. Riley Simmons is a 28 y.o. male. Patient c/o cough and nasal congestion x 7 days.  Patient states he is not any better after taking medication that were prescribed last week.  Denies: fever. Normal PO intake without n/v/d. Social History   Tobacco Use  Smoking Status Former   Current packs/day: 0.50   Types: Cigarettes  Smokeless Tobacco Never     OBJECTIVE:  Vitals:   06/09/23 1414  BP: (!) 143/99  Pulse: 80  Resp: 18  Temp: 98.5 F (36.9 C)  TempSrc: Oral  SpO2: 97%    General appearance: alert; no distress Eyes: PERRLA; EOMI; conjunctiva normal HENT: [place]; AT; with mild nasal congestion Neck: supple  Lungs: speaks full  sentences without difficulty; unlabored; clear Extremities: no edema Skin: warm and dry Neurologic: normal gait Psychological: alert and cooperative; normal mood and affect  Labs: Results for orders placed or performed during the hospital encounter of 06/03/23  POC urinalysis dipstick   Collection Time: 06/03/23  4:35 PM  Result Value Ref Range   Color, UA straw (A) yellow   Clarity, UA cloudy (A) clear   Glucose, UA negative negative mg/dL   Bilirubin, UA negative negative   Ketones, POC UA negative negative mg/dL   Spec Grav, UA 4.696 2.952 - 1.025   Blood, UA large (A) negative   pH, UA 6.5 5.0 - 8.0   Protein Ur, POC =30 (A) negative mg/dL   Urobilinogen, UA 1.0 0.2 or 1.0 E.U./dL   Nitrite, UA Positive (A) Negative   Leukocytes, UA Small (1+) (A) Negative  POC Covid19/Flu A&B Antigen   Collection Time: 06/03/23  5:03 PM  Result Value Ref Range   Influenza A Antigen, POC Negative Negative   Influenza B Antigen, POC Negative Negative   Covid Antigen, POC Negative Negative  Urine Culture   Collection Time: 06/03/23  5:16 PM   Specimen: Urine, Clean Catch  Result Value Ref Range   Specimen Description URINE, CLEAN CATCH    Special Requests      NONE Performed at [facility], [address], [place], [place] [address]  Culture >=100,000 COLONIES/mL ESCHERICHIA COLI (A)    Report Status 06/06/2023 FINAL    Organism ID, Bacteria ESCHERICHIA COLI (A)       Susceptibility   Escherichia coli - MIC*    AMPICILLIN >=32 RESISTANT Resistant     CEFAZOLIN <=4 SENSITIVE Sensitive     CEFEPIME <=0.12 SENSITIVE Sensitive     CEFTRIAXONE  <=0.25 SENSITIVE Sensitive     CIPROFLOXACIN  <=0.25 SENSITIVE Sensitive     GENTAMICIN <=1 SENSITIVE Sensitive     IMIPENEM <=0.25 SENSITIVE Sensitive     NITROFURANTOIN  <=16 SENSITIVE Sensitive     TRIMETH /SULFA  >=320 RESISTANT Resistant     AMPICILLIN/SULBACTAM 8 SENSITIVE Sensitive     PIP/TAZO <=4 SENSITIVE Sensitive ug/mL    *  >=100,000 COLONIES/mL ESCHERICHIA COLI   Labs Reviewed - No data to display  Imaging: DG Chest 2 View Result Date: 06/09/2023 CLINICAL DATA:  Cough for a week EXAM: CHEST - 2 VIEW COMPARISON:  Chest x-ray 11/14/2018 FINDINGS: Hyperinflation with chronic left-sided thoracic wall and rib deformities. Left apical pleural thickening is stable. Normal cardiopericardial silhouette. No consolidation, pneumothorax or effusion. No edema. Curvature of the spine. IMPRESSION: No significant interval change.  No consolidation. Electronically Signed   By: Adrianna Horde M.D.   On: 06/09/2023 15:05    Allergies  Allergen Reactions   Levofloxacin Hives, Swelling and Anxiety   Amoxicillin Hives and Rash   Latex Rash    Past Medical History:  Diagnosis Date   History of kidney stones    Marijuana smoker, continuous    Scoliosis    Spina bifida (HCC)    Urethritis    Social History   Socioeconomic History   Marital status: Single    Spouse name: Not on file   Number of children: Not on file   Years of education: Not on file   Highest education level: Not on file  Occupational History   Not on file  Tobacco Use   Smoking status: Former    Current packs/day: 0.50    Types: Cigarettes   Smokeless tobacco: Never  Vaping Use   Vaping status: Never Used  Substance and Sexual Activity   Alcohol use: Not Currently   Drug use: Yes    Types: Marijuana    Comment: daily   Sexual activity: Yes    Birth control/protection: Condom  Other Topics Concern   Not on file  Social History Narrative   Not on file   Social Drivers of Health   Financial Resource Strain: Not on file  Food Insecurity: No Food Insecurity (12/24/2022)   Hunger Vital Sign    Worried About Running Out of Food in the Last Year: Never true    Ran Out of Food in the Last Year: Never true  Transportation Needs: No Transportation Needs (12/24/2022)   PRAPARE - Administrator, Civil Service (Medical): No    Lack of  Transportation (Non-Medical): No  Physical Activity: Not on file  Stress: Not on file  Social Connections: Unknown (06/30/2021)   Received from [facility], [facility]   Social Network    Social Network: Not on file  Intimate Partner Violence: Not At Risk (12/24/2022)   Humiliation, Afraid, Rape, and Kick questionnaire    Fear of Current or Ex-Partner: No    Emotionally Abused: No    Physically Abused: No    Sexually Abused: No   Family History  Problem Relation Age of Onset   Deafness Mother    Past  Surgical History:  Procedure Laterality Date   BACK SURGERY     as a baby due to spina bifida    BLADDER SURGERY     CYSTOSCOPY WITH LITHOLAPAXY N/A 12/24/2020   Procedure: CYSTOSCOPY WITH LITHOLAPAXY;  Surgeon: Geraline Knapp, MD;  Location: [facility];  Service: Urology;  Laterality: N/AAfton Albright, MD 06/10/23 1204

## 2023-08-05 ENCOUNTER — Telehealth: Payer: Self-pay

## 2023-08-05 DIAGNOSIS — N319 Neuromuscular dysfunction of bladder, unspecified: Secondary | ICD-10-CM

## 2023-08-05 MED ORDER — CATHETERS MISC: 1 refills | Status: AC

## 2023-08-05 NOTE — Telephone Encounter (Signed)
 Patient called stating that he has been getting catheter orders from an online company that does not need an order from a provider, patient has been able to order it and pay out of pocket as he does not have insurance right now. His current order has been delayed and will not arrive until tomorrow and he used his last catheter today and needs more catheter to last him until tomorrow. He called [facility] in [place] and they have his catheters 12 french straight colopast catheters supply in the store and can provide him with this supply today.   His LOV was 12/2020, patient is aware he needs to be seen annually, can be with a PA in order for us  to write orders and to stay as our patient for any needs. Patient will have supplemental insurance in 1 month and he will call back to set up an appointment. I spoke with Dr Cherylene Corrente and he approved this order for the patient this one time. Order sent over to [facility] by fax and email ([email or web address]

## 2023-10-25 ENCOUNTER — Emergency Department (HOSPITAL_COMMUNITY)
Admission: EM | Admit: 2023-10-25 | Discharge: 2023-10-25 | Disposition: A | Discharge status: Home / Self Care | Payer: Self-pay | Attending: Emergency Medicine | Admitting: Emergency Medicine

## 2023-10-25 ENCOUNTER — Emergency Department (HOSPITAL_COMMUNITY): Payer: Self-pay

## 2023-10-25 ENCOUNTER — Encounter (HOSPITAL_COMMUNITY): Payer: Self-pay

## 2023-10-25 ENCOUNTER — Other Ambulatory Visit: Payer: Self-pay

## 2023-10-25 ENCOUNTER — Telehealth: Payer: Self-pay | Admitting: Physician Assistant

## 2023-10-25 DIAGNOSIS — R0781 Pleurodynia: Secondary | ICD-10-CM | POA: Insufficient documentation

## 2023-10-25 DIAGNOSIS — R0602 Shortness of breath: Secondary | ICD-10-CM | POA: Insufficient documentation

## 2023-10-25 DIAGNOSIS — Z91040 Latex allergy status: Secondary | ICD-10-CM | POA: Insufficient documentation

## 2023-10-25 DIAGNOSIS — R093 Abnormal sputum: Secondary | ICD-10-CM | POA: Insufficient documentation

## 2023-10-25 DIAGNOSIS — R059 Cough, unspecified: Secondary | ICD-10-CM | POA: Insufficient documentation

## 2023-10-25 DIAGNOSIS — J029 Acute pharyngitis, unspecified: Secondary | ICD-10-CM

## 2023-10-25 LAB — RESP PANEL BY RT-PCR (RSV, FLU A&B, COVID)  RVPGX2
Influenza A by PCR: NEGATIVE
Influenza B by PCR: NEGATIVE
Resp Syncytial Virus by PCR: NEGATIVE
SARS Coronavirus 2 by RT PCR: NEGATIVE

## 2023-10-25 LAB — CBC WITH DIFFERENTIAL/PLATELET
Abs Immature Granulocytes: 0.05 K/uL (ref 0.00–0.07)
Basophils Absolute: 0.1 K/uL (ref 0.0–0.1)
Basophils Relative: 1 %
Eosinophils Absolute: 0.1 K/uL (ref 0.0–0.5)
Eosinophils Relative: 1 %
HCT: 46.4 % (ref 39.0–52.0)
Hemoglobin: 15.4 g/dL (ref 13.0–17.0)
Immature Granulocytes: 0 %
Lymphocytes Relative: 17 %
Lymphs Abs: 2.1 K/uL (ref 0.7–4.0)
MCH: 29.8 pg (ref 26.0–34.0)
MCHC: 33.2 g/dL (ref 30.0–36.0)
MCV: 89.7 fL (ref 80.0–100.0)
Monocytes Absolute: 0.8 K/uL (ref 0.1–1.0)
Monocytes Relative: 7 %
Neutro Abs: 9.3 K/uL — ABNORMAL HIGH (ref 1.7–7.7)
Neutrophils Relative %: 74 %
Platelets: 266 K/uL (ref 150–400)
RBC: 5.17 MIL/uL (ref 4.22–5.81)
RDW: 12.9 % (ref 11.5–15.5)
WBC: 12.4 K/uL — ABNORMAL HIGH (ref 4.0–10.5)
nRBC: 0 % (ref 0.0–0.2)

## 2023-10-25 LAB — BASIC METABOLIC PANEL WITH GFR
Anion gap: 11 (ref 5–15)
BUN: 12 mg/dL (ref 6–20)
CO2: 23 mmol/L (ref 22–32)
Calcium: 8.6 mg/dL — ABNORMAL LOW (ref 8.9–10.3)
Chloride: 105 mmol/L (ref 98–111)
Creatinine, Ser: 1.08 mg/dL (ref 0.61–1.24)
GFR, Estimated: >60 mL/min (ref >60)
Glucose, Bld: 90 mg/dL (ref 70–99)
Potassium: 4.1 mmol/L (ref 3.5–5.1)
Sodium: 139 mmol/L (ref 135–145)

## 2023-10-25 LAB — D-DIMER, QUANTITATIVE: D-Dimer, Quant: <0.27 ug{FEU}/mL (ref 0.00–0.50)

## 2023-10-25 MED ORDER — IBUPROFEN 400 MG PO TABS
600.0000 mg | ORAL_TABLET | Freq: Once | ORAL | Status: AC
  Administered 2023-10-25: 600 mg via ORAL
  Filled 2023-10-25: qty 1

## 2023-10-25 NOTE — ED Triage Notes (Signed)
 Pt report sob since this morning, and pain in the right side of his chest. Pt also reports some blood in his mucous this morning.

## 2023-10-25 NOTE — Progress Notes (Signed)
 We are sorry that you are not feeling well.  Here is how we plan to help!  Your symptoms indicate a likely viral infection (Pharyngitis).   Pharyngitis is inflammation in the back of the throat which can cause a sore throat, scratchiness and sometimes difficulty swallowing.   Pharyngitis is typically caused by a respiratory virus and will just run its course.  Please keep in mind that your symptoms could last up to 10 days.  For throat pain, we recommend over the counter oral pain relief medications such as acetaminophen  or aspirin, or anti-inflammatory medications such as ibuprofen  or naproxen sodium.  Topical treatments such as oral throat lozenges or sprays may be used as needed.  Avoid close contact with loved ones, especially the very young and elderly.  Remember to wash your hands thoroughly throughout the day as this is the number one way to prevent the spread of infection and wipe down door knobs and counters with disinfectant.  After careful review of your answers, I would not recommend and antibiotic for your condition.  Antibiotics should not be used to treat conditions that we suspect are caused by viruses like the virus that causes the common cold or flu. However, some people can have Strep with atypical symptoms. You may need formal testing in clinic or office to confirm if your symptoms continue or worsen.  Providers prescribe antibiotics to treat infections caused by bacteria. Antibiotics are very powerful in treating bacterial infections when they are used properly.  To maintain their effectiveness, they should be used only when necessary.  Overuse of antibiotics has resulted in the development of super bugs that are resistant to treatment!    Home Care: Only take medications as instructed by your medical team. Do not drink alcohol while taking these medications. A steam or ultrasonic humidifier can help congestion.  You can place a towel over your head and breathe in the steam from hot  water  coming from a faucet. Avoid close contacts especially the very young and the elderly. Cover your mouth when you cough or sneeze. Always remember to wash your hands.  Get Help Right Away If: You develop worsening fever or throat pain. You develop a severe head ache or visual changes. Your symptoms persist after you have completed your treatment plan.  Make sure you Understand these instructions. Will watch your condition. Will get help right away if you are not doing well or get worse.  Your e-visit answers were reviewed by a board certified advanced clinical practitioner to complete your personal care plan.  Depending on the condition, your plan could have included both over the counter or prescription medications.  If there is a problem please reply  once you have received a response from your provider.  Your safety is important to us .  If you have drug allergies check your prescription carefully.    You can use MyChart to ask questions about todays visit, request a non-urgent call back, or ask for a work or school excuse for 24 hours related to this e-Visit. If it has been greater than 24 hours you will need to follow up with your provider, or enter a new e-Visit to address those concerns.  You will get an e-mail in the next two days asking about your experience.  I hope that your e-visit has been valuable and will speed your recovery. Thank you for using e-visits.       I have spent 5 minutes in review of e-visit questionnaire, review and  updating patient chart, medical decision making and response to patient.   Delon CHRISTELLA Dickinson, PA-C

## 2023-10-25 NOTE — Discharge Instructions (Addendum)
 Great news your blood clotting test and general blood work looked great.  Use Tylenol  every 4 hours and ibuprofen  every 6 hours as needed for pain. Return for passing out, significant shortness of breath or new concerns.

## 2023-10-25 NOTE — ED Provider Notes (Signed)
 [place] EMERGENCY DEPARTMENT AT [facility] Provider Note   CSN: [id number] Arrival date & time: 10/25/23  1414     Patient presents with: Shortness of Breath and Cough   Riley Simmons is a 28 y.o. male.   Patient presents with shortness of breath and pain with the breath in the right upper lung area just above the clavicle.  Patient denies any new exercises or injuries.  Patient denies classic risk factors for blood clots.  Pain primarily when taking a deep breath.  No fevers or significant cough but did have mild blood in the mucus this morning.  No family history of clotting disorders.  The history is provided by the patient.  Shortness of Breath Associated symptoms: no abdominal pain, no chest pain, no cough, no fever, no headaches, no neck pain, no rash and no vomiting   Cough Associated symptoms: shortness of breath   Associated symptoms: no chest pain, no chills, no fever, no headaches and no rash        Prior to Admission medications   Medication Sig Start Date End Date Taking? Authorizing Provider  Catheters MISC Order for 12 FR straight tip coloplast catheters to catheterize 7 to 10 times a day 08/05/23   Twylla Glendia BROCKS, MD    Allergies: Levofloxacin, Amoxicillin, and Latex    Review of Systems  Constitutional:  Negative for chills and fever.  HENT:  Negative for congestion.   Eyes:  Negative for visual disturbance.  Respiratory:  Positive for shortness of breath. Negative for cough.   Cardiovascular:  Negative for chest pain.  Gastrointestinal:  Negative for abdominal pain and vomiting.  Genitourinary:  Negative for dysuria and flank pain.  Musculoskeletal:  Negative for back pain, neck pain and neck stiffness.  Skin:  Negative for rash.  Neurological:  Negative for light-headedness and headaches.    Updated Vital Signs BP (!) 132/103 (BP Location: Right Arm)   Pulse 79   Temp 98.2 F (36.8 C) (Oral)   Resp 16   SpO2 98%   Physical  Exam Vitals and nursing note reviewed.  Constitutional:      General: He is not in acute distress.    Appearance: He is well-developed.  HENT:     Head: Normocephalic and atraumatic.     Mouth/Throat:     Mouth: Mucous membranes are moist.  Eyes:     General:        Right eye: No discharge.        Left eye: No discharge.     Conjunctiva/sclera: Conjunctivae normal.  Neck:     Trachea: No tracheal deviation.  Cardiovascular:     Rate and Rhythm: Normal rate and regular rhythm.     Heart sounds: No murmur heard. Pulmonary:     Effort: Pulmonary effort is normal.     Breath sounds: Normal breath sounds.  Abdominal:     General: There is no distension.     Palpations: Abdomen is soft.     Tenderness: There is no abdominal tenderness. There is no guarding.  Musculoskeletal:     Cervical back: Normal range of motion and neck supple. No rigidity.     Right lower leg: No tenderness. No edema.     Left lower leg: No tenderness. No edema.  Skin:    General: Skin is warm.     Capillary Refill: Capillary refill takes less than 2 seconds.     Findings: No rash.  Neurological:  General: No focal deficit present.     Mental Status: He is alert.     Cranial Nerves: No cranial nerve deficit.  Psychiatric:        Mood and Affect: Mood normal.     (all labs ordered are listed, but only abnormal results are displayed) Labs Reviewed  BASIC METABOLIC PANEL WITH GFR - Abnormal; Notable for the following components:      Result Value   Calcium 8.6 (*)    All other components within normal limits  CBC WITH DIFFERENTIAL/PLATELET - Abnormal; Notable for the following components:   WBC 12.4 (*)    Neutro Abs 9.3 (*)    All other components within normal limits  RESP PANEL BY RT-PCR (RSV, FLU A&B, COVID)  RVPGX2  D-DIMER, QUANTITATIVE    EKG: None  Radiology: DG Chest 2 View Result Date: 10/25/2023 CLINICAL DATA:  Short of breath, right-sided chest pain EXAM: CHEST - 2 VIEW  COMPARISON:  06/09/2023 FINDINGS: Frontal and lateral views of the chest demonstrate a stable cardiac silhouette. No acute airspace disease, effusion, or pneumothorax. No acute displaced fracture. Chronic left-sided rib deformities. IMPRESSION: 1. No acute intrathoracic process. Electronically Signed   By: Ozell Daring M.D.   On: 10/25/2023 15:08     Procedures   Medications Ordered in the ED  ibuprofen  (ADVIL ) tablet 600 mg (600 mg Oral Given 10/25/23 1700)                                    Medical Decision Making Amount and/or Complexity of Data Reviewed Labs: ordered. Radiology: ordered.   Patient presents with mild right upper lung pleuritic discomfort without injuries.  Patient's well-appearing normal heart rate normal work of breathing normal oxygenation.  No fever or productive cough.  Patient had mild blood-tinged sputum difficult to discern if from nose versus hemoptysis.  Patient low risk blood clot, D-dimer ordered and general blood work.  Patient comfortable this plan.  Ibuprofen  ordered for pain.  Differential also includes viral respiratory infection.  D-dimer reviewed result negative, general blood work no anemia, minimal elevated white blood cell count 12.4, electrolytes unremarkable.    Patient stable for discharge with outpatient follow-up.     Final diagnoses:  Pleuritic chest pain    ED Discharge Orders     None          Tonia Chew, MD 10/25/23 1844

## 2023-10-25 NOTE — ED Triage Notes (Signed)
 Pt presents for SOB, does not appear in distress.

## 2024-02-04 ENCOUNTER — Encounter (HOSPITAL_BASED_OUTPATIENT_CLINIC_OR_DEPARTMENT_OTHER): Payer: Self-pay

## 2024-02-04 ENCOUNTER — Other Ambulatory Visit: Payer: Self-pay

## 2024-02-04 ENCOUNTER — Emergency Department (HOSPITAL_BASED_OUTPATIENT_CLINIC_OR_DEPARTMENT_OTHER): Payer: Self-pay | Admitting: Radiology

## 2024-02-04 ENCOUNTER — Emergency Department (HOSPITAL_BASED_OUTPATIENT_CLINIC_OR_DEPARTMENT_OTHER)
Admission: EM | Admit: 2024-02-04 | Discharge: 2024-02-04 | Disposition: A | Discharge status: Home / Self Care | Payer: Self-pay | Attending: Emergency Medicine | Admitting: Emergency Medicine

## 2024-02-04 DIAGNOSIS — N12 Tubulo-interstitial nephritis, not specified as acute or chronic: Secondary | ICD-10-CM | POA: Insufficient documentation

## 2024-02-04 DIAGNOSIS — Z91040 Latex allergy status: Secondary | ICD-10-CM | POA: Insufficient documentation

## 2024-02-04 LAB — TROPONIN T, HIGH SENSITIVITY: Troponin T High Sensitivity: <15 ng/L (ref 0–19)

## 2024-02-04 LAB — URINALYSIS, ROUTINE W REFLEX MICROSCOPIC
Bilirubin Urine: NEGATIVE
Glucose, UA: NEGATIVE mg/dL
Ketones, ur: NEGATIVE mg/dL
Nitrite: POSITIVE — AB
Protein, ur: 30 mg/dL — AB
Specific Gravity, Urine: 1.021 (ref 1.005–1.030)
WBC, UA: >50 WBC/hpf (ref 0–5)
pH: 6.5 (ref 5.0–8.0)

## 2024-02-04 LAB — COMPREHENSIVE METABOLIC PANEL WITH GFR
ALT: 9 U/L (ref 0–44)
AST: 15 U/L (ref 15–41)
Albumin: 4.5 g/dL (ref 3.5–5.0)
Alkaline Phosphatase: 90 U/L (ref 38–126)
Anion gap: 11 (ref 5–15)
BUN: 12 mg/dL (ref 6–20)
CO2: 24 mmol/L (ref 22–32)
Calcium: 9.2 mg/dL (ref 8.9–10.3)
Chloride: 105 mmol/L (ref 98–111)
Creatinine, Ser: 1.02 mg/dL (ref 0.61–1.24)
GFR, Estimated: >60 mL/min
Glucose, Bld: 100 mg/dL — ABNORMAL HIGH (ref 70–99)
Potassium: 4.4 mmol/L (ref 3.5–5.1)
Sodium: 140 mmol/L (ref 135–145)
Total Bilirubin: 0.5 mg/dL (ref 0.0–1.2)
Total Protein: 7.1 g/dL (ref 6.5–8.1)

## 2024-02-04 LAB — CBC WITH DIFFERENTIAL/PLATELET
Abs Immature Granulocytes: 0.02 K/uL (ref 0.00–0.07)
Basophils Absolute: 0.1 K/uL (ref 0.0–0.1)
Basophils Relative: 1 %
Eosinophils Absolute: 0.1 K/uL (ref 0.0–0.5)
Eosinophils Relative: 1 %
HCT: 43.4 % (ref 39.0–52.0)
Hemoglobin: 15.4 g/dL (ref 13.0–17.0)
Immature Granulocytes: 0 %
Lymphocytes Relative: 23 %
Lymphs Abs: 1.5 K/uL (ref 0.7–4.0)
MCH: 30.5 pg (ref 26.0–34.0)
MCHC: 35.5 g/dL (ref 30.0–36.0)
MCV: 85.9 fL (ref 80.0–100.0)
Monocytes Absolute: 0.8 K/uL (ref 0.1–1.0)
Monocytes Relative: 13 %
Neutro Abs: 4.1 K/uL (ref 1.7–7.7)
Neutrophils Relative %: 62 %
Platelets: 242 K/uL (ref 150–400)
RBC: 5.05 MIL/uL (ref 4.22–5.81)
RDW: 12.9 % (ref 11.5–15.5)
WBC: 6.4 K/uL (ref 4.0–10.5)
nRBC: 0 % (ref 0.0–0.2)

## 2024-02-04 MED ORDER — CEFPODOXIME PROXETIL 200 MG PO TABS: 200.0000 mg | ORAL_TABLET | Freq: Two times a day (BID) | ORAL | 0 refills | Status: AC

## 2024-02-04 MED ORDER — SODIUM CHLORIDE 0.9 % IV SOLN: INTRAVENOUS | Status: DC | PRN

## 2024-02-04 MED ORDER — SODIUM CHLORIDE 0.9 % IV SOLN
2.0000 g | Freq: Once | INTRAVENOUS | Status: AC
  Administered 2024-02-04: 2 g via INTRAVENOUS
  Filled 2024-02-04: qty 20

## 2024-02-04 NOTE — ED Provider Notes (Cosign Needed)
 " [place] EMERGENCY DEPARTMENT AT [facility] Provider Note   CSN: [id number] Arrival date & time: 02/04/24  1111     Patient presents with: Flank Pain (Left /) and Chest Tightness   Riley Simmons is a 28 y.o. male with history of spina bifida who self catheterizes, who presents with concern for a urinary tract infection.  He states that he has had foul-smelling urine and bilateral flank pain for about 1 week.  Denies any fever or chills.  Does report some suprapubic abdominal discomfort.  Reports he is sexually active, but denies any concern for STIs.  He also reports that he has had some chest tightness that began last night.  Reports this is intermittent, but is nonexertional and nonpleuritic.  Denies any associated shortness of breath.  No cough, fever, chills, sore throat.  Denies any recent long plane or car rides, recent hospitalizations or surgeries, or any history of a blood clot.    Flank Pain       Prior to Admission medications  Medication Sig Start Date End Date Taking? Authorizing Provider  cefpodoxime (VANTIN) 200 MG tablet Take 1 tablet (200 mg total) by mouth 2 (two) times daily. 02/05/24  Yes Veta Palma, PA-C  Catheters MISC Order for 12 FR straight tip coloplast catheters to catheterize 7 to 10 times a day 08/05/23   Twylla Glendia BROCKS, MD    Allergies: Levofloxacin, Amoxicillin, and Latex    Review of Systems  Genitourinary:  Positive for flank pain.    Updated Vital Signs BP 126/86 (BP Location: Left Arm)   Pulse 67   Temp 97.8 F (36.6 C)   Resp 18   Ht 5' 6 (1.676 m)   Wt 81.6 kg   SpO2 100%   BMI 29.05 kg/m   Physical Exam Vitals and nursing note reviewed.  Constitutional:      General: He is not in acute distress.    Appearance: He is well-developed.     Comments: Moves around in the stretcher without difficulty  HENT:     Head: Normocephalic and atraumatic.  Eyes:     Conjunctiva/sclera: Conjunctivae normal.   Cardiovascular:     Rate and Rhythm: Normal rate and regular rhythm.     Heart sounds: No murmur heard. Pulmonary:     Effort: Pulmonary effort is normal. No respiratory distress.     Breath sounds: Normal breath sounds.  Abdominal:     Palpations: Abdomen is soft.     Tenderness: There is abdominal tenderness.     Comments: Suprapubic abdominal tenderness and left sided CVA tenderness  Musculoskeletal:        General: No swelling.     Cervical back: Neck supple.  Skin:    General: Skin is warm and dry.     Capillary Refill: Capillary refill takes less than 2 seconds.  Neurological:     Mental Status: He is alert.  Psychiatric:        Mood and Affect: Mood normal.     (all labs ordered are listed, but only abnormal results are displayed) Labs Reviewed  URINALYSIS, ROUTINE W REFLEX MICROSCOPIC - Abnormal; Notable for the following components:      Result Value   APPearance HAZY (*)    Hgb urine dipstick SMALL (*)    Protein, ur 30 (*)    Nitrite POSITIVE (*)    Leukocytes,Ua LARGE (*)    Bacteria, UA MANY (*)    All other components within normal limits  COMPREHENSIVE METABOLIC PANEL WITH GFR - Abnormal; Notable for the following components:   Glucose, Bld 100 (*)    All other components within normal limits  URINE CULTURE  CBC WITH DIFFERENTIAL/PLATELET  TROPONIN T, HIGH SENSITIVITY    EKG: None  Radiology: DG Chest 2 View Result Date: 02/04/2024 EXAM: 2 VIEW(S) XRAY OF THE CHEST 02/04/2024 01:32:57 PM COMPARISON: 10/25/2023 CLINICAL HISTORY: chest pain FINDINGS: LUNGS AND PLEURA: No focal pulmonary opacity. No pleural effusion. No pneumothorax. HEART AND MEDIASTINUM: No acute abnormality of the cardiac and mediastinal silhouettes. BONES AND SOFT TISSUES: Stable deformity of left chest wall. Remote bilateral rib fractures. S-shaped curvature of thoracolumbar spine. IMPRESSION: 1. No acute findings. Electronically signed by: Greig Pique MD 02/04/2024 03:20 PM EST RP  Workstation: [id number]     Procedures   Medications Ordered in the ED  0.9 %  sodium chloride  infusion (0 mLs Intravenous Stopped 02/04/24 1546)  cefTRIAXone  (ROCEPHIN ) 2 g in sodium chloride  0.9 % 100 mL IVPB (0 g Intravenous Stopped 02/04/24 1411)                HEART Score: 0                    Medical Decision Making Amount and/or Complexity of Data Reviewed Labs: ordered. Radiology: ordered.  Risk Prescription drug management.     Differential diagnosis includes but is not limited to UTI, pyelonephritis, nephrolithiasis, STI, orchitis, epididymitis, prostatitis, ACS, arrhythmia, aortic aneurysm, pericarditis, myocarditis, pericardial effusion, cardiac tamponade, musculoskeletal pain, GERD, Boerhaave's syndrome, DVT/PE, pneumonia, pleural effusion   ED Course:  Upon initial evaluation, patient is very well-appearing, no acute distress.  Normal vital signs.  Mild suprapubic abdominal tenderness palpation and also left-sided CVA tenderness.  Lungs clear to auscultation.  Regular rate and rhythm on cardiac auscultation   Labs Ordered: I Ordered, and personally interpreted labs.  The pertinent results include:   CBC within normal limits, no leukocytosis CMP within normal limits Urinalysis with leukocytes and nitrites.  RBCs also present.  Appears to be clean-catch. Troponin within normal limits  Imaging Studies ordered: I ordered imaging studies including chest x-ray I independently visualized the imaging with scope of interpretation limited to determining acute life threatening conditions related to emergency care. Imaging showed no acute abnormalities I agree with the radiologist interpretation   Cardiac Monitoring: / EKG: The patient was maintained on a cardiac monitor.  I personally viewed and interpreted the cardiac monitored which showed an underlying rhythm of: Normal sinus rhythm  Medications Given: Ceftriaxone   Upon re-evaluation, patient remains  well-appearing with stable vitals.   Low concern for ACS at this time given troponin within normal limits and pain started last night, pain non-exertional, and EKG with normal sinus rhythm and no ST changes. HEART score of 0. Chest x-ray without any acute abnormality. Low concern for DVT or PE at this time given PERC negative. Unclear as to the cause of his chest pain, but low concern for emergent pathology given reassuring labs, vitals, and EKG/chest x-ray. His urinalysis does appear infected.  Positive for nitrates and leukocytes, and appears to be clean-catch.  Patient does self catheterize due to his spina bifida and neurogenic bladder.  He is allergic to amoxicillin and levofloxacin.  His most recent urine culture showed resistance to Bactrim .  Given these considerations, he was given a dose of IV ceftriaxone  here and we will discharge home with course of cefpodoxime  to cover for pyelonephritis.  No leukocytosis, fever, or tachycardia to  suggest sepsis.  Patient stable and appropriate for discharge home    Impression: Pyelonephritis  Disposition:  The patient was discharged home with instructions to take 10-day course of cefpodoxime  as prescribed for treatment of pyelonephritis will avoid Bactrim  at this time given resistance to prior urine culture, and also need to avoid levofloxacin due to allergy.  Follow-up with PCP if symptoms not improving within the next 5 days Return precautions given and patient verbalized understanding.    Record Review: External records from outside source obtained and reviewed including urgent care note from 06/03/2023 where he was seen for acute cystitis and started on cefadroxil .  Patient states he tolerated this medication well.  Urine culture from this visit showed E. coli only resistant to ampicillin and Bactrim .  Sensitive to ceftriaxone      This chart was dictated using voice recognition software, Dragon. Despite the best efforts of this provider to  proofread and correct errors, errors may still occur which can change documentation meaning.       Final diagnoses:  Pyelonephritis    ED Discharge Orders          Ordered    cefpodoxime  (VANTIN ) 200 MG tablet  2 times daily        02/04/24 1537               Veta Palma, [place] 02/04/24 1546  "

## 2024-02-04 NOTE — ED Notes (Signed)
 Lab called to add on urine culture.  ?

## 2024-02-04 NOTE — Discharge Instructions (Addendum)
 You were found to have a urinary tract infection that is also involving your kidneys. This is called pyelonephritis. Your urine has been sent off for culture to see what bacteria is in your urine. If your antibiotic needs to be changed, you will be contacted.  Your heart testing was normal today, no signs of a heart attack. Your EKG which shows the heart's rhythm also was normal.  Your blood counts and electrolytes are normal today.  Your kidney function is normal.  Medications: You have been prescribed an antibiotic called Cefpodoxime. Take this antibiotic 2 times a day for the next 10 days starting tomorrow morning.  You received today's dose of antibiotic through your IV today.  Take the full course of your antibiotic even if you start feeling better. Antibiotics may cause you to have diarrhea.  Follow-up instructions: Please follow-up with your primary care provider if symptoms are not improving within the next 5 days.  Return instructions:  Please return to the Emergency Department if you: Develop confusion or become poorly responsive or faint Develop a fever above 100.66F Worsening pain You have persistent vomiting and/or are unable to keep medications down Please return if you have any other emergent concerns.

## 2024-02-04 NOTE — ED Triage Notes (Signed)
 Patient arrives POV with complaints of left side back/flank pain x1 week. Also reports some chest tightness as well.      Hx of Spina bifida and recurrent bladder infections/UTI's

## 2024-02-07 LAB — URINE CULTURE: Culture: >=100000 — AB

## 2024-02-08 ENCOUNTER — Telehealth (HOSPITAL_BASED_OUTPATIENT_CLINIC_OR_DEPARTMENT_OTHER): Payer: Self-pay | Admitting: *Deleted

## 2024-02-08 NOTE — Telephone Encounter (Signed)
 Post ED Visit - Positive Culture Follow-up  Culture report reviewed by antimicrobial stewardship pharmacist: [facility] [x]  Leonor Bash, [place].D. []  Venetia Gully, Pharm.D., BCPS AQ-ID []  Garrel Crews, Pharm.D., BCPS []  Almarie Lunger, [address].D., BCPS []  [place], [place].D., BCPS, AAHIVP []  Rosaline Bihari, Pharm.D., BCPS, AAHIVP []  Vernell Meier, PharmD, BCPS []  Latanya Hint, PharmD, BCPS []  Donald Medley, PharmD, BCPS []  Rocky Bold, PharmD []  Dorothyann Alert, PharmD, BCPS []  Morene Babe, PharmD  [facility] []  Rosaline Edison, PharmD []  Romona Bliss, PharmD []  Dolphus Roller, PharmD []  Veva Seip, Rph []  Vernell Daunt) Leonce, PharmD []  Eva Allis, PharmD []  Rosaline Millet, PharmD []  Iantha Batch, PharmD []  Arvin Gauss, PharmD []  Wanda Hasting, PharmD []  Ronal Rav, PharmD []  Rocky Slade, PharmD []  Bard Jeans, PharmD   Positive urine culture Treated with Cefpodoxime  Proxetil, organism sensitive to the same and no further patient follow-up is required at this time.  Jama Lisle Blondie 02/08/2024, 12:02 PM
